# Patient Record
Sex: Female | Born: 1955 | Hispanic: No
Health system: Southern US, Community
[De-identification: ages and names within clinical notes are randomized; demographics above are authoritative.]

## PROBLEM LIST (undated history)

## (undated) DIAGNOSIS — M1990 Unspecified osteoarthritis, unspecified site: Secondary | ICD-10-CM

## (undated) DIAGNOSIS — G8929 Other chronic pain: Secondary | ICD-10-CM

## (undated) DIAGNOSIS — S0300XA Dislocation of jaw, unspecified side, initial encounter: Secondary | ICD-10-CM

## (undated) DIAGNOSIS — K219 Gastro-esophageal reflux disease without esophagitis: Secondary | ICD-10-CM

## (undated) DIAGNOSIS — I671 Cerebral aneurysm, nonruptured: Secondary | ICD-10-CM

## (undated) DIAGNOSIS — E7800 Pure hypercholesterolemia, unspecified: Secondary | ICD-10-CM

## (undated) DIAGNOSIS — I639 Cerebral infarction, unspecified: Secondary | ICD-10-CM

## (undated) DIAGNOSIS — I219 Acute myocardial infarction, unspecified: Secondary | ICD-10-CM

## (undated) HISTORY — PX: CEREBRAL ANEURYSM REPAIR: SHX164

---

## 2004-10-14 ENCOUNTER — Emergency Department: Payer: Self-pay | Admitting: Internal Medicine

## 2004-11-26 ENCOUNTER — Other Ambulatory Visit: Payer: Self-pay

## 2004-11-26 ENCOUNTER — Emergency Department: Payer: Self-pay | Admitting: Emergency Medicine

## 2005-01-05 ENCOUNTER — Ambulatory Visit: Payer: Self-pay

## 2005-03-30 ENCOUNTER — Ambulatory Visit: Payer: Self-pay | Admitting: Obstetrics and Gynecology

## 2005-04-05 ENCOUNTER — Inpatient Hospital Stay: Payer: Self-pay | Admitting: Obstetrics and Gynecology

## 2006-05-16 ENCOUNTER — Emergency Department: Payer: Self-pay | Admitting: Emergency Medicine

## 2006-06-09 ENCOUNTER — Emergency Department: Payer: Self-pay | Admitting: Emergency Medicine

## 2006-08-11 ENCOUNTER — Ambulatory Visit: Payer: Self-pay | Admitting: Unknown Physician Specialty

## 2007-01-18 ENCOUNTER — Emergency Department: Payer: Self-pay | Admitting: Internal Medicine

## 2007-01-18 ENCOUNTER — Other Ambulatory Visit: Payer: Self-pay

## 2007-04-13 ENCOUNTER — Emergency Department: Payer: Self-pay | Admitting: Unknown Physician Specialty

## 2007-05-16 ENCOUNTER — Ambulatory Visit: Payer: Self-pay | Admitting: Family Medicine

## 2007-05-25 ENCOUNTER — Ambulatory Visit: Payer: Self-pay | Admitting: Family Medicine

## 2008-04-17 ENCOUNTER — Ambulatory Visit: Payer: Self-pay | Admitting: General Surgery

## 2008-05-16 ENCOUNTER — Emergency Department: Payer: Self-pay | Admitting: Emergency Medicine

## 2008-09-16 ENCOUNTER — Emergency Department: Payer: Self-pay | Admitting: Emergency Medicine

## 2008-10-11 ENCOUNTER — Emergency Department: Payer: Self-pay | Admitting: Emergency Medicine

## 2008-10-13 ENCOUNTER — Emergency Department: Payer: Self-pay | Admitting: Emergency Medicine

## 2009-04-22 ENCOUNTER — Emergency Department: Payer: Self-pay | Admitting: Emergency Medicine

## 2009-06-21 ENCOUNTER — Emergency Department: Payer: Self-pay | Admitting: Emergency Medicine

## 2010-01-27 ENCOUNTER — Ambulatory Visit: Payer: Self-pay | Admitting: Family Medicine

## 2010-03-18 ENCOUNTER — Ambulatory Visit: Payer: Self-pay | Admitting: Gastroenterology

## 2010-03-23 ENCOUNTER — Encounter: Payer: Self-pay | Admitting: Internal Medicine

## 2010-03-27 ENCOUNTER — Encounter: Payer: Self-pay | Admitting: Internal Medicine

## 2010-04-10 ENCOUNTER — Emergency Department: Payer: Self-pay | Admitting: Emergency Medicine

## 2010-04-26 ENCOUNTER — Encounter: Payer: Self-pay | Admitting: Internal Medicine

## 2010-05-27 ENCOUNTER — Encounter: Payer: Self-pay | Admitting: Internal Medicine

## 2011-02-10 ENCOUNTER — Ambulatory Visit: Payer: Self-pay | Admitting: Family Medicine

## 2012-05-02 ENCOUNTER — Ambulatory Visit: Payer: Self-pay | Admitting: Internal Medicine

## 2012-12-02 ENCOUNTER — Ambulatory Visit: Payer: Self-pay | Admitting: Internal Medicine

## 2012-12-02 LAB — VANCOMYCIN, TROUGH: Vancomycin, Trough: <1 ug/mL — ABNORMAL LOW (ref 10–20)

## 2013-06-15 ENCOUNTER — Ambulatory Visit: Payer: Self-pay | Admitting: Internal Medicine

## 2013-06-22 ENCOUNTER — Emergency Department: Payer: Self-pay | Admitting: Emergency Medicine

## 2014-08-15 ENCOUNTER — Ambulatory Visit: Payer: Self-pay | Admitting: Internal Medicine

## 2015-03-13 ENCOUNTER — Emergency Department: Payer: Self-pay | Admitting: Emergency Medicine

## 2015-12-25 ENCOUNTER — Other Ambulatory Visit: Payer: Self-pay | Admitting: Internal Medicine

## 2015-12-25 DIAGNOSIS — Z1231 Encounter for screening mammogram for malignant neoplasm of breast: Secondary | ICD-10-CM

## 2016-01-06 ENCOUNTER — Ambulatory Visit: Payer: Self-pay | Attending: Internal Medicine

## 2016-01-23 ENCOUNTER — Ambulatory Visit
Admission: RE | Admit: 2016-01-23 | Discharge: 2016-01-23 | Disposition: A | Discharge status: Home / Self Care | Payer: Medicare Other | Source: Ambulatory Visit | Attending: Internal Medicine | Admitting: Internal Medicine

## 2016-01-23 DIAGNOSIS — Z1231 Encounter for screening mammogram for malignant neoplasm of breast: Secondary | ICD-10-CM

## 2016-12-14 ENCOUNTER — Other Ambulatory Visit: Payer: Self-pay | Admitting: Internal Medicine

## 2016-12-14 DIAGNOSIS — Z1231 Encounter for screening mammogram for malignant neoplasm of breast: Secondary | ICD-10-CM

## 2017-01-24 ENCOUNTER — Ambulatory Visit
Admission: RE | Admit: 2017-01-24 | Discharge: 2017-01-24 | Disposition: A | Discharge status: Home / Self Care | Payer: Medicare Other | Source: Ambulatory Visit | Attending: Internal Medicine | Admitting: Internal Medicine

## 2017-01-24 DIAGNOSIS — Z1231 Encounter for screening mammogram for malignant neoplasm of breast: Secondary | ICD-10-CM | POA: Diagnosis present

## 2017-08-23 ENCOUNTER — Emergency Department
Admission: EM | Admit: 2017-08-23 | Discharge: 2017-08-23 | Disposition: A | Discharge status: Home / Self Care | Payer: Medicare Other | Attending: Emergency Medicine | Admitting: Emergency Medicine

## 2017-08-23 ENCOUNTER — Emergency Department: Payer: Medicare Other

## 2017-08-23 ENCOUNTER — Encounter: Payer: Self-pay | Admitting: Emergency Medicine

## 2017-08-23 DIAGNOSIS — M79662 Pain in left lower leg: Secondary | ICD-10-CM | POA: Diagnosis present

## 2017-08-23 DIAGNOSIS — M79605 Pain in left leg: Secondary | ICD-10-CM

## 2017-08-23 DIAGNOSIS — M5432 Sciatica, left side: Secondary | ICD-10-CM | POA: Insufficient documentation

## 2017-08-23 HISTORY — DX: Pure hypercholesterolemia, unspecified: E78.00

## 2017-08-23 HISTORY — DX: Cerebral infarction, unspecified: I63.9

## 2017-08-23 HISTORY — DX: Cerebral aneurysm, nonruptured: I67.1

## 2017-08-23 HISTORY — DX: Other chronic pain: G89.29

## 2017-08-23 HISTORY — DX: Dislocation of jaw, unspecified side, initial encounter: S03.00XA

## 2017-08-23 HISTORY — DX: Unspecified osteoarthritis, unspecified site: M19.90

## 2017-08-23 MED ORDER — HYDROCODONE-ACETAMINOPHEN 5-325 MG PO TABS: 1.0000 | ORAL_TABLET | Freq: Four times a day (QID) | ORAL | 0 refills | Status: DC | PRN

## 2017-08-23 MED ORDER — PREDNISONE 10 MG (21) PO TBPK: ORAL_TABLET | ORAL | 0 refills | Status: DC

## 2017-08-23 MED ORDER — DEXAMETHASONE SODIUM PHOSPHATE 10 MG/ML IJ SOLN
10.0000 mg | Freq: Once | INTRAMUSCULAR | Status: AC
  Administered 2017-08-23: 10 mg via INTRAMUSCULAR
  Filled 2017-08-23: qty 1

## 2017-08-23 NOTE — ED Notes (Signed)
Pt alert and oriented X4, active, cooperative, pt in NAD. RR even and unlabored, color WNL.  Pt informed to return if any life threatening symptoms occur.   

## 2017-08-23 NOTE — Discharge Instructions (Signed)
Take medication as prescribed. Return to emergency department if symptoms worsen and follow-up with PCP as needed.   °

## 2017-08-23 NOTE — ED Triage Notes (Signed)
Pt c/o left knee/foot pain.  Has chronic pain to this area and missed appt in [place] r/t transportation.  Pain has been worse than normal for last 10 days.  Has "degnerative joint problems in this leg" per pt.  Ambulatory, declined wheelchair.  Pain is worse when walking.

## 2017-08-23 NOTE — ED Provider Notes (Signed)
[facility] Emergency Department Provider Note   ____________________________________________   I have reviewed the triage vital signs and the nursing notes.   HISTORY  Chief Complaint Leg Pain    HPI Hannah Martinez is a 61 y.o. female presents to the emergency department with left upper extremity pain beginning at the knee radiating down to the foot that began approximately 10 days ago. Patient is normally seen at [facility] orthopedics however. her transportation arrangements were not schedule appropriately and she missed the appointment. Reschedule appointment is for October however she was unable to wait so she came to the emergency department this morning. Patient reports pain in numbing sensation along the knee and lower leg with cramping sensation along the plantar aspect of the foot. Patient reports family history of clotting disorders and DVT. Patient has history of stroke and 2003 and does not take blood thinners or aspirin. Patient utilizes a single-point cane for mobility. Patient denies fever, chills, headache, vision changes, chest pain, chest tightness, shortness of breath, abdominal pain, nausea and vomiting.  Past Medical History:  Diagnosis Date  . Cerebral aneurysm   . Chronic pain   . Degenerative joint disease   . Hypercholesteremia   . Stroke (HCC)   . TMJ (dislocation of temporomandibular joint)     There are no active problems to display for this patient.   Past Surgical History:  Procedure Laterality Date  . CEREBRAL ANEURYSM REPAIR      Prior to Admission medications   Medication Sig Start Date End Date Taking? Authorizing Provider  HYDROcodone-acetaminophen (NORCO/VICODIN) 5-325 MG tablet Take 1 tablet by mouth every 6 (six) hours as needed for moderate pain. 08/23/17   Sanuel Ladnier M, PA-C  predniSONE (STERAPRED UNI-PAK 21 TAB) 10 MG (21) TBPK tablet Take 6 tablets on day 1. Take 5 tablets on day 2. Take 4 tablets on day 3. Take  3 tablets on day 4. Take 2 tablets on day 5. Take 1 tablets on day 6. 08/23/17   Amyri Frenz M, PA-C    Allergies Lipitor [atorvastatin]  Family History  Problem Relation Age of Onset  . Breast cancer Neg Hx     Social History Social History  Substance Use Topics  . Smoking status: Never Smoker  . Smokeless tobacco: Never Used  . Alcohol use No    Review of Systems Constitutional: Negative for fever/chills Eyes: No visual changes. ENT:  Negative for sore throat and for difficulty swallowing Cardiovascular: Denies chest pain. Respiratory: Denies cough. Denies shortness of breath. Gastrointestinal: No abdominal pain.  No nausea, vomiting, diarrhea. Genitourinary: Negative for dysuria. Musculoskeletal: Positive for left knee and lower leg pain radiating to the plantar aspect of the foot with cramping along the plantar aspect of the foot. Skin: Negative for rash. Neurological: Negative for headaches.  Negative focal weakness or numbness. Negative for loss of consciousness. Able to ambulate. ____________________________________________   PHYSICAL EXAM:  VITAL SIGNS: ED Triage Vitals  Enc Vitals Group     BP 08/23/17 0921 (!) 158/74     Pulse Rate 08/23/17 0921 64     Resp 08/23/17 0921 16     Temp 08/23/17 0918 97.7 F (36.5 C)     Temp Source 08/23/17 0918 Oral     SpO2 08/23/17 0921 99 %     Weight 08/23/17 0919 155 lb (70.3 kg)     Height 08/23/17 0919 5\' 5"  (1.651 m)     Head Circumference --  Peak Flow --      Pain Score 08/23/17 0918 10     Pain Loc --      Pain Edu? --      Excl. in GC? --     Constitutional: Alert and oriented. Well appearing and in no acute distress.  Eyes: Conjunctivae are normal. PERRL. EOMI  Head: Normocephalic and atraumatic. ENT:      Ears: Canals clear. TMs intact bilaterally.      Nose: No congestion/rhinnorhea.      Mouth/Throat: Mucous membranes are moist.  Neck:Supple. No thyromegaly. No stridor.  Cardiovascular:  Normal rate, regular rhythm. Normal S1 and S2.  Good peripheral circulation. Respiratory: Normal respiratory effort without tachypnea or retractions. Lungs CTAB. No wheezes/rales/rhonchi. Good air entry to the bases with no decreased or absent breath sounds. Hematological/Lymphatic/Immunological: No cervical lymphadenopathy. Cardiovascular: Normal rate, regular rhythm. Normal distal pulses. Gastrointestinal: Bowel sounds 4 quadrants. Soft and nontender to palpation.  Musculoskeletal: Left lower leg pain from left knee to the foot. Intact sensation however altered. Patient reported sharp/dull sensation assessment, the left lower leg has less sensation compared to the right lower leg. Intact strength of the left lower extremity and no gait instability.  Neurologic: Normal speech and language. No gross focal neurologic deficits are appreciated. No gait instability. .  Skin:  Skin is warm, dry and intact. No rash noted. Psychiatric: Mood and affect are normal. Speech and behavior are normal. Patient exhibits appropriate insight and judgement.  ____________________________________________   LABS (all labs ordered are listed, but only abnormal results are displayed)  Labs Reviewed - No data to display ____________________________________________  EKG none ____________________________________________  RADIOLOGY US venous IMG lower unilateral left IMPRESSION: No evidence of DVT within the LEFT lower extremity. ____________________________________________   PROCEDURES  Procedure(s) performed: no    Critical Care performed: no ____________________________________________   INITIAL IMPRESSION / ASSESSMENT AND PLAN / ED COURSE  Pertinent labs & imaging results that were available during my care of the patient were reviewed by me and considered in my medical decision making (see chart for details).  Patient presents to emergency department with left lower leg pain staining from the  left knee to the foot that has persisted for 10 day.Marland Kitchen History, physical exam findings and imaging are reassuring pain is not associated with a DVT. Patient complaint likely associated with nerve impingement or neural irritation likely associated with sciatic symptoms. Patient responded well decadron during the course of care in the emergency department. Patient will be prescribed prednisone taper and short course of vicodin. Patient advised to follow up with PCP as needed or return to the emergency department if symptoms return or worsen. Patient informed of clinical course, understand medical decision-making process, and agree with plan. ____________________________________________   FINAL CLINICAL IMPRESSION(S) / ED DIAGNOSES  Final diagnoses:  Left leg pain  Sciatica of left side       NEW MEDICATIONS STARTED DURING THIS VISIT:  Discharge Medication List as of 08/23/2017 12:01 PM    START taking these medications   Details  HYDROcodone-acetaminophen (NORCO/VICODIN) 5-325 MG tablet Take 1 tablet by mouth every 6 (six) hours as needed for moderate pain., Starting Tue 08/23/2017, Print    predniSONE (STERAPRED UNI-PAK 21 TAB) 10 MG (21) TBPK tablet Take 6 tablets on day 1. Take 5 tablets on day 2. Take 4 tablets on day 3. Take 3 tablets on day 4. Take 2 tablets on day 5. Take 1 tablets on day 6., Print  Note:  This document was prepared using Dragon voice recognition software and may include unintentional dictation errors.    Percell Boston 08/23/17 1555    Emily Filbert, MD 08/26/17 [phone]

## 2017-08-23 NOTE — ED Notes (Signed)
Left leg cramping X 2 weeks. Pt hx of left knee pain, wears brace and orthopedic brace. She reports that she has cracking to posterior foot under base of dose, causing pain. Has used "cream" with no relief. Pain and cramping resulting in loss of sleep. Pt has ortho appt in October. Pt alert and oriented X4, active, cooperative, pt in NAD. RR even and unlabored, color WNL.

## 2017-11-26 ENCOUNTER — Emergency Department
Admission: EM | Admit: 2017-11-26 | Discharge: 2017-11-26 | Disposition: A | Discharge status: Home / Self Care | Payer: Medicare Other | Attending: Emergency Medicine | Admitting: Emergency Medicine

## 2017-11-26 ENCOUNTER — Other Ambulatory Visit: Payer: Self-pay

## 2017-11-26 ENCOUNTER — Encounter: Payer: Self-pay | Admitting: Emergency Medicine

## 2017-11-26 DIAGNOSIS — B353 Tinea pedis: Secondary | ICD-10-CM | POA: Diagnosis not present

## 2017-11-26 DIAGNOSIS — M79672 Pain in left foot: Secondary | ICD-10-CM | POA: Diagnosis present

## 2017-11-26 MED ORDER — TERBINAFINE HCL 1 % EX CREA: 1.0000 "application " | TOPICAL_CREAM | Freq: Two times a day (BID) | CUTANEOUS | 0 refills | Status: DC

## 2017-11-26 MED ORDER — HYDROCODONE-ACETAMINOPHEN 5-325 MG PO TABS: 1.0000 | ORAL_TABLET | Freq: Four times a day (QID) | ORAL | 0 refills | Status: DC | PRN

## 2017-11-26 NOTE — ED Provider Notes (Signed)
[facility] Emergency Department Provider Note   ____________________________________________   First MD Initiated Contact with Patient 11/26/17 [phone]     (approximate)  I have reviewed the triage vital signs and the nursing notes.   HISTORY  Chief Complaint Foot Pain    HPI Hannah Martinez is a 61 y.o. female here for evaluation of left foot and toe pain  Patient reports for about 2-3 weeks to be experiencing pain and burning discomfort at the bottom of the toes of her left foot.  She is noticed that the skin between her toes is cracked and dry.  She saw her doctor who advised her to stop using frequent soaks of her feet.  She denies that the foot feels numb.  She does have chronic weakness in the left arm and left leg secondary previous aneurysm.  No nausea or vomiting.  No fevers or chills.  No redness.  She has not noticed any increased weakness or trouble using the foot other than she prefers to walk on the heel now because of the discomfort and burning feeling at the base of her toes.  Currently not taking any pain medications.  Past Medical History:  Diagnosis Date  . Cerebral aneurysm   . Chronic pain   . Degenerative joint disease   . Hypercholesteremia   . Stroke (HCC)   . TMJ (dislocation of temporomandibular joint)     There are no active problems to display for this patient.   Past Surgical History:  Procedure Laterality Date  . CEREBRAL ANEURYSM REPAIR      Prior to Admission medications   Medication Sig Start Date End Date Taking? Authorizing Provider  HYDROcodone-acetaminophen (NORCO/VICODIN) 5-325 MG tablet Take 1 tablet by mouth every 6 (six) hours as needed for moderate pain. 11/26/17   Sharyn CreamerQuale, Mark, MD  terbinafine (LAMISIL AT) 1 % cream Apply 1 application topically 2 (two) times daily. 11/26/17   Sharyn CreamerQuale, Mark, MD    Allergies Lipitor [atorvastatin]  Family History  Problem Relation Age of Onset  . Breast cancer Neg Hx      Social History Social History   Tobacco Use  . Smoking status: Never Smoker  . Smokeless tobacco: Never Used  Substance Use Topics  . Alcohol use: No  . Drug use: No    Review of Systems Constitutional: No fever/chills Eyes: No visual changes. ENT: No sore throat. Cardiovascular: Denies chest pain. Respiratory: Denies shortness of breath. Gastrointestinal: No abdominal pain.  No nausea, no vomiting.  No diarrhea.  No constipation. Genitourinary: Negative for dysuria. Musculoskeletal: Negative for back pain. Skin: Negative for rash except as noted in HPI. Neurological: Negative for headaches.  Chronic weakness left arm and left leg   ____________________________________________   PHYSICAL EXAM:  VITAL SIGNS: ED Triage Vitals  Enc Vitals Group     BP 11/26/17 0710 (!) 118/92     Pulse Rate 11/26/17 0710 85     Resp 11/26/17 0710 18     Temp 11/26/17 0710 98 F (36.7 C)     Temp Source 11/26/17 0710 Oral     SpO2 11/26/17 0710 100 %     Weight 11/26/17 0710 154 lb (69.9 kg)     Height 11/26/17 0710 5\' 5"  (1.651 m)     Head Circumference --      Peak Flow --      Pain Score 11/26/17 0709 10     Pain Loc --      Pain Edu? --  Excl. in GC? --     Constitutional: Alert and oriented. Well appearing and in no acute distress. Eyes: Conjunctivae are normal. Head: Atraumatic. Nose: No congestion/rhinnorhea. Mouth/Throat: Mucous membranes are moist. Neck: No stridor.   Cardiovascular: Normal rate, regular rhythm.  Respiratory: Normal respiratory effort.  No retractions. Lungs CTAB. Gastrointestinal: Soft and nontender. No distention. Musculoskeletal:   Lower Extremities  No edema. Normal DP/PT pulses bilateral with good cap refill.  Normal neuro-motor function lower extremities bilateral.  RIGHT Right lower extremity demonstrates normal strength, good use of all muscles. No edema bruising or contusions of the right hip, right knee, right ankle. Full  range of motion of the right lower extremity without pain. No pain on axial loading. No evidence of trauma.  LEFT Left lower extremity demonstrates slightly reduced strength strength, good use of all muscles. No edema bruising or contusions of the hip,  knee, ankle. Full range of motion of the left lower extremity without pain. No pain on axial loading. No evidence of trauma.  The web spaces between the toes and left foot demonstrate that they are dry cracked, and slightly abnormal odor is smelled.  She reports a burning sensation in that area.  Additionally, patient is noted to have onychomycosis of the nail beds of both feet, but she reports this is been chronic.  She reports her nailbeds of all eyes look very dark.  She has normally capillary refill in the toes of the left foot.  She has strong and palpable dorsalis pedis and posterior tibial pulses.   Neurologic:  Normal speech and language. No gross focal neurologic deficits are appreciated.  Skin:  Skin is warm, dry and intact. No rash noted. Psychiatric: Mood and affect are normal. Speech and behavior are normal.  ____________________________________________   LABS (all labs ordered are listed, but only abnormal results are displayed)  Labs Reviewed - No data to display ____________________________________________  EKG   ____________________________________________  RADIOLOGY   ____________________________________________   PROCEDURES  Procedure(s) performed: None  Procedures  Critical Care performed: No  ____________________________________________   INITIAL IMPRESSION / ASSESSMENT AND PLAN / ED COURSE  Pertinent labs & imaging results that were available during my care of the patient were reviewed by me and considered in my medical decision making (see chart for details).  Consistent with tinea pedis.  Likely the culprit of her burning discomfort and pain between her web space of her toes that she complains of  today.  There is no evidence of ischemia or superinfection. Left lower extremity.  No crepitance.  No erythema.  Return precautions and treatment recommendations and follow-up discussed with the patient who is agreeable with the plan.       ____________________________________________   FINAL CLINICAL IMPRESSION(S) / ED DIAGNOSES  Final diagnoses:  Tinea pedis of left foot      NEW MEDICATIONS STARTED DURING THIS VISIT:  This SmartLink is deprecated. Use AVSMEDLIST instead to display the medication list for a patient.   Note:  This document was prepared using Dragon voice recognition software and may include unintentional dictation errors.     Sharyn CreamerQuale, Mark, MD 11/26/17 [phone]

## 2017-11-26 NOTE — Discharge Instructions (Signed)
Please follow-up closely with your primary doctor.  Return to the emergency room right away if you develop swelling in the leg, increasing pain, redness, drainage, fever, unable to walk on the foot, or if you notice a cold blue or numb foot or toes.

## 2017-11-26 NOTE — ED Triage Notes (Signed)
L foot pain since yesterday. Denies injury. States CVA 15 years ago with weakness on that side. Great toe nailbed dark. States foot has been colder than R though feels warm at present.

## 2017-11-26 NOTE — ED Notes (Signed)
Patient does not appear to be in any acute distress at time of discharge. Patient ambulatory to lobby with steady gate. Patient denies any comments or concerns regarding discharge.  

## 2017-12-22 ENCOUNTER — Other Ambulatory Visit: Payer: Self-pay | Admitting: Internal Medicine

## 2017-12-22 DIAGNOSIS — Z1231 Encounter for screening mammogram for malignant neoplasm of breast: Secondary | ICD-10-CM

## 2018-01-25 ENCOUNTER — Ambulatory Visit
Admission: RE | Admit: 2018-01-25 | Discharge: 2018-01-25 | Disposition: A | Discharge status: Home / Self Care | Payer: Medicare Other | Source: Ambulatory Visit | Attending: Internal Medicine | Admitting: Internal Medicine

## 2018-01-25 DIAGNOSIS — Z1231 Encounter for screening mammogram for malignant neoplasm of breast: Secondary | ICD-10-CM | POA: Diagnosis not present

## 2019-01-01 ENCOUNTER — Other Ambulatory Visit: Payer: Self-pay | Admitting: Internal Medicine

## 2019-01-01 DIAGNOSIS — Z1231 Encounter for screening mammogram for malignant neoplasm of breast: Secondary | ICD-10-CM

## 2019-03-08 ENCOUNTER — Ambulatory Visit
Admission: RE | Admit: 2019-03-08 | Discharge: 2019-03-08 | Disposition: A | Discharge status: Home / Self Care | Payer: Medicare Other | Source: Ambulatory Visit | Attending: Internal Medicine | Admitting: Internal Medicine

## 2019-03-08 ENCOUNTER — Other Ambulatory Visit: Payer: Self-pay

## 2019-03-08 DIAGNOSIS — Z1231 Encounter for screening mammogram for malignant neoplasm of breast: Secondary | ICD-10-CM | POA: Insufficient documentation

## 2019-11-22 ENCOUNTER — Emergency Department: Payer: Medicare Other

## 2019-11-22 ENCOUNTER — Emergency Department
Admission: EM | Admit: 2019-11-22 | Discharge: 2019-11-22 | Disposition: A | Discharge status: Home / Self Care | Payer: Medicare Other | Attending: Emergency Medicine | Admitting: Emergency Medicine

## 2019-11-22 ENCOUNTER — Other Ambulatory Visit: Payer: Self-pay

## 2019-11-22 DIAGNOSIS — I679 Cerebrovascular disease, unspecified: Secondary | ICD-10-CM | POA: Diagnosis not present

## 2019-11-22 DIAGNOSIS — I10 Essential (primary) hypertension: Secondary | ICD-10-CM | POA: Diagnosis present

## 2019-11-22 DIAGNOSIS — Z79899 Other long term (current) drug therapy: Secondary | ICD-10-CM | POA: Insufficient documentation

## 2019-11-22 LAB — CBC
HCT: 45 % (ref 36.0–46.0)
Hemoglobin: 15 g/dL (ref 12.0–15.0)
MCH: 33.3 pg (ref 26.0–34.0)
MCHC: 33.3 g/dL (ref 30.0–36.0)
MCV: 99.8 fL (ref 80.0–100.0)
Platelets: 202 10*3/uL (ref 150–400)
RBC: 4.51 MIL/uL (ref 3.87–5.11)
RDW: 11.9 % (ref 11.5–15.5)
WBC: 4.5 10*3/uL (ref 4.0–10.5)
nRBC: 0 % (ref 0.0–0.2)

## 2019-11-22 LAB — URINALYSIS, COMPLETE (UACMP) WITH MICROSCOPIC
Bacteria, UA: NONE SEEN
Bilirubin Urine: NEGATIVE
Glucose, UA: NEGATIVE mg/dL
Hgb urine dipstick: NEGATIVE
Ketones, ur: NEGATIVE mg/dL
Leukocytes,Ua: NEGATIVE
Nitrite: NEGATIVE
Protein, ur: NEGATIVE mg/dL
Specific Gravity, Urine: 1.017 (ref 1.005–1.030)
pH: 6 (ref 5.0–8.0)

## 2019-11-22 LAB — BASIC METABOLIC PANEL
Anion gap: 9 (ref 5–15)
BUN: 10 mg/dL (ref 8–23)
CO2: 24 mmol/L (ref 22–32)
Calcium: 9.4 mg/dL (ref 8.9–10.3)
Chloride: 106 mmol/L (ref 98–111)
Creatinine, Ser: 0.73 mg/dL (ref 0.44–1.00)
GFR calc Af Amer: >60 mL/min (ref >60)
GFR calc non Af Amer: >60 mL/min (ref >60)
Glucose, Bld: 92 mg/dL (ref 70–99)
Potassium: 3.7 mmol/L (ref 3.5–5.1)
Sodium: 139 mmol/L (ref 135–145)

## 2019-11-22 LAB — TROPONIN I (HIGH SENSITIVITY)
Troponin I (High Sensitivity): 23 ng/L — ABNORMAL HIGH (ref <18)
Troponin I (High Sensitivity): 20 ng/L — ABNORMAL HIGH (ref <18)

## 2019-11-22 MED ORDER — SODIUM CHLORIDE 0.9% FLUSH: 3.0000 mL | Freq: Once | INTRAVENOUS | Status: DC

## 2019-11-22 NOTE — ED Notes (Signed)
Pt given warm blanket.

## 2019-11-22 NOTE — ED Triage Notes (Signed)
Reports dizziness and high blood pressure that resolved last night. States she awoke today with high blood pressure today but feels fine, denies dizziness/cp or sob. Pt does not take blood pressure medications, no hx of diagnosis. Pt alert and oriented X4, cooperative, RR even and unlabored, color WNL. Pt in NAD.

## 2019-11-22 NOTE — ED Provider Notes (Signed)
[facility] Emergency Department Provider Note  Time seen: 1:16 PM  I have reviewed the triage vital signs and the nursing notes.   HISTORY  Chief Complaint Hypertension   HPI Hannah Martinez is a 63 y.o. female with a past medical history of chronic pain, hyperlipidemia, prior CVA/cerebral aneurysm status post clipping presents to the emergency department for hypertension.  According to the patient she checked her blood pressure at home today and it was 150s over 90s, patient became very concerned so she came to the emergency department.  Patient denies any history of hypertension does not take any hypertensive medications.  Patient denies any chest pain headache weakness numbness.  Also denies any recent fever cough congestion or shortness of breath.  Overall the patient appears well and in no acute distress.  States she talk to her friend about her high blood pressure and she recommended she go to the ER for evaluation.   Past Medical History:  Diagnosis Date  . Cerebral aneurysm   . Chronic pain   . Degenerative joint disease   . Hypercholesteremia   . Stroke (HCC)   . TMJ (dislocation of temporomandibular joint)     There are no active problems to display for this patient.   Past Surgical History:  Procedure Laterality Date  . CEREBRAL ANEURYSM REPAIR      Prior to Admission medications   Medication Sig Start Date End Date Taking? Authorizing Provider  fluticasone (FLONASE) 50 MCG/ACT nasal spray Place 1-2 sprays into both nostrils daily as needed for allergies. 10/29/19  Yes [provider]  gabapentin (NEURONTIN) 300 MG capsule Take 300 mg by mouth 3 (three) times daily. (take each capsule with 600mg  tablet to equal 900mg  three times a day) 10/29/19  Yes [provider]  gabapentin (NEURONTIN) 600 MG tablet Take 600 mg by mouth 3 (three) times daily. (take each tablet with 300mg  capsule to equal 900mg  three times a day) 10/29/19  Yes  [provider]  meloxicam (MOBIC) 7.5 MG tablet Take 7.5 mg by mouth daily. 08/09/19  Yes [provider]  omeprazole (PRILOSEC) 20 MG capsule Take 20 mg by mouth daily. 10/29/19  Yes [provider]  simvastatin (ZOCOR) 20 MG tablet Take 20 mg by mouth at bedtime. 10/29/19  Yes [provider]  HYDROcodone-acetaminophen (NORCO/VICODIN) 5-325 MG tablet Take 1 tablet by mouth every 6 (six) hours as needed for moderate pain. 11/26/17   08/11/19, MD    Allergies  Allergen Reactions  . Lipitor [Atorvastatin] Hives    Family History  Problem Relation Age of Onset  . Breast cancer Neg Hx     Social History Social History   Tobacco Use  . Smoking status: Never Smoker  . Smokeless tobacco: Never Used  Substance Use Topics  . Alcohol use: No  . Drug use: No    Review of Systems Constitutional: Negative for fever. Cardiovascular: Negative for chest pain. Respiratory: Negative for shortness of breath. Gastrointestinal: Negative for abdominal pai Musculoskeletal: Negative for musculoskeletal complaints Neurological: Negative for headache All other ROS negative  ____________________________________________   PHYSICAL EXAM:  VITAL SIGNS: ED Triage Vitals [11/22/19 1044]  Enc Vitals Group     BP (!) 139/96     Pulse Rate 79     Resp 18     Temp 99.1 F (37.3 C)     Temp Source Oral     SpO2 99 %     Weight 155 lb (70.3 kg)  Height 5\' 5"  (1.651 m)     Head Circumference      Peak Flow      Pain Score 0     Pain Loc      Pain Edu?      Excl. in [place]?    Constitutional: Alert and oriented. Well appearing and in no distress. Eyes: Normal exam ENT      Head: Normocephalic and atraumatic.      Mouth/Throat: Mucous membranes are moist. Cardiovascular: Normal rate, regular rhythm. Respiratory: Normal respiratory effort without tachypnea nor retractions. Breath sounds are clear  Gastrointestinal: Soft and nontender. No distention.   Musculoskeletal: Nontender with normal range of motion in all extremities.  No edema. Neurologic:  Normal speech and language. No gross focal neurologic deficits Skin:  Skin is warm, dry and intact.  Psychiatric: Mood and affect are normal. Speech and behavior are normal.   ____________________________________________    EKG  EKG viewed and interpreted by myself shows a normal sinus rhythm at 69 bpm with a narrow QRS, normal axis, normal intervals, no concerning ST changes.  ____________________________________________    RADIOLOGY  IMPRESSION:  No acute abnormality.   Prior aneurysm clipping right MCA region with chronic  encephalomalacia right MCA territory which is stable from prior  studies.   Chest x-ray negative  ____________________________________________   INITIAL IMPRESSION / ASSESSMENT AND PLAN / ED COURSE  Pertinent labs & imaging results that were available during my care of the patient were reviewed by me and considered in my medical decision making (see chart for details).   Patient presents to the emergency department concerned over high blood pressure 150s over 90s at home.  Currently 139/96.  Otherwise the patient appears very well, reassuring physical exam reassuring lab work besides a slight troponin elevation.  We will repeat a troponin as a precaution.  No concerning ST changes.  No complaints of chest pain.  Lungs repeat troponin is largely unchanged I believe the patient would be safe for discharge home with PCP follow-up.  Patient agreeable to plan of care.  Patient's repeat troponin is largely unchanged/declining.  Overall the patient appears well.  We will discharge patient home with PCP follow-up.  Patient agreeable to plan of care.  Hannah Martinez was evaluated in Emergency Department on 11/22/2019 for the symptoms described in the history of present illness. She was evaluated in the context of the global COVID-19 pandemic, which necessitated  consideration that the patient might be at risk for infection with the SARS-CoV-2 virus that causes COVID-19. Institutional protocols and algorithms that pertain to the evaluation of patients at risk for COVID-19 are in a state of rapid change based on information released by regulatory bodies including the CDC and federal and state organizations. These policies and algorithms were followed during the patient's care in the ED.  ____________________________________________   FINAL CLINICAL IMPRESSION(S) / ED DIAGNOSES  Hypertension   Harvest Dark, MD 11/22/19 1422

## 2019-11-22 NOTE — ED Notes (Signed)
Pt assisted to toilet to urinate. Pt back to bed and placed back on cardiac monitor. Call light within reach. Pt has no further needs at this time.

## 2019-11-22 NOTE — ED Notes (Signed)
Esign not available at this time. Pt verbalized discharge instructions and has no questions at this time. Pt ambulatory with steady gait.

## 2019-11-22 NOTE — ED Notes (Signed)
EKG done, will draw rainbow.

## 2020-01-30 ENCOUNTER — Other Ambulatory Visit: Payer: Self-pay | Admitting: Internal Medicine

## 2020-01-30 DIAGNOSIS — Z1231 Encounter for screening mammogram for malignant neoplasm of breast: Secondary | ICD-10-CM

## 2020-02-12 ENCOUNTER — Ambulatory Visit: Payer: Self-pay

## 2020-02-12 ENCOUNTER — Ambulatory Visit: Payer: 59 | Attending: Internal Medicine

## 2020-02-12 DIAGNOSIS — Z20822 Contact with and (suspected) exposure to covid-19: Secondary | ICD-10-CM

## 2020-02-13 LAB — NOVEL CORONAVIRUS, NAA: SARS-CoV-2, NAA: NOT DETECTED

## 2020-02-24 ENCOUNTER — Inpatient Hospital Stay
Admission: EM | Admit: 2020-02-24 | Discharge: 2020-02-27 | DRG: 280 | Disposition: A | Discharge status: Home / Self Care | Payer: Medicare Other | Attending: Internal Medicine | Admitting: Internal Medicine

## 2020-02-24 ENCOUNTER — Inpatient Hospital Stay: Payer: Medicare Other

## 2020-02-24 ENCOUNTER — Other Ambulatory Visit: Payer: Self-pay

## 2020-02-24 ENCOUNTER — Emergency Department: Payer: Medicare Other

## 2020-02-24 DIAGNOSIS — R079 Chest pain, unspecified: Secondary | ICD-10-CM

## 2020-02-24 DIAGNOSIS — I5043 Acute on chronic combined systolic (congestive) and diastolic (congestive) heart failure: Secondary | ICD-10-CM | POA: Diagnosis present

## 2020-02-24 DIAGNOSIS — E876 Hypokalemia: Secondary | ICD-10-CM | POA: Diagnosis present

## 2020-02-24 DIAGNOSIS — I2109 ST elevation (STEMI) myocardial infarction involving other coronary artery of anterior wall: Principal | ICD-10-CM | POA: Diagnosis present

## 2020-02-24 DIAGNOSIS — Z79899 Other long term (current) drug therapy: Secondary | ICD-10-CM

## 2020-02-24 DIAGNOSIS — R778 Other specified abnormalities of plasma proteins: Secondary | ICD-10-CM

## 2020-02-24 DIAGNOSIS — I214 Non-ST elevation (NSTEMI) myocardial infarction: Secondary | ICD-10-CM

## 2020-02-24 DIAGNOSIS — I639 Cerebral infarction, unspecified: Secondary | ICD-10-CM | POA: Diagnosis present

## 2020-02-24 DIAGNOSIS — Z8673 Personal history of transient ischemic attack (TIA), and cerebral infarction without residual deficits: Secondary | ICD-10-CM

## 2020-02-24 DIAGNOSIS — I5021 Acute systolic (congestive) heart failure: Secondary | ICD-10-CM | POA: Diagnosis present

## 2020-02-24 DIAGNOSIS — R197 Diarrhea, unspecified: Secondary | ICD-10-CM

## 2020-02-24 DIAGNOSIS — R112 Nausea with vomiting, unspecified: Secondary | ICD-10-CM | POA: Diagnosis not present

## 2020-02-24 DIAGNOSIS — E785 Hyperlipidemia, unspecified: Secondary | ICD-10-CM | POA: Diagnosis present

## 2020-02-24 DIAGNOSIS — Z791 Long term (current) use of non-steroidal anti-inflammatories (NSAID): Secondary | ICD-10-CM

## 2020-02-24 DIAGNOSIS — R0789 Other chest pain: Secondary | ICD-10-CM | POA: Diagnosis not present

## 2020-02-24 DIAGNOSIS — K219 Gastro-esophageal reflux disease without esophagitis: Secondary | ICD-10-CM | POA: Diagnosis present

## 2020-02-24 DIAGNOSIS — G8929 Other chronic pain: Secondary | ICD-10-CM | POA: Diagnosis present

## 2020-02-24 DIAGNOSIS — E7800 Pure hypercholesterolemia, unspecified: Secondary | ICD-10-CM | POA: Diagnosis present

## 2020-02-24 DIAGNOSIS — I272 Pulmonary hypertension, unspecified: Secondary | ICD-10-CM | POA: Diagnosis present

## 2020-02-24 DIAGNOSIS — I2510 Atherosclerotic heart disease of native coronary artery without angina pectoris: Secondary | ICD-10-CM | POA: Diagnosis present

## 2020-02-24 DIAGNOSIS — I255 Ischemic cardiomyopathy: Secondary | ICD-10-CM | POA: Diagnosis present

## 2020-02-24 DIAGNOSIS — Z20822 Contact with and (suspected) exposure to covid-19: Secondary | ICD-10-CM | POA: Diagnosis present

## 2020-02-24 DIAGNOSIS — B349 Viral infection, unspecified: Secondary | ICD-10-CM | POA: Insufficient documentation

## 2020-02-24 LAB — BASIC METABOLIC PANEL
Anion gap: 9 (ref 5–15)
BUN: 8 mg/dL (ref 8–23)
CO2: 20 mmol/L — ABNORMAL LOW (ref 22–32)
Calcium: 8.7 mg/dL — ABNORMAL LOW (ref 8.9–10.3)
Chloride: 111 mmol/L (ref 98–111)
Creatinine, Ser: 0.87 mg/dL (ref 0.44–1.00)
GFR calc Af Amer: >60 mL/min (ref >60)
GFR calc non Af Amer: >60 mL/min (ref >60)
Glucose, Bld: 109 mg/dL — ABNORMAL HIGH (ref 70–99)
Potassium: 3.4 mmol/L — ABNORMAL LOW (ref 3.5–5.1)
Sodium: 140 mmol/L (ref 135–145)

## 2020-02-24 LAB — URINE DRUG SCREEN, QUALITATIVE (ARMC ONLY)
Amphetamines, Ur Screen: NOT DETECTED
Barbiturates, Ur Screen: NOT DETECTED
Benzodiazepine, Ur Scrn: NOT DETECTED
Cannabinoid 50 Ng, Ur ~~LOC~~: POSITIVE — AB
Cocaine Metabolite,Ur ~~LOC~~: NOT DETECTED
MDMA (Ecstasy)Ur Screen: NOT DETECTED
Methadone Scn, Ur: NOT DETECTED
Opiate, Ur Screen: NOT DETECTED
Phencyclidine (PCP) Ur S: NOT DETECTED
Tricyclic, Ur Screen: NOT DETECTED

## 2020-02-24 LAB — CBC
HCT: 38.9 % (ref 36.0–46.0)
Hemoglobin: 12.7 g/dL (ref 12.0–15.0)
MCH: 32.6 pg (ref 26.0–34.0)
MCHC: 32.6 g/dL (ref 30.0–36.0)
MCV: 99.7 fL (ref 80.0–100.0)
Platelets: 354 10*3/uL (ref 150–400)
RBC: 3.9 MIL/uL (ref 3.87–5.11)
RDW: 12.5 % (ref 11.5–15.5)
WBC: 7.4 10*3/uL (ref 4.0–10.5)
nRBC: 0 % (ref 0.0–0.2)

## 2020-02-24 LAB — RESPIRATORY PANEL BY RT PCR (FLU A&B, COVID)
Influenza A by PCR: NEGATIVE
Influenza B by PCR: NEGATIVE
SARS Coronavirus 2 by RT PCR: NEGATIVE

## 2020-02-24 LAB — APTT: aPTT: >160 seconds (ref 24–36)

## 2020-02-24 LAB — TROPONIN I (HIGH SENSITIVITY)
Troponin I (High Sensitivity): 214 ng/L (ref <18)
Troponin I (High Sensitivity): 203 ng/L (ref <18)

## 2020-02-24 LAB — HEPARIN LEVEL (UNFRACTIONATED): Heparin Unfractionated: 0.48 IU/mL (ref 0.30–0.70)

## 2020-02-24 LAB — PROTIME-INR
INR: 1.2 (ref 0.8–1.2)
Prothrombin Time: 14.7 seconds (ref 11.4–15.2)

## 2020-02-24 MED ORDER — PANTOPRAZOLE SODIUM 40 MG PO TBEC
40.0000 mg | DELAYED_RELEASE_TABLET | Freq: Every day | ORAL | Status: DC
  Administered 2020-02-25 – 2020-02-27 (×3): 40 mg via ORAL
  Filled 2020-02-24 (×3): qty 1

## 2020-02-24 MED ORDER — ALBUTEROL SULFATE (2.5 MG/3ML) 0.083% IN NEBU
2.5000 mg | INHALATION_SOLUTION | Freq: Once | RESPIRATORY_TRACT | Status: AC
  Administered 2020-02-24: 14:00:00 2.5 mg via RESPIRATORY_TRACT
  Filled 2020-02-24: qty 3

## 2020-02-24 MED ORDER — ASPIRIN 81 MG PO CHEW
324.0000 mg | CHEWABLE_TABLET | Freq: Once | ORAL | Status: AC
  Administered 2020-02-24: 324 mg via ORAL
  Filled 2020-02-24: qty 4

## 2020-02-24 MED ORDER — HEPARIN (PORCINE) 25000 UT/250ML-% IV SOLN
850.0000 [IU]/h | INTRAVENOUS | Status: DC
  Administered 2020-02-24 (×2): 850 [IU]/h via INTRAVENOUS
  Filled 2020-02-24: qty 250

## 2020-02-24 MED ORDER — PREDNISONE 20 MG PO TABS
60.0000 mg | ORAL_TABLET | Freq: Once | ORAL | Status: AC
  Administered 2020-02-24: 14:00:00 60 mg via ORAL
  Filled 2020-02-24: qty 3

## 2020-02-24 MED ORDER — ASPIRIN 81 MG PO CHEW
324.0000 mg | CHEWABLE_TABLET | Freq: Every day | ORAL | Status: DC
  Administered 2020-02-25: 324 mg via ORAL
  Filled 2020-02-24: qty 4

## 2020-02-24 MED ORDER — HYDROCODONE-ACETAMINOPHEN 5-325 MG PO TABS: 1.0000 | ORAL_TABLET | Freq: Four times a day (QID) | ORAL | Status: DC | PRN

## 2020-02-24 MED ORDER — HEPARIN BOLUS VIA INFUSION
4000.0000 [IU] | Freq: Once | INTRAVENOUS | Status: AC
  Administered 2020-02-24: 4000 [IU] via INTRAVENOUS
  Filled 2020-02-24: qty 4000

## 2020-02-24 MED ORDER — ACETAMINOPHEN 325 MG PO TABS
650.0000 mg | ORAL_TABLET | ORAL | Status: DC | PRN
  Administered 2020-02-25: 08:00:00 650 mg via ORAL
  Filled 2020-02-24: qty 2

## 2020-02-24 MED ORDER — AMLODIPINE BESYLATE 5 MG PO TABS: 5.0000 mg | ORAL_TABLET | Freq: Every day | ORAL | Status: DC

## 2020-02-24 MED ORDER — SODIUM CHLORIDE 0.9 % IV SOLN: INTRAVENOUS | Status: DC

## 2020-02-24 MED ORDER — POTASSIUM CHLORIDE CRYS ER 20 MEQ PO TBCR
30.0000 meq | EXTENDED_RELEASE_TABLET | Freq: Once | ORAL | Status: AC
  Administered 2020-02-24: 30 meq via ORAL
  Filled 2020-02-24: qty 1

## 2020-02-24 MED ORDER — FLUTICASONE PROPIONATE 50 MCG/ACT NA SUSP
1.0000 | Freq: Every day | NASAL | Status: DC | PRN
  Filled 2020-02-24: qty 16

## 2020-02-24 MED ORDER — HYDRALAZINE HCL 20 MG/ML IJ SOLN: 5.0000 mg | INTRAMUSCULAR | Status: DC | PRN

## 2020-02-24 MED ORDER — ALBUTEROL SULFATE (2.5 MG/3ML) 0.083% IN NEBU
2.5000 mg | INHALATION_SOLUTION | Freq: Once | RESPIRATORY_TRACT | Status: AC
  Administered 2020-02-24: 2.5 mg via RESPIRATORY_TRACT
  Filled 2020-02-24: qty 3

## 2020-02-24 MED ORDER — ASPIRIN 81 MG PO CHEW
CHEWABLE_TABLET | ORAL | Status: AC
  Filled 2020-02-24: qty 1

## 2020-02-24 MED ORDER — SIMVASTATIN 20 MG PO TABS
20.0000 mg | ORAL_TABLET | Freq: Every day | ORAL | Status: DC
  Administered 2020-02-24: 23:00:00 20 mg via ORAL
  Filled 2020-02-24: qty 1

## 2020-02-24 MED ORDER — GABAPENTIN 600 MG PO TABS
600.0000 mg | ORAL_TABLET | Freq: Three times a day (TID) | ORAL | Status: DC
  Administered 2020-02-24 – 2020-02-27 (×8): 600 mg via ORAL
  Filled 2020-02-24 (×8): qty 1

## 2020-02-24 MED ORDER — IPRATROPIUM BROMIDE 0.02 % IN SOLN
2.0000 mL | RESPIRATORY_TRACT | Status: DC
  Administered 2020-02-24 – 2020-02-25 (×2): 0.4 mg via RESPIRATORY_TRACT
  Filled 2020-02-24 (×3): qty 2.5

## 2020-02-24 MED ORDER — GABAPENTIN 300 MG PO CAPS
300.0000 mg | ORAL_CAPSULE | Freq: Three times a day (TID) | ORAL | Status: DC
  Administered 2020-02-24 – 2020-02-27 (×8): 300 mg via ORAL
  Filled 2020-02-24 (×8): qty 1

## 2020-02-24 MED ORDER — ALBUTEROL SULFATE (2.5 MG/3ML) 0.083% IN NEBU: 3.0000 mL | INHALATION_SOLUTION | RESPIRATORY_TRACT | Status: DC | PRN

## 2020-02-24 MED ORDER — IOHEXOL 350 MG/ML SOLN
75.0000 mL | Freq: Once | INTRAVENOUS | Status: AC | PRN
  Administered 2020-02-24: 20:00:00 75 mL via INTRAVENOUS

## 2020-02-24 MED ORDER — DM-GUAIFENESIN ER 30-600 MG PO TB12
1.0000 | ORAL_TABLET | Freq: Two times a day (BID) | ORAL | Status: DC
  Administered 2020-02-24 – 2020-02-27 (×6): 1 via ORAL
  Filled 2020-02-24 (×8): qty 1

## 2020-02-24 NOTE — ED Provider Notes (Signed)
[facility] Emergency Department Provider Note ____________________________________________   First MD Initiated Contact with Patient 02/24/20 1344     (approximate)  I have reviewed the triage vital signs and the nursing notes.   HISTORY  Chief Complaint Shortness of Breath    HPI Hannah Martinez is a 64 y.o. female with PMH as noted below including a history of a stroke (but no cardiac history and no COPD or CHF) who presents with shortness of breath over the last several days, gradual onset, worse with exertion, and associated with nonproductive cough and wheezing.  She also has had some chest pain although she thinks that this is secondary to the cough.  She describes it as a tightness.  She has no lightheadedness, vomiting, or fever.  She reports a negative COVID-19 test on 2/18.  Past Medical History:  Diagnosis Date  . Cerebral aneurysm   . Chronic pain   . Degenerative joint disease   . Hypercholesteremia   . Stroke (HCC)   . TMJ (dislocation of temporomandibular joint)     Patient Active Problem List   Diagnosis Date Noted  . Chest pain 02/24/2020  . Elevated troponin 02/24/2020  . Stroke [facility])     Past Surgical History:  Procedure Laterality Date  . CEREBRAL ANEURYSM REPAIR      Prior to Admission medications   Medication Sig Start Date End Date Taking? Authorizing Provider  fluticasone (FLONASE) 50 MCG/ACT nasal spray Place 1-2 sprays into both nostrils daily as needed for allergies. 10/29/19   [provider]  gabapentin (NEURONTIN) 300 MG capsule Take 300 mg by mouth 3 (three) times daily. (take each capsule with 600mg  tablet to equal 900mg  three times a day) 10/29/19   [provider]  gabapentin (NEURONTIN) 600 MG tablet Take 600 mg by mouth 3 (three) times daily. (take each tablet with 300mg  capsule to equal 900mg  three times a day) 10/29/19   [provider]  HYDROcodone-acetaminophen (NORCO/VICODIN)  5-325 MG tablet Take 1 tablet by mouth every 6 (six) hours as needed for moderate pain. 11/26/17   , MD  meloxicam (MOBIC) 7.5 MG tablet Take 7.5 mg by mouth daily. 08/09/19   [provider]  omeprazole (PRILOSEC) 20 MG capsule Take 20 mg by mouth daily. 10/29/19   [provider]  simvastatin (ZOCOR) 20 MG tablet Take 20 mg by mouth at bedtime. 10/29/19   [provider]    Allergies Lipitor [atorvastatin]  Family History  Problem Relation Age of Onset  . Breast cancer Neg Hx     Social History Social History   Tobacco Use  . Smoking status: Never Smoker  . Smokeless tobacco: Never Used  Substance Use Topics  . Alcohol use: No  . Drug use: No    Review of Systems  Constitutional: No fever. Eyes: No redness. ENT: No sore throat. Cardiovascular: Positive for chest pain. Respiratory: Positive for shortness of breath. Gastrointestinal: No vomiting or diarrhea.  Genitourinary: Negative for dysuria.  Musculoskeletal: Negative for back pain. Skin: Negative for rash. Neurological: Negative for headache.   ____________________________________________   PHYSICAL EXAM:  VITAL SIGNS: ED Triage Vitals  Enc Vitals Group     BP 02/24/20 1321 131/84     Pulse Rate 02/24/20 1321 (!) 116     Resp 02/24/20 1321 20     Temp 02/24/20 1321 99 F (37.2 C)     Temp Source 02/24/20 1321 Oral     SpO2 02/24/20 1321 100 %  Weight 02/24/20 1318 160 lb (72.6 kg)     Height 02/24/20 1318 5\' 5"  (1.651 m)     Head Circumference --      Peak Flow --      Pain Score 02/24/20 1317 8     Pain Loc --      Pain Edu? --      Excl. in GC? --     Constitutional: Alert and oriented.  Relatively well appearing and in no acute distress. Eyes: Conjunctivae are normal.  Head: Atraumatic. Nose: No congestion/rhinnorhea. Mouth/Throat: Mucous membranes are moist.   Neck: Normal range of motion.  Cardiovascular: Normal rate, regular rhythm. Grossly normal  heart sounds.  Good peripheral circulation. Respiratory: Normal respiratory effort with intermittent coughing.  Slightly diminished breath sounds bilaterally with faint wheezing and no rales. Gastrointestinal: Soft and nontender. No distention.  Genitourinary: No flank tenderness. Musculoskeletal: No lower extremity edema.  Extremities warm and well perfused.  Neurologic:  Normal speech and language. No gross focal neurologic deficits are appreciated.  Skin:  Skin is warm and dry. No rash noted. Psychiatric: Mood and affect are normal. Speech and behavior are normal.  ____________________________________________   LABS (all labs ordered are listed, but only abnormal results are displayed)  Labs Reviewed  BASIC METABOLIC PANEL - Abnormal; Notable for the following components:      Result Value   Potassium 3.4 (*)    CO2 20 (*)    Glucose, Bld 109 (*)    Calcium 8.7 (*)    All other components within normal limits  TROPONIN I (HIGH SENSITIVITY) - Abnormal; Notable for the following components:   Troponin I (High Sensitivity) 214 (*)    All other components within normal limits  RESPIRATORY PANEL BY RT PCR (FLU A&B, COVID)  CBC  APTT  PROTIME-INR  HEPARIN LEVEL (UNFRACTIONATED)  TROPONIN I (HIGH SENSITIVITY)   ____________________________________________  EKG  ED ECG REPORT I, 02/26/20, the attending physician, personally viewed and interpreted this ECG.  Date: 02/24/2020 EKG Time: 1330 Rate: 103 Rhythm: normal sinus rhythm QRS Axis: normal Intervals: Prolonged QTc ST/T Wave abnormalities: T wave inversions anterior lateral with less than 2 mm ST elevation in V2 Narrative Interpretation: Nonspecific anterior ST abnormalities when compared to EKG of 11-22-2019  ED ECG REPORT I, 11-24-2019, the attending physician, personally viewed and interpreted this ECG.  Date: 02/24/2020 EKG Time: 1448 Rate: 107 Rhythm: Sinus tachycardia with frequent PVCs QRS  Axis: normal Intervals: Prolonged QTc ST/T Wave abnormalities: Nonspecific ST abnormalities in the anterior leads Narrative Interpretation: Nonspecific anterior ST abnormalities with no dynamic changes when compared to EKG of 1330 today   ____________________________________________  RADIOLOGY  CXR: No focal infiltrate or other acute abnormality  ____________________________________________   PROCEDURES  Procedure(s) performed: No  Procedures  Critical Care performed: Yes  CRITICAL CARE Performed by: 02/26/2020   Total critical care time: 30 minutes  Critical care time was exclusive of separately billable procedures and treating other patients.  Critical care was necessary to treat or prevent imminent or life-threatening deterioration.  Critical care was time spent personally by me on the following activities: development of treatment plan with patient and/or surrogate as well as nursing, discussions with consultants, evaluation of patient's response to treatment, examination of patient, obtaining history from patient or surrogate, ordering and performing treatments and interventions, ordering and review of laboratory studies, ordering and review of radiographic studies, pulse oximetry and re-evaluation of patient's condition. ____________________________________________   INITIAL IMPRESSION / ASSESSMENT  AND PLAN / ED COURSE  Pertinent labs & imaging results that were available during my care of the patient were reviewed by me and considered in my medical decision making (see chart for details).  64 year old female with PMH as noted above including prior CVA but no COPD or CHF, no CAD or other cardiac history presents with primarily shortness of breath, cough, and wheezing over the last 2 weeks, worse in the last several days.  The patient reports a negative COVID-19 test 10 days ago.  She also has some atypical chest pain although feels that this is secondary to the  cough.  On exam, the patient is overall relatively well-appearing.  Her vital signs are normal except for borderline low BP and borderline tachycardia.  She is not hypoxic.  She does have some intermittent fits of coughing, but no respiratory distress or significantly increased work of breathing.  She has some faint wheezes bilaterally.  The remainder of the exam is as described above.  EKG shows nonspecific anterior ST changes which are slightly more prominent when compared to an EKG from November of last year.  Differential primarily includes acute bronchitis, pneumonia, COVID-19, or less likely new onset CHF.  Differential also includes ACS or other cardiac etiology.  We will obtain a chest x-ray, lab work-up, give nebs and steroid and reassess.  ----------------------------------------- 3:17 PM on 02/24/2020 -----------------------------------------  The patient's initial troponin is elevated.  Repeat EKG shows no dynamic changes when compared to the EKG from earlier this afternoon.  However, given that today's EKGs do appear different than prior, I am concerned for ACS.  I have ordered aspirin and started the patient on a heparin drip.  We will plan to admit to the hospitalist service.  ----------------------------------------- 3:23 PM on 02/24/2020 -----------------------------------------  I discussed the case with Dr. Blaine Hamper from the hospitalist service.  ____________________________  [name] was evaluated in Emergency Department on 02/24/2020 for the symptoms described in the history of present illness. She was evaluated in the context of the global COVID-19 pandemic, which necessitated consideration that the patient might be at risk for infection with the SARS-CoV-2 virus that causes COVID-19. Institutional protocols and algorithms that pertain to the evaluation of patients at risk for COVID-19 are in a state of rapid change based on information released by regulatory bodies  including the CDC and federal and state organizations. These policies and algorithms were followed during the patient's care in the ED. ____________________________________________   FINAL CLINICAL IMPRESSION(S) / ED DIAGNOSES  Final diagnoses:  NSTEMI (non-ST elevated myocardial infarction) ([place])      NEW MEDICATIONS STARTED DURING THIS VISIT:  New Prescriptions   No medications on file     Note:  This document was prepared using Dragon voice recognition software and may include unintentional dictation errors.   Arta Silence, MD 02/24/20 [phone]

## 2020-02-24 NOTE — Consult Note (Signed)
ANTICOAGULATION CONSULT NOTE   Pharmacy Consult for Heparin drip Indication: chest pain/ACS/STEMI  Allergies  Allergen Reactions  . Lipitor [Atorvastatin] Hives    Patient Measurements: Height: 5\' 5"  (165.1 cm) Weight: 160 lb (72.6 kg) IBW/kg (Calculated) : 57 Heparin Dosing Weight: 71.6kg  Vital Signs: Temp: 99.2 F (37.3 C) (02/28 1958) Temp Source: Oral (02/28 1958) BP: 107/71 (02/28 1958) Pulse Rate: 102 (02/28 1958)  Labs: Recent Labs    02/24/20 1345 02/24/20 1531 02/24/20 2136  HGB 12.7  --   --   HCT 38.9  --   --   PLT 354  --   --   APTT  --  >160*  --   LABPROT  --  14.7  --   INR  --  1.2  --   HEPARINUNFRC  --   --  0.48  CREATININE 0.87  --   --   TROPONINIHS 214* 203*  --     Estimated Creatinine Clearance: 66.9 mL/min (by C-G formula based on SCr of 0.87 mg/dL).   Medical History: Past Medical History:  Diagnosis Date  . Cerebral aneurysm   . Chronic pain   . Degenerative joint disease   . Hypercholesteremia   . Stroke (HCC)   . TMJ (dislocation of temporomandibular joint)     Medications:  No PTA anticoagulant of record  Assessment: 64 yo female with hx of stroke presents with SOB/cough/chest pain.  Troponins 214.  Pharmacy has been consulted to initiate and monitor heparin drip.  2/28 HL @ 2136 0.48. Therapeutic x1.   Goal of Therapy:  Heparin level 0.3-0.7 units/ml Monitor platelets by anticoagulation protocol: Yes   Plan:  Continue heparin infusion at 850 units/hr Check anti-Xa level in 6 hours and daily while on heparin Continue to monitor H&H and platelets  2137, PharmD Clinical Pharmacist 02/24/2020 10:10 PM

## 2020-02-24 NOTE — ED Notes (Signed)
Repeat ekg performed per orders.

## 2020-02-24 NOTE — ED Triage Notes (Signed)
Pt comes in with wheezing and cough and SOB. Pt states it started Friday. Negative covid on 2/18. Pt also states CP from coughing and headache. Ambulatory to triage.

## 2020-02-24 NOTE — Consult Note (Addendum)
Cardiology Consultation:   Patient ID: Hannah Martinez MRN: [id number]; DOB: [birth year]  Admit date: 02/24/2020 Date of Consult: 02/24/2020  Primary Care Provider: [facility] Primary Cardiologist: New to [facility] Physician requesting consult: Dr. Lorretta Harp Reason for consult: Chest pain, shortness of breath   Patient Profile:   Hannah Martinez is a 64 y.o. female with a hx of stroke, cerebral aneurysm s/p clipping, chronic pain, hyperlipidemia presenting with nonproductive cough, wheezing (chest discomfort which she feels is from the coughing)  History of Present Illness:  Hannah Martinez presents to the hospital of wheezing, cough, shortness of breath starting 3 days ago, Friday Covid neg February 18th Reports having some chest discomfort from the heavy coughing, also some headache Initial troponin measured in the emergency room 214  Chest feels tight Heart rate 116 bpm on arrival  EKG nonacute, showing sinus tachycardia Secondary to her elevated troponin she was started on aspirin and heparin infusion and admitted for further observation  Also reported some nausea, vomiting, diarrhea, 3-4 diarrhea this Am, malaise and fatigue over the past weekend Started on IV fluids, C. difficile PCR ordered    Heart Pathway Score:     Past Medical History:  Diagnosis Date  . Cerebral aneurysm   . Chronic pain   . Degenerative joint disease   . Hypercholesteremia   . Stroke (HCC)   . TMJ (dislocation of temporomandibular joint)     Past Surgical History:  Procedure Laterality Date  . CEREBRAL ANEURYSM REPAIR       Home Medications:  Prior to Admission medications   Medication Sig Start Date End Date Taking? Authorizing Provider  fluticasone (FLONASE) 50 MCG/ACT nasal spray Place 1-2 sprays into both nostrils daily as needed for allergies. 10/29/19  Yes [provider]  gabapentin (NEURONTIN) 300 MG capsule Take 300 mg by mouth 3 (three) times  daily. (take each capsule with 600mg  tablet to equal 900mg  three times a day) 10/29/19  Yes [provider]  gabapentin (NEURONTIN) 600 MG tablet Take 600 mg by mouth 3 (three) times daily. (take each tablet with 300mg  capsule to equal 900mg  three times a day) 10/29/19  Yes [provider]  omeprazole (PRILOSEC) 20 MG capsule Take 20 mg by mouth daily. 10/29/19  Yes [provider]  simvastatin (ZOCOR) 20 MG tablet Take 20 mg by mouth at bedtime. 10/29/19  Yes [provider]  HYDROcodone-acetaminophen (NORCO/VICODIN) 5-325 MG tablet Take 1 tablet by mouth every 6 (six) hours as needed for moderate pain. 11/26/17   13/2/20, MD  meloxicam (MOBIC) 7.5 MG tablet Take 7.5 mg by mouth daily. 08/09/19   [provider]    Inpatient Medications: Scheduled Meds: . aspirin      . [START ON 02/25/2020] aspirin  324 mg Oral Daily  . dextromethorphan-guaiFENesin  1 tablet Oral BID  . gabapentin  300 mg Oral TID  . gabapentin  600 mg Oral TID  . ipratropium  2 mL Inhalation Q4H  . [START ON 02/25/2020] pantoprazole  40 mg Oral Daily  . potassium chloride  30 mEq Oral Once  . simvastatin  20 mg Oral QHS   Continuous Infusions: . sodium chloride    . heparin 850 Units/hr (02/24/20 1808)   PRN Meds: acetaminophen, albuterol, [START ON 02/25/2020] fluticasone, HYDROcodone-acetaminophen  Allergies:    Allergies  Allergen Reactions  . Lipitor [Atorvastatin] Hives    Social History:   Social History   Socioeconomic History  . Marital status: Single  Spouse name: Not on file  . Number of children: Not on file  . Years of education: Not on file  . Highest education level: Not on file  Occupational History  . Not on file  Tobacco Use  . Smoking status: Never Smoker  . Smokeless tobacco: Never Used  Substance and Sexual Activity  . Alcohol use: No  . Drug use: No  . Sexual activity: Not on file  Other Topics Concern  . Not on file  Social History  Narrative  . Not on file   Social Determinants of Health   Financial Resource Strain:   . Difficulty of Paying Living Expenses: Not on file  Food Insecurity:   . Worried About Charity fundraiser in the Last Year: Not on file  . Ran Out of Food in the Last Year: Not on file  Transportation Needs:   . Lack of Transportation (Medical): Not on file  . Lack of Transportation (Non-Medical): Not on file  Physical Activity:   . Days of Exercise per Week: Not on file  . Minutes of Exercise per Session: Not on file  Stress:   . Feeling of Stress : Not on file  Social Connections:   . Frequency of Communication with Friends and Family: Not on file  . Frequency of Social Gatherings with Friends and Family: Not on file  . Attends Religious Services: Not on file  . Active Member of Clubs or Organizations: Not on file  . Attends Archivist Meetings: Not on file  . Marital Status: Not on file  Intimate Partner Violence:   . Fear of Current or Ex-Partner: Not on file  . Emotionally Abused: Not on file  . Physically Abused: Not on file  . Sexually Abused: Not on file    Family History:    Family History  Problem Relation Age of Onset  . Breast cancer Neg Hx      ROS:  Please see the history of present illness.  Review of Systems  Constitutional: Negative.   HENT: Negative.   Respiratory: Positive for cough, shortness of breath and wheezing.   Cardiovascular: Negative.   Gastrointestinal: Positive for diarrhea, nausea and vomiting.  Musculoskeletal: Negative.   Neurological: Negative.   Psychiatric/Behavioral: Negative.   All other systems reviewed and are negative.   Physical Exam/Data:   Vitals:   02/24/20 1630 02/24/20 1700 02/24/20 1730 02/24/20 1802  BP: 112/72 114/74 107/76 111/76  Pulse: (!) 103 86 (!) 102 100  Resp: (!) 22 19 (!) 25 18  Temp:    98.3 F (36.8 C)  TempSrc:      SpO2: 97% 98% 97% 97%  Weight:      Height:       No intake or output data  in the 24 hours ending 02/24/20 1928 Last 3 Weights 02/24/2020 11/22/2019 11/26/2017  Weight (lbs) 160 lb 155 lb 154 lb  Weight (kg) 72.576 kg 70.308 kg 69.854 kg     Body mass index is 26.63 kg/m.  General:  Well nourished, well developed, in no acute distress HEENT: normal Lymph: no adenopathy Neck: no JVD Endocrine:  No thryomegaly Vascular: No carotid bruits; FA pulses 2+ bilaterally without bruits  Cardiac:  normal S1, S2; RRR; no murmur  Lungs:  clear to auscultation bilaterally, no wheezing, rhonchi or rales  Abd: soft, nontender, no hepatomegaly  Ext: no edema Musculoskeletal:  No deformities, BUE and BLE strength normal and equal Skin: warm and dry  Neuro:  CNs 2-12 intact, no focal abnormalities noted Psych:  Normal affect   EKG:  The EKG was personally reviewed and demonstrates:   Shows normal sinus rhythm rate 103 bpm poor R wave progression to the anterior precordial leads, ST-T wave abnormality anterolateral leads I and aVL  Telemetry:  Telemetry was personally reviewed and demonstrates: Normal sinus rhythm  Relevant CV Studies: Echocardiogram pending  Laboratory Data:  High Sensitivity Troponin:   Recent Labs  Lab 02/24/20 1345 02/24/20 1531  TROPONINIHS 214* 203*     Chemistry Recent Labs  Lab 02/24/20 1345  NA 140  K 3.4*  CL 111  CO2 20*  GLUCOSE 109*  BUN 8  CREATININE 0.87  CALCIUM 8.7*  GFRNONAA >60  GFRAA >60  ANIONGAP 9    No results for input(s): PROT, ALBUMIN, AST, ALT, ALKPHOS, BILITOT in the last 168 hours. Hematology Recent Labs  Lab 02/24/20 1345  WBC 7.4  RBC 3.90  HGB 12.7  HCT 38.9  MCV 99.7  MCH 32.6  MCHC 32.6  RDW 12.5  PLT 354   BNPNo results for input(s): BNP, PROBNP in the last 168 hours.  DDimer No results for input(s): DDIMER in the last 168 hours.   Radiology/Studies:  DG Chest 2 View  Result Date: 02/24/2020 CLINICAL DATA:  Wheezing, headache, cough, shortness of breath EXAM: CHEST - 2 VIEW  COMPARISON:  11/22/2019 FINDINGS: There is mild bilateral interstitial thickening. The lungs are mildly hyperinflated. There is no focal consolidation. There is no pleural effusion or pneumothorax. The cardiomediastinal silhouette is stable. There is no acute osseous abnormality. IMPRESSION: No acute cardiopulmonary disease. Electronically Signed   By: Elige Ko   On: 02/24/2020 13:57   {  Assessment and Plan:   1.  Cough, wheezing Unable to exclude viral URI, sx started 3 days ago, continued through the weekend with malaise, fatigue, "flu type sx", diarrhea, nausea this AM X-ray no acute findings Covid negative Supportive care --echo pending to rule out cardiac etiology/CHF  2.  Elevated troponin/demand ischemia In setting of above illness 214, downtrending to 203 possible demand ischemia in the setting of virral syndrome Chest pain atypical in the setting of cough, likely musculoskeletal Echo pending, if normal EF, no further ischemic workup needed For depressed EF, would warrant ischemic workup  3.  Nausea/vomiting/diarrhea Concern for viral syndrome Episodes this AM  4). Diffuse coronary calcifications. Hx of hyperlipidemia, no smoking Asa, simvastatin as she is taking at home Echo pending  5. Hx of CVA Asa, statin   Total encounter time more than 110 minutes  Greater than 50% was spent in counseling and coordination of care with the patient    For questions or updates, please contact [facility] Please consult [email or web address] for contact info under     Signed, Julien Nordmann, MD  02/24/2020 7:28 PM

## 2020-02-24 NOTE — H&P (Signed)
History and Physical    Hannah Martinez WGN:[id number] DOB: October 13, 1956 DOA: 02/24/2020  Referring MD/NP/PA:   PCP: [facility]   Patient coming from:  The patient is coming from home.  At baseline, pt is independent for most of ADL.        Chief Complaint: Shortness breath, chest pain  HPI: Hannah Martinez is a 64 y.o. female with medical history significant of hyperlipidemia, stroke, GERD, cerebral aneurysm (s/p of clipping), who presents with shortness breath and chest pain.  Patient states that she has been having cough, shortness breath, chest pain for several days, which is worsened today.  The chest pain is located in the left side of the chest, sharp, nonradiating.  It is aggravated by coughing.  No fever or chills.  Patient states that she has nausea, vomited several times with nonbilious nonbloody vomitus.  She also has had at least 4 times of loose stool bowel movement.  No diarrhea.  No symptoms of UTI or unilateral weakness.  No recent fall or rectal bleeding.  ED Course: pt was found to have WBC 7.4, troponin 214, pending COVID-19 test, potassium 3.4, renal function normal, temperature 99, soft blood pressure, tachycardia, oxygen saturation 97% on room air.  Chest x-ray negative.  Review of Systems:   General: no fevers, chills, no body weight gain, has fatigue HEENT: no blurry vision, hearing changes or sore throat Respiratory: has dyspnea, coughing, wheezing CV: no chest pain, no palpitations GI: has nausea, vomiting, abdominal pain, diarrhea, no constipation GU: no dysuria, burning on urination, increased urinary frequency, hematuria  Ext: no leg edema Neuro: no unilateral weakness, numbness, or tingling, no vision change or hearing loss Skin: no rash, no skin tear. MSK: No muscle spasm, no deformity, no limitation of range of movement in spin Heme: No easy bruising.  Travel history: No recent long distant travel.  Allergy:  Allergies    Allergen Reactions  . Lipitor [Atorvastatin] Hives    Past Medical History:  Diagnosis Date  . Cerebral aneurysm   . Chronic pain   . Degenerative joint disease   . Hypercholesteremia   . Stroke (HCC)   . TMJ (dislocation of temporomandibular joint)     Past Surgical History:  Procedure Laterality Date  . CEREBRAL ANEURYSM REPAIR      Social History:  reports that she has never smoked. She has never used smokeless tobacco. She reports that she does not drink alcohol or use drugs.  Family History:  Family History  Problem Relation Age of Onset  . Breast cancer Neg Hx      Prior to Admission medications   Medication Sig Start Date End Date Taking? Authorizing Provider  fluticasone (FLONASE) 50 MCG/ACT nasal spray Place 1-2 sprays into both nostrils daily as needed for allergies. 10/29/19   [provider]  gabapentin (NEURONTIN) 300 MG capsule Take 300 mg by mouth 3 (three) times daily. (take each capsule with 600mg  tablet to equal 900mg  three times a day) 10/29/19   [provider]  gabapentin (NEURONTIN) 600 MG tablet Take 600 mg by mouth 3 (three) times daily. (take each tablet with 300mg  capsule to equal 900mg  three times a day) 10/29/19   [provider]  HYDROcodone-acetaminophen (NORCO/VICODIN) 5-325 MG tablet Take 1 tablet by mouth every 6 (six) hours as needed for moderate pain. 11/26/17   , MD  meloxicam (MOBIC) 7.5 MG tablet Take 7.5 mg by mouth daily. 08/09/19   [provider]  omeprazole (  PRILOSEC) 20 MG capsule Take 20 mg by mouth daily. 10/29/19   [provider]  simvastatin (ZOCOR) 20 MG tablet Take 20 mg by mouth at bedtime. 10/29/19   [provider]    Physical Exam: Vitals:   02/24/20 1630 02/24/20 1700 02/24/20 1730 02/24/20 1802  BP: 112/72 114/74 107/76 111/76  Pulse: (!) 103 86 (!) 102 100  Resp: (!) 22 19 (!) 25 18  Temp:    98.3 F (36.8 C)  TempSrc:      SpO2: 97% 98% 97% 97%   Weight:      Height:       General: Not in acute distress HEENT:       Eyes: PERRL, EOMI, no scleral icterus.       ENT: No discharge from the ears and nose, no pharynx injection, no tonsillar enlargement.        Neck: No JVD, no bruit, no mass felt. Heme: No neck lymph node enlargement. Cardiac: S1/S2, RRR, No murmurs, No gallops or rubs. Respiratory: has mild wheezing bilaterally. GI: Soft, nondistended, nontender, no rebound pain, no organomegaly, BS present. GU: No hematuria Ext: No pitting leg edema bilaterally. 2+DP/PT pulse bilaterally. Musculoskeletal: No joint deformities, No joint redness or warmth, no limitation of ROM in spin. Skin: No rashes.  Neuro: Alert, oriented X3, cranial nerves II-XII grossly intact, moves all extremities normally.  Psych: Patient is not psychotic, no suicidal or hemocidal ideation.  Labs on Admission: I have personally reviewed following labs and imaging studies  CBC: Recent Labs  Lab 02/24/20 1345  WBC 7.4  HGB 12.7  HCT 38.9  MCV 99.7  PLT 354   Basic Metabolic Panel: Recent Labs  Lab 02/24/20 1345  NA 140  K 3.4*  CL 111  CO2 20*  GLUCOSE 109*  BUN 8  CREATININE 0.87  CALCIUM 8.7*   GFR: Estimated Creatinine Clearance: 66.9 mL/min (by C-G formula based on SCr of 0.87 mg/dL). Liver Function Tests: No results for input(s): AST, ALT, ALKPHOS, BILITOT, PROT, ALBUMIN in the last 168 hours. No results for input(s): LIPASE, AMYLASE in the last 168 hours. No results for input(s): AMMONIA in the last 168 hours. Coagulation Profile: Recent Labs  Lab 02/24/20 1531  INR 1.2   Cardiac Enzymes: No results for input(s): CKTOTAL, CKMB, CKMBINDEX, TROPONINI in the last 168 hours. BNP (last 3 results) No results for input(s): PROBNP in the last 8760 hours. HbA1C: No results for input(s): HGBA1C in the last 72 hours. CBG: No results for input(s): GLUCAP in the last 168 hours. Lipid Profile: No results for input(s): CHOL, HDL,  LDLCALC, TRIG, CHOLHDL, LDLDIRECT in the last 72 hours. Thyroid Function Tests: No results for input(s): TSH, T4TOTAL, FREET4, T3FREE, THYROIDAB in the last 72 hours. Anemia Panel: No results for input(s): VITAMINB12, FOLATE, FERRITIN, TIBC, IRON, RETICCTPCT in the last 72 hours. Urine analysis:    Component Value Date/Time   COLORURINE YELLOW (A) 11/22/2019 1131   APPEARANCEUR CLEAR (A) 11/22/2019 1131   LABSPEC 1.017 11/22/2019 1131   PHURINE 6.0 11/22/2019 1131   GLUCOSEU NEGATIVE 11/22/2019 1131   HGBUR NEGATIVE 11/22/2019 1131   BILIRUBINUR NEGATIVE 11/22/2019 1131   KETONESUR NEGATIVE 11/22/2019 1131   PROTEINUR NEGATIVE 11/22/2019 1131   NITRITE NEGATIVE 11/22/2019 1131   LEUKOCYTESUR NEGATIVE 11/22/2019 1131   Sepsis Labs: @LABRCNTIP (procalcitonin:4,lacticidven:4) ) Recent Results (from the past 240 hour(s))  Respiratory Panel by RT PCR (Flu A&B, Covid) - Nasopharyngeal Swab     Status: None  Collection Time: 02/24/20  3:31 PM   Specimen: Nasopharyngeal Swab  Result Value Ref Range Status   SARS Coronavirus 2 by RT PCR NEGATIVE NEGATIVE Final    Comment: (NOTE) SARS-CoV-2 target nucleic acids are NOT DETECTED. The SARS-CoV-2 RNA is generally detectable in upper respiratoy specimens during the acute phase of infection. The lowest concentration of SARS-CoV-2 viral copies this assay can detect is 131 copies/mL. A negative result does not preclude SARS-Cov-2 infection and should not be used as the sole basis for treatment or other patient management decisions. A negative result may occur with  improper specimen collection/handling, submission of specimen other than nasopharyngeal swab, presence of viral mutation(s) within the areas targeted by this assay, and inadequate number of viral copies (<131 copies/mL). A negative result must be combined with clinical observations, patient history, and epidemiological information. The expected result is Negative. Fact Sheet for  Patients:  https://www.moore.com/ Fact Sheet for Healthcare Providers:  https://www.young.biz/ This test is not yet ap proved or cleared by the [place] FDA and  has been authorized for detection and/or diagnosis of SARS-CoV-2 by FDA under an Emergency Use Authorization (EUA). This EUA will remain  in effect (meaning this test can be used) for the duration of the COVID-19 declaration under Section 564(b)(1) of the Act, 21 U.S.C. section 360bbb-3(b)(1), unless the authorization is terminated or revoked sooner.    Influenza A by PCR NEGATIVE NEGATIVE Final   Influenza B by PCR NEGATIVE NEGATIVE Final    Comment: (NOTE) The Xpert Xpress SARS-CoV-2/FLU/RSV assay is intended as an aid in  the diagnosis of influenza from Nasopharyngeal swab specimens and  should not be used as a sole basis for treatment. Nasal washings and  aspirates are unacceptable for Xpert Xpress SARS-CoV-2/FLU/RSV  testing. Fact Sheet for Patients: https://www.moore.com/ Fact Sheet for Healthcare Providers: https://www.young.biz/ This test is not yet approved or cleared by the [place] FDA and  has been authorized for detection and/or diagnosis of SARS-CoV-2 by  FDA under an Emergency Use Authorization (EUA). This EUA will remain  in effect (meaning this test can be used) for the duration of the  Covid-19 declaration under Section 564(b)(1) of the Act, 21  U.S.C. section 360bbb-3(b)(1), unless the authorization is  terminated or revoked. Performed at [facility], [address] [place], [place] [address]      Radiological Exams on Admission: DG Chest 2 View  Result Date: 02/24/2020 CLINICAL DATA:  Wheezing, headache, cough, shortness of breath EXAM: CHEST - 2 VIEW COMPARISON:  11/22/2019 FINDINGS: There is mild bilateral interstitial thickening. The lungs are mildly hyperinflated. There is no focal consolidation.  There is no pleural effusion or pneumothorax. The cardiomediastinal silhouette is stable. There is no acute osseous abnormality. IMPRESSION: No acute cardiopulmonary disease. Electronically Signed   By: Elige Ko   On: 02/24/2020 13:57     EKG: Independently reviewed.  Sinus rhythm, QTC 524, questionable ST elevation in V2-V3, T wave inversion in precordial leads in the lateral leads, poor R wave progression, LAE.  Assessment/Plan Principal Problem:   Chest pain Active Problems:   Elevated troponin   Stroke (HCC)   GERD (gastroesophageal reflux disease)   Hypokalemia   Nausea vomiting and diarrhea   Chest pain, SOB and elevated trop: trop 214 -->203.  Etiology is not clear.  Patient has mild wheezing on auscultation, indicating bronchospasm.  Will treat patient with bronchodilators.  Patient seems to have pleuritic chest pain.  Will need to rule out PE.  -Admit  to telemetry bed as inpatient -Start IV heparin empirically -Follow-up CT angiogram to rule out PE -Trend Trop - Repeat EKG in the am  - prn norcor for pain - aspirin,zocor - Risk factor stratification: will check FLP and A1C  - check UDS - 2d echo -Message sent to Dr. Rockey Situ of cardiology for consultation  Hx of Stroke [facility]) -on ASA and zocor  GERD (gastroesophageal reflux disease): -protonix  Hypokalemia: K 3.4 -Repleted.  Nausea vomiting and diarrhea; -75 cc/h of NS -check C diff pcr   Inpatient status:  # Patient requires inpatient status due to high intensity of service, high risk for further deterioration and high frequency of surveillance required.  I certify that at the point of admission it is my clinical judgment that the patient will require inpatient hospital care spanning beyond 2 midnights from the point of admission.  . This patient has multiple chronic comorbidities including hyperlipidemia, stroke, GERD, cerebral aneurysm (s/p of clipping) . Now patient has presenting with chest pain, SOB.   Elevated troponin.  Also has nausea, vomiting, diarrhea . The worrisome physical exam findings include wheezing on auscultation . The initial radiographic and laboratory data are worrisome because of elevated troponin, hypokalemia . Current medical needs: please see my assessment and plan Predictability of an adverse outcome (risk): Patient has multiple comorbidities as listed above. Now presents with chest pain, SOB, elevated troponin.  Also has nausea, vomiting, diarrhea. Patient's presentation is highly complicated.  Patient is at high risk of deteriorating.  Will need to be treated in hospital for at least 2 days.         DVT ppx: on IV Heparin    Code Status: Full code Family Communication: not done, no family member is at bed side.     Disposition Plan:  Anticipate discharge back to previous home environment Consults called: message sent to Dr. Rockey Situ of card Admission status: Tele bed as inpt        Date of Service 02/24/2020    [facility]   If 7PM-7AM, please contact night-coverage [email or web address] 02/24/2020, 7:09 PM

## 2020-02-24 NOTE — ED Notes (Signed)
Pt updated on plan of care and bed assignment.  Verbalized understanding.  No acute changes noted at present. Denies chest pain or SOB.  Provided warm blanket per request.  Attempted to call report, however, RN not available.  Will call back in 5 minutes.

## 2020-02-24 NOTE — ED Notes (Signed)
Critical lab value troponin 214. Dr. Marisa Severin made aware.  Pt updated on plan of care/verbalized understanding. Will continue to monitor/reassses

## 2020-02-24 NOTE — ED Notes (Signed)
Admitting physician at bedside for assessment at this time.  Pt updated on plan of care/verbalized understanding.  No acute changes noted at this time.  Will continue to monitor/reassess.

## 2020-02-24 NOTE — Plan of Care (Signed)
Pt admitted from the ED. Pt ambulated to the bed with cane. No skin issues. Pt hooked up to monitor and confirmed with CCMD.

## 2020-02-24 NOTE — Consult Note (Addendum)
ANTICOAGULATION CONSULT NOTE - Initial Consult  Pharmacy Consult for Heparin drip Indication: chest pain/ACS/STEMI  Allergies  Allergen Reactions  . Lipitor [Atorvastatin] Hives    Patient Measurements: Height: 5\' 5"  (165.1 cm) Weight: 160 lb (72.6 kg) IBW/kg (Calculated) : 57 Heparin Dosing Weight: 71.6kg  Vital Signs: Temp: 99 F (37.2 C) (02/28 1321) Temp Source: Oral (02/28 1321) BP: 98/85 (02/28 1406) Pulse Rate: 99 (02/28 1406)  Labs: Recent Labs    02/24/20 1345  HGB 12.7  HCT 38.9  PLT 354  CREATININE 0.87  TROPONINIHS 214*    Estimated Creatinine Clearance: 66.9 mL/min (by C-G formula based on SCr of 0.87 mg/dL).   Medical History: Past Medical History:  Diagnosis Date  . Cerebral aneurysm   . Chronic pain   . Degenerative joint disease   . Hypercholesteremia   . Stroke (HCC)   . TMJ (dislocation of temporomandibular joint)     Medications:  No PTA anticoagulant of record  Assessment: 64 yo female with hx of stroke presents with SOB/cough/chest pain.  Troponins 214.  Pharmacy has been consulted to initiate and monitor heparin drip.  Goal of Therapy:  Heparin level 0.3-0.7 units/ml Monitor platelets by anticoagulation protocol: Yes   Plan:  Ordered baseline APTT and INR.  Give 4000 units bolus x 1 Start heparin infusion at 850 units/hr Check anti-Xa level in 6 hours and daily while on heparin Continue to monitor H&H and platelets  69, PharmD, BCPS Clinical Pharmacist 02/24/2020 3:07 PM

## 2020-02-25 ENCOUNTER — Inpatient Hospital Stay (HOSPITAL_BASED_OUTPATIENT_CLINIC_OR_DEPARTMENT_OTHER)
Admit: 2020-02-25 | Discharge: 2020-02-25 | Disposition: A | Discharge status: Home / Self Care | Payer: Medicare Other | Attending: Internal Medicine | Admitting: Internal Medicine

## 2020-02-25 ENCOUNTER — Encounter
Admission: EM | Disposition: A | Discharge status: Home / Self Care | Payer: Self-pay | Source: Home / Self Care | Attending: Internal Medicine

## 2020-02-25 DIAGNOSIS — I214 Non-ST elevation (NSTEMI) myocardial infarction: Secondary | ICD-10-CM | POA: Diagnosis not present

## 2020-02-25 DIAGNOSIS — Z20822 Contact with and (suspected) exposure to covid-19: Secondary | ICD-10-CM | POA: Diagnosis present

## 2020-02-25 DIAGNOSIS — Z79899 Other long term (current) drug therapy: Secondary | ICD-10-CM | POA: Diagnosis not present

## 2020-02-25 DIAGNOSIS — I272 Pulmonary hypertension, unspecified: Secondary | ICD-10-CM | POA: Diagnosis present

## 2020-02-25 DIAGNOSIS — I509 Heart failure, unspecified: Secondary | ICD-10-CM | POA: Diagnosis not present

## 2020-02-25 DIAGNOSIS — Z8673 Personal history of transient ischemic attack (TIA), and cerebral infarction without residual deficits: Secondary | ICD-10-CM | POA: Diagnosis not present

## 2020-02-25 DIAGNOSIS — K219 Gastro-esophageal reflux disease without esophagitis: Secondary | ICD-10-CM | POA: Diagnosis present

## 2020-02-25 DIAGNOSIS — I2510 Atherosclerotic heart disease of native coronary artery without angina pectoris: Secondary | ICD-10-CM

## 2020-02-25 DIAGNOSIS — R079 Chest pain, unspecified: Secondary | ICD-10-CM | POA: Diagnosis not present

## 2020-02-25 DIAGNOSIS — I248 Other forms of acute ischemic heart disease: Secondary | ICD-10-CM | POA: Diagnosis not present

## 2020-02-25 DIAGNOSIS — R112 Nausea with vomiting, unspecified: Secondary | ICD-10-CM | POA: Diagnosis present

## 2020-02-25 DIAGNOSIS — I5043 Acute on chronic combined systolic (congestive) and diastolic (congestive) heart failure: Secondary | ICD-10-CM | POA: Diagnosis present

## 2020-02-25 DIAGNOSIS — E876 Hypokalemia: Secondary | ICD-10-CM | POA: Diagnosis present

## 2020-02-25 DIAGNOSIS — I25118 Atherosclerotic heart disease of native coronary artery with other forms of angina pectoris: Secondary | ICD-10-CM | POA: Diagnosis not present

## 2020-02-25 DIAGNOSIS — I5021 Acute systolic (congestive) heart failure: Secondary | ICD-10-CM | POA: Diagnosis not present

## 2020-02-25 DIAGNOSIS — I255 Ischemic cardiomyopathy: Secondary | ICD-10-CM | POA: Diagnosis present

## 2020-02-25 DIAGNOSIS — E785 Hyperlipidemia, unspecified: Secondary | ICD-10-CM | POA: Diagnosis present

## 2020-02-25 DIAGNOSIS — R778 Other specified abnormalities of plasma proteins: Secondary | ICD-10-CM | POA: Diagnosis not present

## 2020-02-25 DIAGNOSIS — R0789 Other chest pain: Secondary | ICD-10-CM | POA: Diagnosis present

## 2020-02-25 DIAGNOSIS — I340 Nonrheumatic mitral (valve) insufficiency: Secondary | ICD-10-CM

## 2020-02-25 DIAGNOSIS — I2109 ST elevation (STEMI) myocardial infarction involving other coronary artery of anterior wall: Secondary | ICD-10-CM | POA: Diagnosis present

## 2020-02-25 DIAGNOSIS — E7800 Pure hypercholesterolemia, unspecified: Secondary | ICD-10-CM | POA: Diagnosis present

## 2020-02-25 DIAGNOSIS — Z791 Long term (current) use of non-steroidal anti-inflammatories (NSAID): Secondary | ICD-10-CM | POA: Diagnosis not present

## 2020-02-25 DIAGNOSIS — G8929 Other chronic pain: Secondary | ICD-10-CM | POA: Diagnosis present

## 2020-02-25 HISTORY — PX: RIGHT/LEFT HEART CATH AND CORONARY ANGIOGRAPHY: CATH118266

## 2020-02-25 LAB — HEPARIN LEVEL (UNFRACTIONATED): Heparin Unfractionated: 0.39 IU/mL (ref 0.30–0.70)

## 2020-02-25 LAB — BASIC METABOLIC PANEL WITH GFR
Anion gap: 9 (ref 5–15)
BUN: 9 mg/dL (ref 8–23)
CO2: 19 mmol/L — ABNORMAL LOW (ref 22–32)
Calcium: 8.4 mg/dL — ABNORMAL LOW (ref 8.9–10.3)
Chloride: 111 mmol/L (ref 98–111)
Creatinine, Ser: 0.81 mg/dL (ref 0.44–1.00)
GFR calc Af Amer: >60 mL/min
GFR calc non Af Amer: >60 mL/min
Glucose, Bld: 112 mg/dL — ABNORMAL HIGH (ref 70–99)
Potassium: 4.1 mmol/L (ref 3.5–5.1)
Sodium: 139 mmol/L (ref 135–145)

## 2020-02-25 LAB — LIPID PANEL
Cholesterol: 181 mg/dL (ref 0–200)
HDL: 45 mg/dL (ref >40)
LDL Cholesterol: 123 mg/dL — ABNORMAL HIGH (ref 0–99)
Total CHOL/HDL Ratio: 4 RATIO
Triglycerides: 64 mg/dL (ref <150)
VLDL: 13 mg/dL (ref 0–40)

## 2020-02-25 LAB — CBC
HCT: 34.3 % — ABNORMAL LOW (ref 36.0–46.0)
Hemoglobin: 11.2 g/dL — ABNORMAL LOW (ref 12.0–15.0)
MCH: 32.5 pg (ref 26.0–34.0)
MCHC: 32.7 g/dL (ref 30.0–36.0)
MCV: 99.4 fL (ref 80.0–100.0)
Platelets: 348 10*3/uL (ref 150–400)
RBC: 3.45 MIL/uL — ABNORMAL LOW (ref 3.87–5.11)
RDW: 12.7 % (ref 11.5–15.5)
WBC: 7.3 10*3/uL (ref 4.0–10.5)
nRBC: 0 % (ref 0.0–0.2)

## 2020-02-25 LAB — C DIFFICILE QUICK SCREEN W PCR REFLEX
C Diff antigen: NEGATIVE
C Diff interpretation: NOT DETECTED
C Diff toxin: NEGATIVE

## 2020-02-25 LAB — TROPONIN I (HIGH SENSITIVITY)
Troponin I (High Sensitivity): 156 ng/L (ref <18)
Troponin I (High Sensitivity): 161 ng/L (ref <18)
Troponin I (High Sensitivity): 154 ng/L (ref <18)

## 2020-02-25 LAB — ECHOCARDIOGRAM COMPLETE
Height: 65 in
Weight: 2713.6 oz

## 2020-02-25 LAB — HEMOGLOBIN A1C
Hgb A1c MFr Bld: 4.9 % (ref 4.8–5.6)
Mean Plasma Glucose: 93.93 mg/dL

## 2020-02-25 LAB — HIV ANTIBODY (ROUTINE TESTING W REFLEX): HIV Screen 4th Generation wRfx: NONREACTIVE

## 2020-02-25 SURGERY — RIGHT/LEFT HEART CATH AND CORONARY ANGIOGRAPHY
Anesthesia: Moderate Sedation

## 2020-02-25 MED ORDER — BENZONATATE 100 MG PO CAPS: 100.0000 mg | ORAL_CAPSULE | Freq: Four times a day (QID) | ORAL | 0 refills | Status: DC | PRN

## 2020-02-25 MED ORDER — PERFLUTREN LIPID MICROSPHERE
1.0000 mL | INTRAVENOUS | Status: AC | PRN
  Administered 2020-02-25: 2 mL via INTRAVENOUS
  Filled 2020-02-25: qty 10

## 2020-02-25 MED ORDER — MIDAZOLAM HCL 2 MG/2ML IJ SOLN
INTRAMUSCULAR | Status: DC | PRN
  Administered 2020-02-25: 1 mg via INTRAVENOUS

## 2020-02-25 MED ORDER — SODIUM CHLORIDE 0.9% FLUSH: 3.0000 mL | INTRAVENOUS | Status: DC | PRN

## 2020-02-25 MED ORDER — HEPARIN SODIUM (PORCINE) 1000 UNIT/ML IJ SOLN
INTRAMUSCULAR | Status: DC | PRN
  Administered 2020-02-25: 4000 [IU] via INTRAVENOUS

## 2020-02-25 MED ORDER — VERAPAMIL HCL 2.5 MG/ML IV SOLN
INTRAVENOUS | Status: DC | PRN
  Administered 2020-02-25: 2.5 mg via INTRA_ARTERIAL

## 2020-02-25 MED ORDER — HEPARIN (PORCINE) IN NACL 1000-0.9 UT/500ML-% IV SOLN
INTRAVENOUS | Status: AC
  Filled 2020-02-25: qty 1000

## 2020-02-25 MED ORDER — FENTANYL CITRATE (PF) 100 MCG/2ML IJ SOLN
INTRAMUSCULAR | Status: AC
  Filled 2020-02-25: qty 2

## 2020-02-25 MED ORDER — DM-GUAIFENESIN ER 30-600 MG PO TB12: 1.0000 | ORAL_TABLET | Freq: Two times a day (BID) | ORAL | 0 refills | Status: AC

## 2020-02-25 MED ORDER — FENTANYL CITRATE (PF) 100 MCG/2ML IJ SOLN
INTRAMUSCULAR | Status: DC | PRN
  Administered 2020-02-25: 25 ug via INTRAVENOUS

## 2020-02-25 MED ORDER — SODIUM CHLORIDE 0.9% FLUSH
3.0000 mL | Freq: Two times a day (BID) | INTRAVENOUS | Status: DC
  Administered 2020-02-25 – 2020-02-27 (×4): 3 mL via INTRAVENOUS

## 2020-02-25 MED ORDER — FUROSEMIDE 10 MG/ML IJ SOLN
40.0000 mg | Freq: Two times a day (BID) | INTRAMUSCULAR | Status: DC
  Administered 2020-02-26 – 2020-02-27 (×3): 40 mg via INTRAVENOUS
  Filled 2020-02-25 (×3): qty 4

## 2020-02-25 MED ORDER — FUROSEMIDE 10 MG/ML IJ SOLN
INTRAMUSCULAR | Status: AC
  Administered 2020-02-25: 17:00:00 40 mg via INTRAVENOUS
  Filled 2020-02-25: qty 4

## 2020-02-25 MED ORDER — ROSUVASTATIN CALCIUM 20 MG PO TABS
40.0000 mg | ORAL_TABLET | Freq: Every day | ORAL | Status: DC
  Administered 2020-02-26: 17:00:00 40 mg via ORAL
  Filled 2020-02-25 (×3): qty 2

## 2020-02-25 MED ORDER — LOSARTAN POTASSIUM 25 MG PO TABS
25.0000 mg | ORAL_TABLET | Freq: Every day | ORAL | Status: DC
  Filled 2020-02-25: qty 1

## 2020-02-25 MED ORDER — SODIUM CHLORIDE 0.9 % IV SOLN: 250.0000 mL | INTRAVENOUS | Status: DC | PRN

## 2020-02-25 MED ORDER — CARVEDILOL 3.125 MG PO TABS
3.1250 mg | ORAL_TABLET | Freq: Two times a day (BID) | ORAL | Status: DC
  Filled 2020-02-25: qty 1

## 2020-02-25 MED ORDER — ENOXAPARIN SODIUM 40 MG/0.4ML ~~LOC~~ SOLN
40.0000 mg | SUBCUTANEOUS | Status: DC
  Administered 2020-02-26: 09:00:00 40 mg via SUBCUTANEOUS
  Filled 2020-02-25: qty 0.4

## 2020-02-25 MED ORDER — SODIUM CHLORIDE 0.9% FLUSH: 3.0000 mL | Freq: Two times a day (BID) | INTRAVENOUS | Status: DC

## 2020-02-25 MED ORDER — ASPIRIN 81 MG PO CHEW
81.0000 mg | CHEWABLE_TABLET | Freq: Every day | ORAL | Status: DC
  Administered 2020-02-26 – 2020-02-27 (×2): 81 mg via ORAL
  Filled 2020-02-25 (×2): qty 1

## 2020-02-25 MED ORDER — VERAPAMIL HCL 2.5 MG/ML IV SOLN
INTRAVENOUS | Status: AC
  Filled 2020-02-25: qty 2

## 2020-02-25 MED ORDER — ATORVASTATIN CALCIUM 80 MG PO TABS: 80.0000 mg | ORAL_TABLET | Freq: Every day | ORAL | Status: DC

## 2020-02-25 MED ORDER — HEPARIN SODIUM (PORCINE) 1000 UNIT/ML IJ SOLN
INTRAMUSCULAR | Status: AC
  Filled 2020-02-25: qty 1

## 2020-02-25 MED ORDER — MIDAZOLAM HCL 2 MG/2ML IJ SOLN
INTRAMUSCULAR | Status: AC
  Filled 2020-02-25: qty 2

## 2020-02-25 MED ORDER — IOHEXOL 300 MG/ML  SOLN
INTRAMUSCULAR | Status: DC | PRN
  Administered 2020-02-25: 17:00:00 35 mL

## 2020-02-25 MED ORDER — SODIUM CHLORIDE 0.9 % IV SOLN: INTRAVENOUS | Status: DC

## 2020-02-25 MED ORDER — ASPIRIN 81 MG PO CHEW: 81.0000 mg | CHEWABLE_TABLET | ORAL | Status: DC

## 2020-02-25 MED ORDER — HEPARIN (PORCINE) IN NACL 1000-0.9 UT/500ML-% IV SOLN
INTRAVENOUS | Status: DC | PRN
  Administered 2020-02-25: 500 mL

## 2020-02-25 SURGICAL SUPPLY — 8 items
CATH BALLN WEDGE 5F 110CM (CATHETERS) ×3 IMPLANT
CATH INFINITI 5FR JK (CATHETERS) ×3 IMPLANT
DEVICE RAD TR BAND REGULAR (VASCULAR PRODUCTS) ×3 IMPLANT
GLIDESHEATH SLEND SS 6F .021 (SHEATH) ×3 IMPLANT
KIT MANI 3VAL PERCEP (MISCELLANEOUS) ×3 IMPLANT
PACK CARDIAC CATH (CUSTOM PROCEDURE TRAY) ×3 IMPLANT
SHEATH GLIDE SLENDER 4/5FR (SHEATH) ×3 IMPLANT
WIRE ROSEN-J .035X260CM (WIRE) ×3 IMPLANT

## 2020-02-25 NOTE — Progress Notes (Signed)
Progress Note  Patient Name: Hannah Martinez Date of Encounter: 02/25/2020  Primary Cardiologist: No primary care provider on file. new( seen by Dr. Mariah Milling)  Subjective   She denies chest pain or shortness of breath at the present time.  She reports that she started feeling sick mid February with vomiting and diarrhea.  This was followed by coughing, shortness of breath and chest pain with coughing.  Inpatient Medications    Scheduled Meds: . aspirin  324 mg Oral Daily  . dextromethorphan-guaiFENesin  1 tablet Oral BID  . gabapentin  300 mg Oral TID  . gabapentin  600 mg Oral TID  . ipratropium  2 mL Inhalation Q4H  . pantoprazole  40 mg Oral Daily  . simvastatin  20 mg Oral QHS   Continuous Infusions:  PRN Meds: acetaminophen, albuterol, fluticasone, HYDROcodone-acetaminophen   Vital Signs    Vitals:   02/24/20 1958 02/24/20 2330 02/25/20 0324 02/25/20 0725  BP: 107/71  106/73 108/80  Pulse: (!) 102  94 98  Resp: 20  20 19   Temp: 99.2 F (37.3 C)  98.4 F (36.9 C) 98.4 F (36.9 C)  TempSrc: Oral  Oral Oral  SpO2: 99% 96% 98% 100%  Weight:   76.9 kg   Height:        Intake/Output Summary (Last 24 hours) at 02/25/2020 1208 Last data filed at 02/25/2020 0550 Gross per 24 hour  Intake 857.99 ml  Output 900 ml  Net -42.01 ml   Last 3 Weights 02/25/2020 02/24/2020 11/22/2019  Weight (lbs) 169 lb 9.6 oz 160 lb 155 lb  Weight (kg) 76.93 kg 72.576 kg 70.308 kg      Telemetry    Normal sinus rhythm with sinus tachycardia.  Intermittent PVCs- Personally Reviewed  ECG    Sinus rhythm with anterior infarct- Personally Reviewed  Physical Exam   GEN: No acute distress.   Neck: No JVD Cardiac: RRR, no murmurs, rubs, or gallops.  Respiratory: Clear to auscultation bilaterally. GI: Soft, nontender, non-distended  MS: No edema; No deformity. Neuro:  Nonfocal  Psych: Normal affect   Labs    High Sensitivity Troponin:   Recent Labs  Lab 02/24/20 1345  02/24/20 1531 02/25/20 0727 02/25/20 1059  TROPONINIHS 214* 203* 156* 161*      Chemistry Recent Labs  Lab 02/24/20 1345 02/25/20 0352  NA 140 139  K 3.4* 4.1  CL 111 111  CO2 20* 19*  GLUCOSE 109* 112*  BUN 8 9  CREATININE 0.87 0.81  CALCIUM 8.7* 8.4*  GFRNONAA >60 >60  GFRAA >60 >60  ANIONGAP 9 9     Hematology Recent Labs  Lab 02/24/20 1345 02/25/20 0352  WBC 7.4 7.3  RBC 3.90 3.45*  HGB 12.7 11.2*  HCT 38.9 34.3*  MCV 99.7 99.4  MCH 32.6 32.5  MCHC 32.6 32.7  RDW 12.5 12.7  PLT 354 348    BNPNo results for input(s): BNP, PROBNP in the last 168 hours.   DDimer No results for input(s): DDIMER in the last 168 hours.   Radiology    DG Chest 2 View  Result Date: 02/24/2020 CLINICAL DATA:  Wheezing, headache, cough, shortness of breath EXAM: CHEST - 2 VIEW COMPARISON:  11/22/2019 FINDINGS: There is mild bilateral interstitial thickening. The lungs are mildly hyperinflated. There is no focal consolidation. There is no pleural effusion or pneumothorax. The cardiomediastinal silhouette is stable. There is no acute osseous abnormality. IMPRESSION: No acute cardiopulmonary disease. Electronically Signed   By: Alan Ripper  Patel   On: 02/24/2020 13:57   CT ANGIO CHEST PE W OR WO CONTRAST  Result Date: 02/24/2020 CLINICAL DATA:  Acute shortness of breath and weakness EXAM: CT ANGIOGRAPHY CHEST WITH CONTRAST TECHNIQUE: Multidetector CT imaging of the chest was performed using the standard protocol during bolus administration of intravenous contrast. Multiplanar CT image reconstructions and MIPs were obtained to evaluate the vascular anatomy. CONTRAST:  9mL OMNIPAQUE IOHEXOL 350 MG/ML SOLN COMPARISON:  Chest x-ray from earlier in the same day. FINDINGS: Cardiovascular: Thoracic aorta demonstrates a normal branching pattern. No significant atherosclerotic calcifications are seen. No aneurysmal dilatation is noted. No cardiac enlargement is seen. The pulmonary artery shows a normal  branching pattern. No filling defects to suggest pulmonary emboli are identified. Mediastinum/Nodes: Small sliding-type hiatal hernia is noted. No sizable hilar or mediastinal adenopathy is noted. The thoracic inlet appears within normal limits. Lungs/Pleura: Small pleural effusions are noted bilaterally. Minimal right basilar atelectasis is noted. No focal confluent infiltrate is seen. No sizable parenchymal nodules are noted. Mild changes of vascular congestion are seen. Upper Abdomen: Visualized upper abdomen is within normal limits. Musculoskeletal: No chest wall abnormality. No acute or significant osseous findings. Review of the MIP images confirms the above findings. IMPRESSION: No evidence of pulmonary emboli. Small bilateral pleural effusions and mild right basilar atelectasis. Mild changes of vascular congestion. Diffuse coronary calcifications. Electronically Signed   By: Alcide Clever M.D.   On: 02/24/2020 20:57   ECHOCARDIOGRAM COMPLETE  Result Date: 02/25/2020    ECHOCARDIOGRAM REPORT   Patient Name:   Hannah Martinez Date of Exam: 02/25/2020 Medical Rec #:  [id number]       Height:       65.0 in Accession #:    [id number]      Weight:       169.6 lb Date of Birth:  08-24-1956       BSA:          1.844 m Patient Age:    64 years        BP:           108/80 mmHg Patient Gender: F               HR:           98 bpm. Exam Location:  [facility] Procedure: 2D Echo, Cardiac Doppler, Color Doppler and Intracardiac            Opacification Agent Indications:     Chest pain 786.50  History:         Patient has no prior history of Echocardiogram examinations.                  Stroke.  Sonographer:     Cristela Blue RDCS (AE) Referring Phys:  [id number] Brien Few NIU Diagnosing Phys: Lorine Bears MD IMPRESSIONS  1. Left ventricular ejection fraction, by estimation, is 20 to 25%. The left ventricle has severely decreased function. The left ventricle demonstrates regional wall motion abnormalities (see scoring diagram/findings for  description). The left ventricular internal cavity size was mildly dilated. Left ventricular diastolic parameters are consistent with Grade II diastolic dysfunction (pseudonormalization). There is akinesis of the left ventricular, mid-apical anteroseptal wall, anterior segment  and apical segment. Prominent apical trabeculation with no clear apical thrombus with echo contrast.  2. Right ventricular systolic function is normal. The right ventricular size is normal. Tricuspid regurgitation signal is inadequate for assessing PA pressure.  3. Left atrial size was mildly dilated.  4. The  mitral valve is normal in structure and function. Mild to moderate mitral valve regurgitation. No evidence of mitral stenosis.  5. The aortic valve is normal in structure and function. Aortic valve regurgitation is not visualized. No aortic stenosis is present. FINDINGS  Left Ventricle: Left ventricular ejection fraction, by estimation, is 20 to 25%. The left ventricle has severely decreased function. The left ventricle demonstrates regional wall motion abnormalities. Definity contrast agent was given IV to delineate the left ventricular endocardial borders. The left ventricular internal cavity size was mildly dilated. There is no left ventricular hypertrophy. Left ventricular diastolic parameters are consistent with Grade II diastolic dysfunction (pseudonormalization). Right Ventricle: The right ventricular size is normal. No increase in right ventricular wall thickness. Right ventricular systolic function is normal. Tricuspid regurgitation signal is inadequate for assessing PA pressure. The tricuspid regurgitant velocity is 2.54 m/s, and with an assumed right atrial pressure of 10 mmHg, the estimated right ventricular systolic pressure is 94.1 mmHg. Left Atrium: Left atrial size was mildly dilated. Right Atrium: Right atrial size was normal in size. Pericardium: There is no evidence of pericardial effusion. Mitral Valve: The mitral  valve is normal in structure and function. Normal mobility of the mitral valve leaflets. Mild to moderate mitral valve regurgitation. No evidence of mitral valve stenosis. Tricuspid Valve: The tricuspid valve is normal in structure. Tricuspid valve regurgitation is trivial. No evidence of tricuspid stenosis. Aortic Valve: The aortic valve is normal in structure and function. Aortic valve regurgitation is not visualized. No aortic stenosis is present. Aortic valve mean gradient measures 2.0 mmHg. Aortic valve peak gradient measures 3.6 mmHg. Aortic valve area, by VTI measures 2.24 cm. Pulmonic Valve: The pulmonic valve was normal in structure. Pulmonic valve regurgitation is not visualized. No evidence of pulmonic stenosis. Aorta: The aortic root is normal in size and structure. Venous: The inferior vena cava was not well visualized. IAS/Shunts: No atrial level shunt detected by color flow Doppler.  LEFT VENTRICLE PLAX 2D LVIDd:         5.09 cm     Diastology LVIDs:         4.52 cm     LV e' lateral:   6.74 cm/s LV PW:         1.20 cm     LV E/e' lateral: 16.6 LV IVS:        0.84 cm     LV e' medial:    4.35 cm/s LVOT diam:     2.00 cm     LV E/e' medial:  25.7 LV SV:         42 LV SV Index:   23 LVOT Area:     3.14 cm  LV Volumes (MOD) LV vol d, MOD A2C: 98.5 ml LV vol d, MOD A4C: 99.0 ml LV vol s, MOD A2C: 82.4 ml LV vol s, MOD A4C: 79.4 ml LV SV MOD A2C:     16.1 ml LV SV MOD A4C:     99.0 ml LV SV MOD BP:      13.9 ml RIGHT VENTRICLE RV Basal diam:  2.80 cm RV S prime:     11.30 cm/s TAPSE (M-mode): 3.0 cm LEFT ATRIUM           Index LA diam:      3.20 cm 1.74 cm/m LA Vol (A2C): 51.1 ml 27.71 ml/m  AORTIC VALVE                   PULMONIC VALVE  AV Area (Vmax):    1.99 cm    PV Vmax:        0.72 m/s AV Area (Vmean):   1.92 cm    PV Peak grad:   2.1 mmHg AV Area (VTI):     2.24 cm    RVOT Peak grad: 1 mmHg AV Vmax:           94.70 cm/s AV Vmean:          69.267 cm/s AV VTI:            0.190 m AV Peak Grad:       3.6 mmHg AV Mean Grad:      2.0 mmHg LVOT Vmax:         59.90 cm/s LVOT Vmean:        42.300 cm/s LVOT VTI:          0.135 m LVOT/AV VTI ratio: 0.71  AORTA Ao Root diam: 2.30 cm MITRAL VALVE                TRICUSPID VALVE MV Area (PHT): 5.75 cm     TR Peak grad:   25.8 mmHg MV Decel Time: 132 msec     TR Vmax:        254.00 cm/s MV E velocity: 112.00 cm/s MV A velocity: 86.30 cm/s   SHUNTS MV E/A ratio:  1.30         Systemic VTI:  0.14 m                             Systemic Diam: 2.00 cm Lorine Bears MD Electronically signed by Lorine Bears MD Signature Date/Time: 02/25/2020/11:30:53 AM    Final     Cardiac Studies   I personally reviewed echocardiogram which was done today:  1. Left ventricular ejection fraction, by estimation, is 20 to 25%. The  left ventricle has severely decreased function. The left ventricle  demonstrates regional wall motion abnormalities (see scoring  diagram/findings for description). The left  ventricular internal cavity size was mildly dilated. Left ventricular  diastolic parameters are consistent with Grade II diastolic dysfunction  (pseudonormalization). There is akinesis of the left ventricular,  mid-apical anteroseptal wall, anterior segment  and apical segment. Prominent apical trabeculation with no clear apical  thrombus with echo contrast.  2. Right ventricular systolic function is normal. The right ventricular  size is normal. Tricuspid regurgitation signal is inadequate for assessing  PA pressure.  3. Left atrial size was mildly dilated.  4. The mitral valve is normal in structure and function. Mild to moderate  mitral valve regurgitation. No evidence of mitral stenosis.  5. The aortic valve is normal in structure and function. Aortic valve  regurgitation is not visualized. No aortic stenosis is present.   Patient Profile     64 y.o. female  with a hx of stroke, cerebral aneurysm s/p clipping, chronic pain, hyperlipidemia presenting with  nonproductive cough, wheezing (chest discomfort which she feels is from the coughing).  Echocardiogram showed severely reduced LV systolic function  Assessment & Plan    1.  Suspected late presenting anterior ST elevation myocardial infarction based on EKG changes and echocardiogram.  Her presentation is very atypical with lots of GI symptoms.  It is difficult to know if the symptoms were related to her myocardial infarction or completely unrelated.  Nonetheless, given significant abnormality and wall motion noted on echocardiogram, I recommend proceeding with cardiac catheterization  possible PCI.  I discussed the procedure in details as well as risks and benefits.  2.  Acute systolic heart failure: Likely due to ischemic cardiomyopathy.  We will do a right heart catheterization at the same time.  She will need to be started on optimal heart failure therapy including beta-blocker, ACE inhibitor/ARB and spironolactone.  This can be started after cardiac cath.  3.  Possible apical thrombus: This was suspected on plain echo images.  However, contrast was given and there was no clear evidence of LV thrombus.  I answered all the patient's questions and she asked me to call her son and updated him which I did.     For questions or updates, please contact [facility] Please consult [email or web address] for contact info under        Signed, Lorine Bears, MD  02/25/2020, 12:08 PM

## 2020-02-25 NOTE — Progress Notes (Addendum)
PROGRESS NOTE    Hannah Martinez  PXT:[id number] DOB: [birth year] DOA: 02/24/2020  PCP: [facility]    LOS - 1   Brief Narrative:  Hannah Martinez is a 64 y.o. female with medical history significant of hyperlipidemia, stroke, GERD, cerebral aneurysm (s/p of clipping), who presented to the ED on 2/28 with shortness breath and chest pain which had been ongoing for several days, but acutely worsened on day of presentation.  She reported having a URI and frequent coughing spells, chest pain worse with coughing.  In the ED, vitals stable.  Labs notable for elevated troponin 214, K 3.4.  Chest xray negative.  Patient admitted to hospitalist service with cardiology consulted.  Echo was obtained and showed EF of 20-25%, grade 2 diastolic dysfunction, and significant wall motion abnormalities.  Patient's chest pain resolved by following morning, and not recurring outside of coughing spells, per patient.  Patient taken for left and right heart catheterization today.   Subjective 3/1: Patient seen this AM at bedside.  Reports feeling really well, and wanting to go home today.  Echo not done at the time.  She had ongoing coughing, but denied wheezing or SOB.  No fevers or chills.    Echo showed significantly reduced EF, patient agreed to stay for heart cath recommended by cardiology.  Assessment & Plan:   Principal Problem:   Chest pain Active Problems:   Elevated troponin   Stroke (HCC)   GERD (gastroesophageal reflux disease)   Hypokalemia   Nausea vomiting and diarrhea   Viral syndrome  Chest pain, SOB and elevated trop: trop 214 -->203-->158.  Possibly musculoskeletal in setting of URI and coughing past several days, however Echo showed EF on 20-25%, so ischemic etiology is a possibility.  CTA chest negative for PE.  Chest pain has resolved and not recurred. --cardiology following --for right and left heart cath today --stopped heparin this AM  --Norco  PRN --continue ASA, statin --will need optimal therapy HFrEF with beta blocker, ACEI, spironolactone   Hx of Stroke (HCC) --continue ASA and zocor  GERD (gastroesophageal reflux disease): --continue PPI  Hypokalemia: K 3.4, replaced --monitor BMP --replaced as needed for goal of K>4.0 given low EF  Nausea vomiting and diarrhea - C diff negative.  Symptoms resolved. --stop fluids   DVT prophylaxis: heparin   Code Status: Full Code  Family Communication: none at bedside during encounter   Disposition Plan:  D/c home pending clearance by cardiology Coming From home Exp DC Date 3/2  Barriers cardiology evaluation as above Medically Stable for Discharge? no   Severity of Illness: The appropriate patient status for this patient is INPATIENT. It is not anticipated that the patient will be medically stable for discharge from the hospital within 2 midnights of admission.  The following factors support the patient status of inpatient: --Presenting symptoms include chest pain. --Worrisome physical exam findings include tachycardia, transient hypotension, tachypnea. --Initial radiographic and laboratory data are worrisome because of elevated troponin. --Chronic co-morbidities include history of stroke, history of cerebral aneurysm s/p clipping, GERD. Further evaluation and management in hospital, as outlined above, is warranted because Echo showed severely reduced LV function with EF 20-25%, therefore requiring further cardiac work-up including heart catheterization.   Consultants:   Cardiology  Procedures:   Echo 3/1 - EF 20-25%, grade 2 diastolic dysfunction, significant wall motion abnormaltities  Antimicrobials:   none    Objective: Vitals:   02/24/20 2330 02/25/20 0324 02/25/20 0725 02/25/20 1455  BP:  106/73 108/80 107/88  Pulse:  94 98 (!) 103  Resp:  20 19 18   Temp:  98.4 F (36.9 C) 98.4 F (36.9 C) 98.1 F (36.7 C)  TempSrc:  Oral Oral Oral  SpO2: 96%  98% 100% 95%  Weight:  76.9 kg  76.9 kg  Height:    5\' 5"  (1.651 m)    Intake/Output Summary (Last 24 hours) at 02/25/2020 1515 Last data filed at 02/25/2020 0550 Gross per 24 hour  Intake 857.99 ml  Output 900 ml  Net -42.01 ml   Filed Weights   02/24/20 1318 02/25/20 0324 02/25/20 1455  Weight: 72.6 kg 76.9 kg 76.9 kg    Examination:  General exam: awake, alert, no acute distress HEENT: moist mucus membranes, hearing grossly normal  Respiratory system: CTAB, no wheezes, rales or rhonchi, normal respiratory effort. Cardiovascular system: normal I2/L7, RRR, 2/6 systolic murmur, no pedal edema.   Gastrointestinal system: soft, NT, ND, no HSM felt, +bowel sounds. Central nervous system: A&O x4. no gross focal neurologic deficits, normal speech Extremities: moves all, no edema, normal tone Skin: dry, intact, normal temperature, normal color Psychiatry: normal mood, congruent affect, judgement and insight appear normal    Data Reviewed: I have personally reviewed following labs and imaging studies  CBC: Recent Labs  Lab 02/24/20 1345 02/25/20 0352  WBC 7.4 7.3  HGB 12.7 11.2*  HCT 38.9 34.3*  MCV 99.7 99.4  PLT 354 989   Basic Metabolic Panel: Recent Labs  Lab 02/24/20 1345 02/25/20 0352  NA 140 139  K 3.4* 4.1  CL 111 111  CO2 20* 19*  GLUCOSE 109* 112*  BUN 8 9  CREATININE 0.87 0.81  CALCIUM 8.7* 8.4*   GFR: Estimated Creatinine Clearance: 73.9 mL/min (by C-G formula based on SCr of 0.81 mg/dL). Liver Function Tests: No results for input(s): AST, ALT, ALKPHOS, BILITOT, PROT, ALBUMIN in the last 168 hours. No results for input(s): LIPASE, AMYLASE in the last 168 hours. No results for input(s): AMMONIA in the last 168 hours. Coagulation Profile: Recent Labs  Lab 02/24/20 1531  INR 1.2   Cardiac Enzymes: No results for input(s): CKTOTAL, CKMB, CKMBINDEX, TROPONINI in the last 168 hours. BNP (last 3 results) No results for input(s): PROBNP in the last  8760 hours. HbA1C: Recent Labs    02/25/20 0352  HGBA1C 4.9   CBG: No results for input(s): GLUCAP in the last 168 hours. Lipid Profile: Recent Labs    02/25/20 0352  CHOL 181  HDL 45  LDLCALC 123*  TRIG 64  CHOLHDL 4.0   Thyroid Function Tests: No results for input(s): TSH, T4TOTAL, FREET4, T3FREE, THYROIDAB in the last 72 hours. Anemia Panel: No results for input(s): VITAMINB12, FOLATE, FERRITIN, TIBC, IRON, RETICCTPCT in the last 72 hours. Sepsis Labs: No results for input(s): PROCALCITON, LATICACIDVEN in the last 168 hours.  Recent Results (from the past 240 hour(s))  Respiratory Panel by RT PCR (Flu A&B, Covid) - Nasopharyngeal Swab     Status: None   Collection Time: 02/24/20  3:31 PM   Specimen: Nasopharyngeal Swab  Result Value Ref Range Status   SARS Coronavirus 2 by RT PCR NEGATIVE NEGATIVE Final    Comment: (NOTE) SARS-CoV-2 target nucleic acids are NOT DETECTED. The SARS-CoV-2 RNA is generally detectable in upper respiratoy specimens during the acute phase of infection. The lowest concentration of SARS-CoV-2 viral copies this assay can detect is 131 copies/mL. A negative result does not preclude SARS-Cov-2 infection and should not be used  as the sole basis for treatment or other patient management decisions. A negative result may occur with  improper specimen collection/handling, submission of specimen other than nasopharyngeal swab, presence of viral mutation(s) within the areas targeted by this assay, and inadequate number of viral copies (<131 copies/mL). A negative result must be combined with clinical observations, patient history, and epidemiological information. The expected result is Negative. Fact Sheet for Patients:  https://www.moore.com/ Fact Sheet for Healthcare Providers:  https://www.young.biz/ This test is not yet ap proved or cleared by the [place] FDA and  has been authorized for detection  and/or diagnosis of SARS-CoV-2 by FDA under an Emergency Use Authorization (EUA). This EUA will remain  in effect (meaning this test can be used) for the duration of the COVID-19 declaration under Section 564(b)(1) of the Act, 21 U.S.C. section 360bbb-3(b)(1), unless the authorization is terminated or revoked sooner.    Influenza A by PCR NEGATIVE NEGATIVE Final   Influenza B by PCR NEGATIVE NEGATIVE Final    Comment: (NOTE) The Xpert Xpress SARS-CoV-2/FLU/RSV assay is intended as an aid in  the diagnosis of influenza from Nasopharyngeal swab specimens and  should not be used as a sole basis for treatment. Nasal washings and  aspirates are unacceptable for Xpert Xpress SARS-CoV-2/FLU/RSV  testing. Fact Sheet for Patients: https://www.moore.com/ Fact Sheet for Healthcare Providers: https://www.young.biz/ This test is not yet approved or cleared by the [place] FDA and  has been authorized for detection and/or diagnosis of SARS-CoV-2 by  FDA under an Emergency Use Authorization (EUA). This EUA will remain  in effect (meaning this test can be used) for the duration of the  Covid-19 declaration under Section 564(b)(1) of the Act, 21  U.S.C. section 360bbb-3(b)(1), unless the authorization is  terminated or revoked. Performed at [facility], [address] [place], [place] [address]   C difficile quick scan w PCR reflex     Status: None   Collection Time: 02/24/20 11:06 PM   Specimen: STOOL  Result Value Ref Range Status   C Diff antigen NEGATIVE NEGATIVE Final   C Diff toxin NEGATIVE NEGATIVE Final   C Diff interpretation No C. difficile detected.  Final    Comment: Performed at [facility], [address], [place], [place] [address]         Radiology Studies: DG Chest 2 View  Result Date: 02/24/2020 CLINICAL DATA:  Wheezing, headache, cough, shortness of breath EXAM: CHEST - 2 VIEW COMPARISON:  11/22/2019  FINDINGS: There is mild bilateral interstitial thickening. The lungs are mildly hyperinflated. There is no focal consolidation. There is no pleural effusion or pneumothorax. The cardiomediastinal silhouette is stable. There is no acute osseous abnormality. IMPRESSION: No acute cardiopulmonary disease. Electronically Signed   By: Elige Ko   On: 02/24/2020 13:57   CT ANGIO CHEST PE W OR WO CONTRAST  Result Date: 02/24/2020 CLINICAL DATA:  Acute shortness of breath and weakness EXAM: CT ANGIOGRAPHY CHEST WITH CONTRAST TECHNIQUE: Multidetector CT imaging of the chest was performed using the standard protocol during bolus administration of intravenous contrast. Multiplanar CT image reconstructions and MIPs were obtained to evaluate the vascular anatomy. CONTRAST:  75mL OMNIPAQUE IOHEXOL 350 MG/ML SOLN COMPARISON:  Chest x-ray from earlier in the same day. FINDINGS: Cardiovascular: Thoracic aorta demonstrates a normal branching pattern. No significant atherosclerotic calcifications are seen. No aneurysmal dilatation is noted. No cardiac enlargement is seen. The pulmonary artery shows a normal branching pattern. No filling defects to suggest pulmonary emboli are  identified. Mediastinum/Nodes: Small sliding-type hiatal hernia is noted. No sizable hilar or mediastinal adenopathy is noted. The thoracic inlet appears within normal limits. Lungs/Pleura: Small pleural effusions are noted bilaterally. Minimal right basilar atelectasis is noted. No focal confluent infiltrate is seen. No sizable parenchymal nodules are noted. Mild changes of vascular congestion are seen. Upper Abdomen: Visualized upper abdomen is within normal limits. Musculoskeletal: No chest wall abnormality. No acute or significant osseous findings. Review of the MIP images confirms the above findings. IMPRESSION: No evidence of pulmonary emboli. Small bilateral pleural effusions and mild right basilar atelectasis. Mild changes of vascular congestion.  Diffuse coronary calcifications. Electronically Signed   By: Alcide Clever M.D.   On: 02/24/2020 20:57   ECHOCARDIOGRAM COMPLETE  Result Date: 02/25/2020    ECHOCARDIOGRAM REPORT   Patient Name:   ALEISHA PAONE Date of Exam: 02/25/2020 Medical Rec #:  [id number]       Height:       65.0 in Accession #:    [id number]      Weight:       169.6 lb Date of Birth:  January 28, 1956       BSA:          1.844 m Patient Age:    63 years        BP:           108/80 mmHg Patient Gender: F               HR:           98 bpm. Exam Location:  [facility] Procedure: 2D Echo, Cardiac Doppler, Color Doppler and Intracardiac            Opacification Agent Indications:     Chest pain 786.50  History:         Patient has no prior history of Echocardiogram examinations.                  Stroke.  Sonographer:     Cristela Blue RDCS (AE) Referring Phys:  [id number] Brien Few NIU Diagnosing Phys: Lorine Bears MD IMPRESSIONS  1. Left ventricular ejection fraction, by estimation, is 20 to 25%. The left ventricle has severely decreased function. The left ventricle demonstrates regional wall motion abnormalities (see scoring diagram/findings for description). The left ventricular internal cavity size was mildly dilated. Left ventricular diastolic parameters are consistent with Grade II diastolic dysfunction (pseudonormalization). There is akinesis of the left ventricular, mid-apical anteroseptal wall, anterior segment  and apical segment. Prominent apical trabeculation with no clear apical thrombus with echo contrast.  2. Right ventricular systolic function is normal. The right ventricular size is normal. Tricuspid regurgitation signal is inadequate for assessing PA pressure.  3. Left atrial size was mildly dilated.  4. The mitral valve is normal in structure and function. Mild to moderate mitral valve regurgitation. No evidence of mitral stenosis.  5. The aortic valve is normal in structure and function. Aortic valve regurgitation is not visualized. No aortic  stenosis is present. FINDINGS  Left Ventricle: Left ventricular ejection fraction, by estimation, is 20 to 25%. The left ventricle has severely decreased function. The left ventricle demonstrates regional wall motion abnormalities. Definity contrast agent was given IV to delineate the left ventricular endocardial borders. The left ventricular internal cavity size was mildly dilated. There is no left ventricular hypertrophy. Left ventricular diastolic parameters are consistent with Grade II diastolic dysfunction (pseudonormalization). Right Ventricle: The right ventricular size is normal. No increase in right ventricular wall thickness. Right  ventricular systolic function is normal. Tricuspid regurgitation signal is inadequate for assessing PA pressure. The tricuspid regurgitant velocity is 2.54 m/s, and with an assumed right atrial pressure of 10 mmHg, the estimated right ventricular systolic pressure is 35.8 mmHg. Left Atrium: Left atrial size was mildly dilated. Right Atrium: Right atrial size was normal in size. Pericardium: There is no evidence of pericardial effusion. Mitral Valve: The mitral valve is normal in structure and function. Normal mobility of the mitral valve leaflets. Mild to moderate mitral valve regurgitation. No evidence of mitral valve stenosis. Tricuspid Valve: The tricuspid valve is normal in structure. Tricuspid valve regurgitation is trivial. No evidence of tricuspid stenosis. Aortic Valve: The aortic valve is normal in structure and function. Aortic valve regurgitation is not visualized. No aortic stenosis is present. Aortic valve mean gradient measures 2.0 mmHg. Aortic valve peak gradient measures 3.6 mmHg. Aortic valve area, by VTI measures 2.24 cm. Pulmonic Valve: The pulmonic valve was normal in structure. Pulmonic valve regurgitation is not visualized. No evidence of pulmonic stenosis. Aorta: The aortic root is normal in size and structure. Venous: The inferior vena cava was not well  visualized. IAS/Shunts: No atrial level shunt detected by color flow Doppler.  LEFT VENTRICLE PLAX 2D LVIDd:         5.09 cm     Diastology LVIDs:         4.52 cm     LV e' lateral:   6.74 cm/s LV PW:         1.20 cm     LV E/e' lateral: 16.6 LV IVS:        0.84 cm     LV e' medial:    4.35 cm/s LVOT diam:     2.00 cm     LV E/e' medial:  25.7 LV SV:         42 LV SV Index:   23 LVOT Area:     3.14 cm  LV Volumes (MOD) LV vol d, MOD A2C: 98.5 ml LV vol d, MOD A4C: 99.0 ml LV vol s, MOD A2C: 82.4 ml LV vol s, MOD A4C: 79.4 ml LV SV MOD A2C:     16.1 ml LV SV MOD A4C:     99.0 ml LV SV MOD BP:      13.9 ml RIGHT VENTRICLE RV Basal diam:  2.80 cm RV S prime:     11.30 cm/s TAPSE (M-mode): 3.0 cm LEFT ATRIUM           Index LA diam:      3.20 cm 1.74 cm/m LA Vol (A2C): 51.1 ml 27.71 ml/m  AORTIC VALVE                   PULMONIC VALVE AV Area (Vmax):    1.99 cm    PV Vmax:        0.72 m/s AV Area (Vmean):   1.92 cm    PV Peak grad:   2.1 mmHg AV Area (VTI):     2.24 cm    RVOT Peak grad: 1 mmHg AV Vmax:           94.70 cm/s AV Vmean:          69.267 cm/s AV VTI:            0.190 m AV Peak Grad:      3.6 mmHg AV Mean Grad:      2.0 mmHg LVOT Vmax:  59.90 cm/s LVOT Vmean:        42.300 cm/s LVOT VTI:          0.135 m LVOT/AV VTI ratio: 0.71  AORTA Ao Root diam: 2.30 cm MITRAL VALVE                TRICUSPID VALVE MV Area (PHT): 5.75 cm     TR Peak grad:   25.8 mmHg MV Decel Time: 132 msec     TR Vmax:        254.00 cm/s MV E velocity: 112.00 cm/s MV A velocity: 86.30 cm/s   SHUNTS MV E/A ratio:  1.30         Systemic VTI:  0.14 m                             Systemic Diam: 2.00 cm Lorine Bears MD Electronically signed by Lorine Bears MD Signature Date/Time: 02/25/2020/11:30:53 AM    Final         Scheduled Meds: . [MAR Hold] aspirin  324 mg Oral Daily  . [START ON 02/26/2020] aspirin  81 mg Oral Pre-Cath  . [MAR Hold] dextromethorphan-guaiFENesin  1 tablet Oral BID  . [MAR Hold] gabapentin  300 mg  Oral TID  . [MAR Hold] gabapentin  600 mg Oral TID  . [MAR Hold] pantoprazole  40 mg Oral Daily  . [MAR Hold] simvastatin  20 mg Oral QHS  . sodium chloride flush  3 mL Intravenous Q12H   Continuous Infusions: . sodium chloride    . [START ON 02/26/2020] sodium chloride       LOS: 1 day    Time spent: 40 minutes    Pennie Banter, DO [facility]   If 7PM-7AM, please contact night-coverage [email or web address] 02/25/2020, 3:15 PM

## 2020-02-25 NOTE — Progress Notes (Signed)
*  PRELIMINARY RESULTS* Echocardiogram 2D Echocardiogram has been performed.  Cristela Blue 02/25/2020, 10:51 AM

## 2020-02-25 NOTE — Progress Notes (Signed)
Noted pt. On arrival to have left hand flaccid; able to raise left arm about 1/2 way. Able to move all other extremities. Speech clear. Pt. States "that is from my stroke." NPO for cath. Stable for procedure.

## 2020-02-25 NOTE — Plan of Care (Signed)
  Problem: Cardiac: Goal: Ability to achieve and maintain adequate cardiopulmonary perfusion will improve Outcome: Progressing  Pain free.  No ectopics on telemetry.

## 2020-02-25 NOTE — Consult Note (Signed)
ANTICOAGULATION CONSULT NOTE   Pharmacy Consult for Heparin drip Indication: chest pain/ACS/STEMI  Allergies  Allergen Reactions  . Lipitor [Atorvastatin] Hives    Patient Measurements: Height: 5\' 5"  (165.1 cm) Weight: 169 lb 9.6 oz (76.9 kg) IBW/kg (Calculated) : 57 Heparin Dosing Weight: 71.6kg  Vital Signs: Temp: 98.4 F (36.9 C) (03/01 0324) Temp Source: Oral (03/01 0324) BP: 106/73 (03/01 0324) Pulse Rate: 94 (03/01 0324)  Labs: Recent Labs    02/24/20 1345 02/24/20 1531 02/24/20 2136 02/25/20 0352  HGB 12.7  --   --  11.2*  HCT 38.9  --   --  34.3*  PLT 354  --   --  348  APTT  --  >160*  --   --   LABPROT  --  14.7  --   --   INR  --  1.2  --   --   HEPARINUNFRC  --   --  0.48 0.39  CREATININE 0.87  --   --   --   TROPONINIHS 214* 203*  --   --     Estimated Creatinine Clearance: 68.8 mL/min (by C-G formula based on SCr of 0.87 mg/dL).   Medical History: Past Medical History:  Diagnosis Date  . Cerebral aneurysm   . Chronic pain   . Degenerative joint disease   . Hypercholesteremia   . Stroke (HCC)   . TMJ (dislocation of temporomandibular joint)     Medications:  No PTA anticoagulant of record  Assessment: 64 yo female with hx of stroke presents with SOB/cough/chest pain.  Troponins 214.  Pharmacy has been consulted to initiate and monitor heparin drip.  2/28 HL @ 2136 0.48. Therapeutic x1.   Goal of Therapy:  Heparin level 0.3-0.7 units/ml Monitor platelets by anticoagulation protocol: Yes   Plan:  03/01 @ 0400 HL 0.39 therapeutic. Will continue current rate and will recheck HL and CBC w/ am labs and continue to monitor.  05/01, PharmD Clinical Pharmacist 02/25/2020 4:53 AM

## 2020-02-26 ENCOUNTER — Encounter: Payer: Self-pay | Admitting: Cardiology

## 2020-02-26 DIAGNOSIS — I509 Heart failure, unspecified: Secondary | ICD-10-CM

## 2020-02-26 DIAGNOSIS — I25118 Atherosclerotic heart disease of native coronary artery with other forms of angina pectoris: Secondary | ICD-10-CM

## 2020-02-26 DIAGNOSIS — I248 Other forms of acute ischemic heart disease: Secondary | ICD-10-CM

## 2020-02-26 LAB — CBC
HCT: 43.9 % (ref 36.0–46.0)
Hemoglobin: 14.1 g/dL (ref 12.0–15.0)
MCH: 32.3 pg (ref 26.0–34.0)
MCHC: 32.1 g/dL (ref 30.0–36.0)
MCV: 100.7 fL — ABNORMAL HIGH (ref 80.0–100.0)
Platelets: 359 10*3/uL (ref 150–400)
RBC: 4.36 MIL/uL (ref 3.87–5.11)
RDW: 12.7 % (ref 11.5–15.5)
WBC: 5.6 10*3/uL (ref 4.0–10.5)
nRBC: 0 % (ref 0.0–0.2)

## 2020-02-26 LAB — BASIC METABOLIC PANEL
Anion gap: 13 (ref 5–15)
BUN: 9 mg/dL (ref 8–23)
CO2: 23 mmol/L (ref 22–32)
Calcium: 9.3 mg/dL (ref 8.9–10.3)
Chloride: 104 mmol/L (ref 98–111)
Creatinine, Ser: 1.08 mg/dL — ABNORMAL HIGH (ref 0.44–1.00)
GFR calc Af Amer: >60 mL/min (ref >60)
GFR calc non Af Amer: 55 mL/min — ABNORMAL LOW (ref >60)
Glucose, Bld: 111 mg/dL — ABNORMAL HIGH (ref 70–99)
Potassium: 3.6 mmol/L (ref 3.5–5.1)
Sodium: 140 mmol/L (ref 135–145)

## 2020-02-26 MED ORDER — LOSARTAN POTASSIUM 25 MG PO TABS
12.5000 mg | ORAL_TABLET | Freq: Every day | ORAL | Status: DC
  Administered 2020-02-26 – 2020-02-27 (×2): 12.5 mg via ORAL
  Filled 2020-02-26: qty 1

## 2020-02-26 MED ORDER — METOPROLOL SUCCINATE ER 25 MG PO TB24
12.5000 mg | ORAL_TABLET | Freq: Every day | ORAL | Status: DC
  Administered 2020-02-26: 12.5 mg via ORAL
  Filled 2020-02-26: qty 1

## 2020-02-26 NOTE — Plan of Care (Signed)
  Problem: Activity: Goal: Ability to tolerate increased activity will improve Outcome: Progressing   Problem: Health Behavior/Discharge Planning: Goal: Ability to safely manage health-related needs after discharge will improve Outcome: Progressing   Problem: Cardiac: Goal: Ability to achieve and maintain adequate cardiopulmonary perfusion will improve Outcome: Completed/Met Goal: Vascular access site(s) Level 0-1 will be maintained Outcome: Completed/Met

## 2020-02-26 NOTE — Progress Notes (Signed)
PROGRESS NOTE    Hannah Martinez  BDZ:[id number] DOB: 1956-07-24 DOA: 02/24/2020  PCP: [facility]    LOS - 2   Brief Narrative:  Hannah Martinez a 64 y.o.femalewith medical history significant ofhyperlipidemia, stroke, GERD, cerebral aneurysm (s/p ofclipping),who presented to the ED on 2/28 with shortness breath and chest pain which had been ongoing for several days, but acutely worsened on day of presentation.  She reported having a URI and frequent coughing spells, chest pain worse with coughing.  In the ED, vitals stable.  Labs notable for elevated troponin 214, K 3.4.  Chest xray negative.  Patient admitted to hospitalist service with cardiology consulted.  Echo was obtained and showed EF of 20-25%, grade 2 diastolic dysfunction, and significant wall motion abnormalities.  Patient's chest pain resolved by following morning, and not recurring outside of coughing spells, per patient.  Patient underwent left and right heart catheterization on 3/1 which showed she had likely had an LAD infarct a couple weeks prior and now with severe ischemic cardiomyopathy, nonviable anterior wall.  No stents were placed.  Right heart cath also showed severe pulmonary hypertension/volume overload.    Cardiology recommends further diuresis and titrating new medications prior to discharge.  Subjective 3/2: Patient seen at bedside this morning.  No acute events reported overnight.  She reports feeling very well this morning.  Denies any recurrent of chest pain.  Cough has improved with significant diuresis of nearly 3 L.  She denies fevers chills, shortness of breath, or any other acute complaints at this time.  Assessment & Plan:   Principal Problem:   Chest pain Active Problems:   Elevated troponin   Stroke (HCC)   GERD (gastroesophageal reflux disease)   Hypokalemia   NSTEMI (non-ST elevated myocardial infarction) (HCC)   Nausea vomiting and diarrhea   Viral syndrome  Acute systolic heart failure (HCC)   Chest pain, SOB and elevated trop: trop 214 -->203-->158.Echo showed EF on 20-25%.  CTA chest negative for PE.  Chest pain has resolved and not recurred.  Left and right heart cath findings as per narrative above. --cardiology following --Continue diuresis per cardiology --Optimize medical therapy as blood pressure tolerates --continue ASA, rosuvastatin --Reduced beta-blocker and losartan dosing due to low blood pressures --Toprol 12.5 mg daily, losartan 12.5 mg daily --will need optimal therapy HFrEF, and spironolactone when BP tolerates  --monitor electrolytes and renal function closely  Hx ofStroke (HCC) --continue ASA and zocor  GERD (gastroesophageal reflux disease): --continue PPI  Hypokalemia:K 3.4, replaced --monitor BMP --replaced as needed for goal of K>4.0 given low EF  Nausea vomiting and diarrhea - C diff negative.  Symptoms resolved. --stop fluids   DVT prophylaxis: Lovenox   Code Status: Full Code  Family Communication: None at bedside during encounter  Disposition Plan: Expect discharge home in next 24 to 48 hours pending cardiology clearance and stable blood pressures on new medications. Coming From home Exp DC Date 3/3 Barriers as above Medically Stable for Discharge?  No  Consultants:   Cardiology  Procedures:   3/1 -left and right heart catheterization  Antimicrobials:   None   Objective: Vitals:   02/26/20 0352 02/26/20 0716 02/26/20 0919 02/26/20 1106  BP: 96/83 107/81 102/61 (!) 114/57  Pulse: 97 98 (!) 101 99  Resp:  20  19  Temp: 98 F (36.7 C) (!) 97.5 F (36.4 C)  98.3 F (36.8 C)  TempSrc: Oral Oral  Oral  SpO2: 100% 97%  99%  Weight:  Height:        Intake/Output Summary (Last 24 hours) at 02/26/2020 1453 Last data filed at 02/26/2020 0915 Gross per 24 hour  Intake 600 ml  Output 3150 ml  Net -2550 ml   Filed Weights   02/24/20 1318 02/25/20 0324 02/25/20 1455  Weight:  72.6 kg 76.9 kg 76.9 kg    Examination:  General exam: awake, alert, no acute distress HEENT: moist mucus membranes, hearing grossly normal  Respiratory system: CTAB, no wheezes, rales or rhonchi, normal respiratory effort. Cardiovascular system: normal S1/S2, RRR, no pedal edema.   Gastrointestinal system: soft, NT, ND, no HSM felt, +bowel sounds. Central nervous system: A&O x4. no gross focal neurologic deficits, normal speech Extremities: moves all, normal tone Skin: dry, intact, normal temperature, normal color Psychiatry: normal mood, congruent affect, judgement and insight appear normal    Data Reviewed: I have personally reviewed following labs and imaging studies  CBC: Recent Labs  Lab 02/24/20 1345 02/25/20 0352 02/26/20 0628  WBC 7.4 7.3 5.6  HGB 12.7 11.2* 14.1  HCT 38.9 34.3* 43.9  MCV 99.7 99.4 100.7*  PLT 354 348 301   Basic Metabolic Panel: Recent Labs  Lab 02/24/20 1345 02/25/20 0352 02/26/20 0822  NA 140 139 140  K 3.4* 4.1 3.6  CL 111 111 104  CO2 20* 19* 23  GLUCOSE 109* 112* 111*  BUN 8 9 9   CREATININE 0.87 0.81 1.08*  CALCIUM 8.7* 8.4* 9.3   GFR: Estimated Creatinine Clearance: 55.4 mL/min (A) (by C-G formula based on SCr of 1.08 mg/dL (H)). Liver Function Tests: No results for input(s): AST, ALT, ALKPHOS, BILITOT, PROT, ALBUMIN in the last 168 hours. No results for input(s): LIPASE, AMYLASE in the last 168 hours. No results for input(s): AMMONIA in the last 168 hours. Coagulation Profile: Recent Labs  Lab 02/24/20 1531  INR 1.2   Cardiac Enzymes: No results for input(s): CKTOTAL, CKMB, CKMBINDEX, TROPONINI in the last 168 hours. BNP (last 3 results) No results for input(s): PROBNP in the last 8760 hours. HbA1C: Recent Labs    02/25/20 0352  HGBA1C 4.9   CBG: No results for input(s): GLUCAP in the last 168 hours. Lipid Profile: Recent Labs    02/25/20 0352  CHOL 181  HDL 45  LDLCALC 123*  TRIG 64  CHOLHDL 4.0    Thyroid Function Tests: No results for input(s): TSH, T4TOTAL, FREET4, T3FREE, THYROIDAB in the last 72 hours. Anemia Panel: No results for input(s): VITAMINB12, FOLATE, FERRITIN, TIBC, IRON, RETICCTPCT in the last 72 hours. Sepsis Labs: No results for input(s): PROCALCITON, LATICACIDVEN in the last 168 hours.  Recent Results (from the past 240 hour(s))  Respiratory Panel by RT PCR (Flu A&B, Covid) - Nasopharyngeal Swab     Status: None   Collection Time: 02/24/20  3:31 PM   Specimen: Nasopharyngeal Swab  Result Value Ref Range Status   SARS Coronavirus 2 by RT PCR NEGATIVE NEGATIVE Final    Comment: (NOTE) SARS-CoV-2 target nucleic acids are NOT DETECTED. The SARS-CoV-2 RNA is generally detectable in upper respiratoy specimens during the acute phase of infection. The lowest concentration of SARS-CoV-2 viral copies this assay can detect is 131 copies/mL. A negative result does not preclude SARS-Cov-2 infection and should not be used as the sole basis for treatment or other patient management decisions. A negative result may occur with  improper specimen collection/handling, submission of specimen other than nasopharyngeal swab, presence of viral mutation(s) within the areas targeted by this assay, and inadequate  number of viral copies (<131 copies/mL). A negative result must be combined with clinical observations, patient history, and epidemiological information. The expected result is Negative. Fact Sheet for Patients:  https://www.moore.com/ Fact Sheet for Healthcare Providers:  https://www.young.biz/ This test is not yet ap proved or cleared by the [place] FDA and  has been authorized for detection and/or diagnosis of SARS-CoV-2 by FDA under an Emergency Use Authorization (EUA). This EUA will remain  in effect (meaning this test can be used) for the duration of the COVID-19 declaration under Section 564(b)(1) of the Act, 21  U.S.C. section 360bbb-3(b)(1), unless the authorization is terminated or revoked sooner.    Influenza A by PCR NEGATIVE NEGATIVE Final   Influenza B by PCR NEGATIVE NEGATIVE Final    Comment: (NOTE) The Xpert Xpress SARS-CoV-2/FLU/RSV assay is intended as an aid in  the diagnosis of influenza from Nasopharyngeal swab specimens and  should not be used as a sole basis for treatment. Nasal washings and  aspirates are unacceptable for Xpert Xpress SARS-CoV-2/FLU/RSV  testing. Fact Sheet for Patients: https://www.moore.com/ Fact Sheet for Healthcare Providers: https://www.young.biz/ This test is not yet approved or cleared by the [place] FDA and  has been authorized for detection and/or diagnosis of SARS-CoV-2 by  FDA under an Emergency Use Authorization (EUA). This EUA will remain  in effect (meaning this test can be used) for the duration of the  Covid-19 declaration under Section 564(b)(1) of the Act, 21  U.S.C. section 360bbb-3(b)(1), unless the authorization is  terminated or revoked. Performed at [facility], [address] [place], [place] [address]   C difficile quick scan w PCR reflex     Status: None   Collection Time: 02/24/20 11:06 PM   Specimen: STOOL  Result Value Ref Range Status   C Diff antigen NEGATIVE NEGATIVE Final   C Diff toxin NEGATIVE NEGATIVE Final   C Diff interpretation No C. difficile detected.  Final    Comment: Performed at [facility], [address] [place], [place] [address]         Radiology Studies: CT ANGIO CHEST PE W OR WO CONTRAST  Result Date: 02/24/2020 CLINICAL DATA:  Acute shortness of breath and weakness EXAM: CT ANGIOGRAPHY CHEST WITH CONTRAST TECHNIQUE: Multidetector CT imaging of the chest was performed using the standard protocol during bolus administration of intravenous contrast. Multiplanar CT image reconstructions and MIPs were obtained to evaluate the vascular  anatomy. CONTRAST:  11mL OMNIPAQUE IOHEXOL 350 MG/ML SOLN COMPARISON:  Chest x-ray from earlier in the same day. FINDINGS: Cardiovascular: Thoracic aorta demonstrates a normal branching pattern. No significant atherosclerotic calcifications are seen. No aneurysmal dilatation is noted. No cardiac enlargement is seen. The pulmonary artery shows a normal branching pattern. No filling defects to suggest pulmonary emboli are identified. Mediastinum/Nodes: Small sliding-type hiatal hernia is noted. No sizable hilar or mediastinal adenopathy is noted. The thoracic inlet appears within normal limits. Lungs/Pleura: Small pleural effusions are noted bilaterally. Minimal right basilar atelectasis is noted. No focal confluent infiltrate is seen. No sizable parenchymal nodules are noted. Mild changes of vascular congestion are seen. Upper Abdomen: Visualized upper abdomen is within normal limits. Musculoskeletal: No chest wall abnormality. No acute or significant osseous findings. Review of the MIP images confirms the above findings. IMPRESSION: No evidence of pulmonary emboli. Small bilateral pleural effusions and mild right basilar atelectasis. Mild changes of vascular congestion. Diffuse coronary calcifications. Electronically Signed   By: Alcide Clever M.D.   On: 02/24/2020 20:57  CARDIAC CATHETERIZATION  Result Date: 02/25/2020  Ost LAD to Prox LAD lesion is 100% stenosed.  Mid Cx lesion is 30% stenosed.  3rd Mrg lesion is 30% stenosed.  1.  Left dominant coronary arteries with chronically occluded ostial LAD with some right to left and left to left collaterals.  Mild disease affecting the left circumflex. 2.  Right heart catheterization showed severely elevated filling pressures with RA pressure of 10 mmHg, pulmonary capillary wedge pressure of 31 mmHg with prominent V wave suggestive of mitral regurgitation, moderate pulmonary hypertension at 55/31 mmHg and mildly reduced cardiac output at 4.56 with a cardiac index  of 2.47.  LVEDP was 34 mmHg. Recommendations: The patient likely had an LAD infarct 2 weeks ago.  I suspect that the anterior wall is now nonviable.  Unfortunately, she is left with severe ischemic cardiomyopathy and right heart catheterization shows evidence of severe volume overload likely responsible for her symptoms of shortness of breath and dry cough. I am going to start intravenous diuresis with furosemide 40 mg twice daily.  I added small dose carvedilol and losartan.  Recommend small dose spironolactone before hospital discharge and consider switching losartan to [facility] as an outpatient. Recommend aggressive medical therapy for coronary artery disease.   ECHOCARDIOGRAM COMPLETE  Result Date: 02/25/2020    ECHOCARDIOGRAM REPORT   Patient Name:   TRECHELLE BEACHER Date of Exam: 02/25/2020 Medical Rec #:  [id number]       Height:       65.0 in Accession #:    [id number]      Weight:       169.6 lb Date of Birth:  10/08/1956       BSA:          1.844 m Patient Age:    63 years        BP:           108/80 mmHg Patient Gender: F               HR:           98 bpm. Exam Location:  [facility] Procedure: 2D Echo, Cardiac Doppler, Color Doppler and Intracardiac            Opacification Agent Indications:     Chest pain 786.50  History:         Patient has no prior history of Echocardiogram examinations.                  Stroke.  Sonographer:     Cristela Blue RDCS (AE) Referring Phys:  [id number] Brien Few NIU Diagnosing Phys: Lorine Bears MD IMPRESSIONS  1. Left ventricular ejection fraction, by estimation, is 20 to 25%. The left ventricle has severely decreased function. The left ventricle demonstrates regional wall motion abnormalities (see scoring diagram/findings for description). The left ventricular internal cavity size was mildly dilated. Left ventricular diastolic parameters are consistent with Grade II diastolic dysfunction (pseudonormalization). There is akinesis of the left ventricular, mid-apical anteroseptal wall,  anterior segment  and apical segment. Prominent apical trabeculation with no clear apical thrombus with echo contrast.  2. Right ventricular systolic function is normal. The right ventricular size is normal. Tricuspid regurgitation signal is inadequate for assessing PA pressure.  3. Left atrial size was mildly dilated.  4. The mitral valve is normal in structure and function. Mild to moderate mitral valve regurgitation. No evidence of mitral stenosis.  5. The aortic valve is normal in structure and function. Aortic valve regurgitation is not  visualized. No aortic stenosis is present. FINDINGS  Left Ventricle: Left ventricular ejection fraction, by estimation, is 20 to 25%. The left ventricle has severely decreased function. The left ventricle demonstrates regional wall motion abnormalities. Definity contrast agent was given IV to delineate the left ventricular endocardial borders. The left ventricular internal cavity size was mildly dilated. There is no left ventricular hypertrophy. Left ventricular diastolic parameters are consistent with Grade II diastolic dysfunction (pseudonormalization). Right Ventricle: The right ventricular size is normal. No increase in right ventricular wall thickness. Right ventricular systolic function is normal. Tricuspid regurgitation signal is inadequate for assessing PA pressure. The tricuspid regurgitant velocity is 2.54 m/s, and with an assumed right atrial pressure of 10 mmHg, the estimated right ventricular systolic pressure is 35.8 mmHg. Left Atrium: Left atrial size was mildly dilated. Right Atrium: Right atrial size was normal in size. Pericardium: There is no evidence of pericardial effusion. Mitral Valve: The mitral valve is normal in structure and function. Normal mobility of the mitral valve leaflets. Mild to moderate mitral valve regurgitation. No evidence of mitral valve stenosis. Tricuspid Valve: The tricuspid valve is normal in structure. Tricuspid valve regurgitation  is trivial. No evidence of tricuspid stenosis. Aortic Valve: The aortic valve is normal in structure and function. Aortic valve regurgitation is not visualized. No aortic stenosis is present. Aortic valve mean gradient measures 2.0 mmHg. Aortic valve peak gradient measures 3.6 mmHg. Aortic valve area, by VTI measures 2.24 cm. Pulmonic Valve: The pulmonic valve was normal in structure. Pulmonic valve regurgitation is not visualized. No evidence of pulmonic stenosis. Aorta: The aortic root is normal in size and structure. Venous: The inferior vena cava was not well visualized. IAS/Shunts: No atrial level shunt detected by color flow Doppler.  LEFT VENTRICLE PLAX 2D LVIDd:         5.09 cm     Diastology LVIDs:         4.52 cm     LV e' lateral:   6.74 cm/s LV PW:         1.20 cm     LV E/e' lateral: 16.6 LV IVS:        0.84 cm     LV e' medial:    4.35 cm/s LVOT diam:     2.00 cm     LV E/e' medial:  25.7 LV SV:         42 LV SV Index:   23 LVOT Area:     3.14 cm  LV Volumes (MOD) LV vol d, MOD A2C: 98.5 ml LV vol d, MOD A4C: 99.0 ml LV vol s, MOD A2C: 82.4 ml LV vol s, MOD A4C: 79.4 ml LV SV MOD A2C:     16.1 ml LV SV MOD A4C:     99.0 ml LV SV MOD BP:      13.9 ml RIGHT VENTRICLE RV Basal diam:  2.80 cm RV S prime:     11.30 cm/s TAPSE (M-mode): 3.0 cm LEFT ATRIUM           Index LA diam:      3.20 cm 1.74 cm/m LA Vol (A2C): 51.1 ml 27.71 ml/m  AORTIC VALVE                   PULMONIC VALVE AV Area (Vmax):    1.99 cm    PV Vmax:        0.72 m/s AV Area (Vmean):   1.92 cm    PV Peak grad:  2.1 mmHg AV Area (VTI):     2.24 cm    RVOT Peak grad: 1 mmHg AV Vmax:           94.70 cm/s AV Vmean:          69.267 cm/s AV VTI:            0.190 m AV Peak Grad:      3.6 mmHg AV Mean Grad:      2.0 mmHg LVOT Vmax:         59.90 cm/s LVOT Vmean:        42.300 cm/s LVOT VTI:          0.135 m LVOT/AV VTI ratio: 0.71  AORTA Ao Root diam: 2.30 cm MITRAL VALVE                TRICUSPID VALVE MV Area (PHT): 5.75 cm     TR Peak  grad:   25.8 mmHg MV Decel Time: 132 msec     TR Vmax:        254.00 cm/s MV E velocity: 112.00 cm/s MV A velocity: 86.30 cm/s   SHUNTS MV E/A ratio:  1.30         Systemic VTI:  0.14 m                             Systemic Diam: 2.00 cm Lorine Bears MD Electronically signed by Lorine Bears MD Signature Date/Time: 02/25/2020/11:30:53 AM    Final         Scheduled Meds: . aspirin  81 mg Oral Daily  . dextromethorphan-guaiFENesin  1 tablet Oral BID  . enoxaparin (LOVENOX) injection  40 mg Subcutaneous Q24H  . furosemide  40 mg Intravenous Q12H  . gabapentin  300 mg Oral TID  . gabapentin  600 mg Oral TID  . losartan  12.5 mg Oral Daily  . metoprolol succinate  12.5 mg Oral Daily  . pantoprazole  40 mg Oral Daily  . rosuvastatin  40 mg Oral q1800  . sodium chloride flush  3 mL Intravenous Q12H   Continuous Infusions: . sodium chloride       LOS: 2 days    Time spent: 35 minutes    Pennie Banter, DO [facility]   If 7PM-7AM, please contact night-coverage [email or web address] 02/26/2020, 2:53 PM

## 2020-02-26 NOTE — Progress Notes (Signed)
Progress Note  Patient Name: Hannah Martinez Date of Encounter: 02/26/2020  Primary Cardiologist: Julien Nordmann, MD   Subjective   No reported chest pain. No shortness of breath.   No cough. No racing heart rate or palpitations.  Reports complete alleviation of GI symptoms.  States that she is feeling well today.  Reviewed the results of her echocardiogram and cardiac catheterization today.  Discussed her current medications and plan for follow-up.  Inpatient Medications    Scheduled Meds: . aspirin  81 mg Oral Daily  . carvedilol  3.125 mg Oral BID WC  . dextromethorphan-guaiFENesin  1 tablet Oral BID  . enoxaparin (LOVENOX) injection  40 mg Subcutaneous Q24H  . furosemide  40 mg Intravenous Q12H  . gabapentin  300 mg Oral TID  . gabapentin  600 mg Oral TID  . losartan  25 mg Oral Daily  . pantoprazole  40 mg Oral Daily  . rosuvastatin  40 mg Oral q1800  . sodium chloride flush  3 mL Intravenous Q12H   Continuous Infusions: . sodium chloride     PRN Meds: sodium chloride, acetaminophen, albuterol, fluticasone, HYDROcodone-acetaminophen, sodium chloride flush   Vital Signs    Vitals:   02/25/20 1845 02/25/20 1921 02/26/20 0352 02/26/20 0716  BP: 114/86 107/81 96/83 107/81  Pulse: (!) 102 (!) 102 97 98  Resp: 18   20  Temp: 97.8 F (36.6 C) 99.5 F (37.5 C) 98 F (36.7 C) (!) 97.5 F (36.4 C)  TempSrc:  Oral Oral Oral  SpO2: 100% 97% 100% 97%  Weight:      Height:        Intake/Output Summary (Last 24 hours) at 02/26/2020 0857 Last data filed at 02/26/2020 1610 Gross per 24 hour  Intake 240 ml  Output 3150 ml  Net -2910 ml   Last 3 Weights 02/25/2020 02/25/2020 02/24/2020  Weight (lbs) 169 lb 8.5 oz 169 lb 9.6 oz 160 lb  Weight (kg) 76.9 kg 76.93 kg 72.576 kg      Telemetry    Sinus tachycardia - Personally Reviewed  ECG    No new tracings - Personally Reviewed  Physical Exam   GEN: No acute distress.   Neck: No JVD Cardiac: regular and  tachycardic, no murmurs, rubs, or gallops.  Respiratory: Clear to auscultation bilaterally. GI: Soft, nontender, non-distended  MS: No edema; No deformity. Neuro:  Nonfocal  Psych: Normal affect   Labs    High Sensitivity Troponin:   Recent Labs  Lab 02/24/20 1345 02/24/20 1531 02/25/20 0727 02/25/20 1059 02/25/20 1318  TROPONINIHS 214* 203* 156* 161* 154*      Chemistry Recent Labs  Lab 02/24/20 1345 02/25/20 0352 02/26/20 0822  NA 140 139 140  K 3.4* 4.1 3.6  CL 111 111 104  CO2 20* 19* 23  GLUCOSE 109* 112* 111*  BUN 8 9 9   CREATININE 0.87 0.81 1.08*  CALCIUM 8.7* 8.4* 9.3  GFRNONAA >60 >60 55*  GFRAA >60 >60 >60  ANIONGAP 9 9 13      Hematology Recent Labs  Lab 02/24/20 1345 02/25/20 0352 02/26/20 0628  WBC 7.4 7.3 5.6  RBC 3.90 3.45* 4.36  HGB 12.7 11.2* 14.1  HCT 38.9 34.3* 43.9  MCV 99.7 99.4 100.7*  MCH 32.6 32.5 32.3  MCHC 32.6 32.7 32.1  RDW 12.5 12.7 12.7  PLT 354 348 359    BNPNo results for input(s): BNP, PROBNP in the last 168 hours.   DDimer No results for input(s): DDIMER in  the last 168 hours.   Radiology    DG Chest 2 View  Result Date: 02/24/2020 CLINICAL DATA:  Wheezing, headache, cough, shortness of breath EXAM: CHEST - 2 VIEW COMPARISON:  11/22/2019 FINDINGS: There is mild bilateral interstitial thickening. The lungs are mildly hyperinflated. There is no focal consolidation. There is no pleural effusion or pneumothorax. The cardiomediastinal silhouette is stable. There is no acute osseous abnormality. IMPRESSION: No acute cardiopulmonary disease. Electronically Signed   By: Kathreen Devoid   On: 02/24/2020 13:57   CT ANGIO CHEST PE W OR WO CONTRAST  Result Date: 02/24/2020 CLINICAL DATA:  Acute shortness of breath and weakness EXAM: CT ANGIOGRAPHY CHEST WITH CONTRAST TECHNIQUE: Multidetector CT imaging of the chest was performed using the standard protocol during bolus administration of intravenous contrast. Multiplanar CT image  reconstructions and MIPs were obtained to evaluate the vascular anatomy. CONTRAST:  49mL OMNIPAQUE IOHEXOL 350 MG/ML SOLN COMPARISON:  Chest x-ray from earlier in the same day. FINDINGS: Cardiovascular: Thoracic aorta demonstrates a normal branching pattern. No significant atherosclerotic calcifications are seen. No aneurysmal dilatation is noted. No cardiac enlargement is seen. The pulmonary artery shows a normal branching pattern. No filling defects to suggest pulmonary emboli are identified. Mediastinum/Nodes: Small sliding-type hiatal hernia is noted. No sizable hilar or mediastinal adenopathy is noted. The thoracic inlet appears within normal limits. Lungs/Pleura: Small pleural effusions are noted bilaterally. Minimal right basilar atelectasis is noted. No focal confluent infiltrate is seen. No sizable parenchymal nodules are noted. Mild changes of vascular congestion are seen. Upper Abdomen: Visualized upper abdomen is within normal limits. Musculoskeletal: No chest wall abnormality. No acute or significant osseous findings. Review of the MIP images confirms the above findings. IMPRESSION: No evidence of pulmonary emboli. Small bilateral pleural effusions and mild right basilar atelectasis. Mild changes of vascular congestion. Diffuse coronary calcifications. Electronically Signed   By: Inez Catalina M.D.   On: 02/24/2020 20:57   CARDIAC CATHETERIZATION  Result Date: 02/25/2020  Ost LAD to Prox LAD lesion is 100% stenosed.  Mid Cx lesion is 30% stenosed.  3rd Mrg lesion is 30% stenosed.  1.  Left dominant coronary arteries with chronically occluded ostial LAD with some right to left and left to left collaterals.  Mild disease affecting the left circumflex. 2.  Right heart catheterization showed severely elevated filling pressures with RA pressure of 10 mmHg, pulmonary capillary wedge pressure of 31 mmHg with prominent V wave suggestive of mitral regurgitation, moderate pulmonary hypertension at 55/31 mmHg  and mildly reduced cardiac output at 4.56 with a cardiac index of 2.47.  LVEDP was 34 mmHg. Recommendations: The patient likely had an LAD infarct 2 weeks ago.  I suspect that the anterior wall is now nonviable.  Unfortunately, she is left with severe ischemic cardiomyopathy and right heart catheterization shows evidence of severe volume overload likely responsible for her symptoms of shortness of breath and dry cough. I am going to start intravenous diuresis with furosemide 40 mg twice daily.  I added small dose carvedilol and losartan.  Recommend small dose spironolactone before hospital discharge and consider switching losartan to [facility] as an outpatient. Recommend aggressive medical therapy for coronary artery disease.   ECHOCARDIOGRAM COMPLETE  Result Date: 02/25/2020    ECHOCARDIOGRAM REPORT   Patient Name:   SHAJUAN MUSSO Date of Exam: 02/25/2020 Medical Rec #:  [id number]       Height:       65.0 in Accession #:    [id number]  Weight:       169.6 lb Date of Birth:  [birth year]       BSA:          1.844 m Patient Age:    63 years        BP:           108/80 mmHg Patient Gender: F               HR:           98 bpm. Exam Location:  [facility] Procedure: 2D Echo, Cardiac Doppler, Color Doppler and Intracardiac            Opacification Agent Indications:     Chest pain 786.50  History:         Patient has no prior history of Echocardiogram examinations.                  Stroke.  Sonographer:     Cristela Blue RDCS (AE) Referring Phys:  [id number] Brien Few NIU Diagnosing Phys: Lorine Bears MD IMPRESSIONS  1. Left ventricular ejection fraction, by estimation, is 20 to 25%. The left ventricle has severely decreased function. The left ventricle demonstrates regional wall motion abnormalities (see scoring diagram/findings for description). The left ventricular internal cavity size was mildly dilated. Left ventricular diastolic parameters are consistent with Grade II diastolic dysfunction (pseudonormalization). There is  akinesis of the left ventricular, mid-apical anteroseptal wall, anterior segment  and apical segment. Prominent apical trabeculation with no clear apical thrombus with echo contrast.  2. Right ventricular systolic function is normal. The right ventricular size is normal. Tricuspid regurgitation signal is inadequate for assessing PA pressure.  3. Left atrial size was mildly dilated.  4. The mitral valve is normal in structure and function. Mild to moderate mitral valve regurgitation. No evidence of mitral stenosis.  5. The aortic valve is normal in structure and function. Aortic valve regurgitation is not visualized. No aortic stenosis is present. FINDINGS  Left Ventricle: Left ventricular ejection fraction, by estimation, is 20 to 25%. The left ventricle has severely decreased function. The left ventricle demonstrates regional wall motion abnormalities. Definity contrast agent was given IV to delineate the left ventricular endocardial borders. The left ventricular internal cavity size was mildly dilated. There is no left ventricular hypertrophy. Left ventricular diastolic parameters are consistent with Grade II diastolic dysfunction (pseudonormalization). Right Ventricle: The right ventricular size is normal. No increase in right ventricular wall thickness. Right ventricular systolic function is normal. Tricuspid regurgitation signal is inadequate for assessing PA pressure. The tricuspid regurgitant velocity is 2.54 m/s, and with an assumed right atrial pressure of 10 mmHg, the estimated right ventricular systolic pressure is 35.8 mmHg. Left Atrium: Left atrial size was mildly dilated. Right Atrium: Right atrial size was normal in size. Pericardium: There is no evidence of pericardial effusion. Mitral Valve: The mitral valve is normal in structure and function. Normal mobility of the mitral valve leaflets. Mild to moderate mitral valve regurgitation. No evidence of mitral valve stenosis. Tricuspid Valve: The  tricuspid valve is normal in structure. Tricuspid valve regurgitation is trivial. No evidence of tricuspid stenosis. Aortic Valve: The aortic valve is normal in structure and function. Aortic valve regurgitation is not visualized. No aortic stenosis is present. Aortic valve mean gradient measures 2.0 mmHg. Aortic valve peak gradient measures 3.6 mmHg. Aortic valve area, by VTI measures 2.24 cm. Pulmonic Valve: The pulmonic valve was normal in structure. Pulmonic valve regurgitation is not visualized. No evidence of pulmonic  stenosis. Aorta: The aortic root is normal in size and structure. Venous: The inferior vena cava was not well visualized. IAS/Shunts: No atrial level shunt detected by color flow Doppler.  LEFT VENTRICLE PLAX 2D LVIDd:         5.09 cm     Diastology LVIDs:         4.52 cm     LV e' lateral:   6.74 cm/s LV PW:         1.20 cm     LV E/e' lateral: 16.6 LV IVS:        0.84 cm     LV e' medial:    4.35 cm/s LVOT diam:     2.00 cm     LV E/e' medial:  25.7 LV SV:         42 LV SV Index:   23 LVOT Area:     3.14 cm  LV Volumes (MOD) LV vol d, MOD A2C: 98.5 ml LV vol d, MOD A4C: 99.0 ml LV vol s, MOD A2C: 82.4 ml LV vol s, MOD A4C: 79.4 ml LV SV MOD A2C:     16.1 ml LV SV MOD A4C:     99.0 ml LV SV MOD BP:      13.9 ml RIGHT VENTRICLE RV Basal diam:  2.80 cm RV S prime:     11.30 cm/s TAPSE (M-mode): 3.0 cm LEFT ATRIUM           Index LA diam:      3.20 cm 1.74 cm/m LA Vol (A2C): 51.1 ml 27.71 ml/m  AORTIC VALVE                   PULMONIC VALVE AV Area (Vmax):    1.99 cm    PV Vmax:        0.72 m/s AV Area (Vmean):   1.92 cm    PV Peak grad:   2.1 mmHg AV Area (VTI):     2.24 cm    RVOT Peak grad: 1 mmHg AV Vmax:           94.70 cm/s AV Vmean:          69.267 cm/s AV VTI:            0.190 m AV Peak Grad:      3.6 mmHg AV Mean Grad:      2.0 mmHg LVOT Vmax:         59.90 cm/s LVOT Vmean:        42.300 cm/s LVOT VTI:          0.135 m LVOT/AV VTI ratio: 0.71  AORTA Ao Root diam: 2.30 cm MITRAL VALVE                 TRICUSPID VALVE MV Area (PHT): 5.75 cm     TR Peak grad:   25.8 mmHg MV Decel Time: 132 msec     TR Vmax:        254.00 cm/s MV E velocity: 112.00 cm/s MV A velocity: 86.30 cm/s   SHUNTS MV E/A ratio:  1.30         Systemic VTI:  0.14 m                             Systemic Diam: 2.00 cm Lorine BearsMuhammad Arida MD Electronically signed by Lorine BearsMuhammad Arida MD Signature Date/Time: 02/25/2020/11:30:53 AM    Final  Cardiac Studies   [facility] 02/25/2020  Ost LAD to Prox LAD lesion is 100% stenosed.  Mid Cx lesion is 30% stenosed.  3rd Mrg lesion is 30% stenosed. 1.  Left dominant coronary arteries with chronically occluded ostial LAD with some right to left and left to left collaterals.  Mild disease affecting the left circumflex. 2.  Right heart catheterization showed severely elevated filling pressures with RA pressure of 10 mmHg, pulmonary capillary wedge pressure of 31 mmHg with prominent V wave suggestive of mitral regurgitation, moderate pulmonary hypertension at 55/31 mmHg and mildly reduced cardiac output at 4.56 with a cardiac index of 2.47.  LVEDP was 34 mmHg. Recommendations: The patient likely had an LAD infarct 2 weeks ago.  I suspect that the anterior wall is now nonviable.  Unfortunately, she is left with severe ischemic cardiomyopathy and right heart catheterization shows evidence of severe volume overload likely responsible for her symptoms of shortness of breath and dry cough. I am going to start intravenous diuresis with furosemide 40 mg twice daily.  I added small dose carvedilol and losartan.  Recommend small dose spironolactone before hospital discharge and consider switching losartan to [facility] as an outpatient. Recommend aggressive medical therapy for coronary artery disease.   Echo 02/25/2020 1. Left ventricular ejection fraction, by estimation, is 20 to 25%. The  left ventricle has severely decreased function. The left ventricle  demonstrates regional wall motion  abnormalities (see scoring  diagram/findings for description). The left  ventricular internal cavity size was mildly dilated. Left ventricular  diastolic parameters are consistent with Grade II diastolic dysfunction  (pseudonormalization). There is akinesis of the left ventricular,  mid-apical anteroseptal wall, anterior segment  and apical segment. Prominent apical trabeculation with no clear apical  thrombus with echo contrast.  2. Right ventricular systolic function is normal. The right ventricular  size is normal. Tricuspid regurgitation signal is inadequate for assessing  PA pressure.  3. Left atrial size was mildly dilated.  4. The mitral valve is normal in structure and function. Mild to moderate  mitral valve regurgitation. No evidence of mitral stenosis.  5. The aortic valve is normal in structure and function. Aortic valve  regurgitation is not visualized. No aortic stenosis is present.   Patient Profile     64 y.o. female with a history of late presenting anterior ST elevation myocardial infarction s/p left heart cardiac catheterization as directed with mild, HFrEF with newly reduced ejection fraction 20 to 25%, stroke, cerebral aneurysm s/p clipping, chronic pain, hyperlipidemia  Assessment & Plan    Late presenting anterior ST elevation myocardial infarction --No chest pain.  Reports significant improvement in her symptoms from yesterday and after her cardiac catheterization.  As above.  It was suspected she had an LAD infarct 2 weeks ago anterior wall nonviable.  High-sensitivity troponin peaked at 161 and now downtrending.  Echo showed EF 20 to 25%.  L/RHC showed evidence of severe volume overload and elevated pulmonary pressures with LVEDP severe ischemic cardiomyopathy.  She was started on IV diuresis shows improvement in volume status today on exam.   --Continue current medical management as pressures allow.  Following diuresis yesterday, pressures have been  soft today and with overnight bump in renal function. Given her volume status on exam, bump and renal function, and tachycardia, recommend transition to oral diuresis at this time. Continue ASA with protonix. Continue low dose BB for rate control given tachycardic rates today. Losartan should be continued as pressures allow but with consideration of  overnight bump in renal function. Pressures preclude transition from losartan to Entresto at this time and can be done as an outpatient. Recommend close monitoring of renal function. Also recommend continued statin for risk factor modification and with follow-up lipid and liver in 6-8 weeks.   HFrEF (EF 20-25%) Pulmonary HTN --Reports improved breathing status and no further coughing.  Echo as above with EF 20-25% and s/p anterior STEMI with cath showing elevated pulmonary pressures. Net -3L for the admission and -2.5 L yesterday with improved volume status on exam. As above, given improved volume status and overnight renal function, recommend transition to oral diuresis. Low dose BB for elevated rates as pressures allow. Continue Losartan as pressures and renal function allow and transition to [facility] with addition of spironolactone in the outpatient setting. Recommend follow-up echo to reassess EF in 3-6 months.     For questions or updates, please contact [facility] Please consult [email or web address] for contact info under        Signed, Lennon Alstrom, PA-C  02/26/2020, 8:57 AM

## 2020-02-27 DIAGNOSIS — I2510 Atherosclerotic heart disease of native coronary artery without angina pectoris: Secondary | ICD-10-CM | POA: Diagnosis present

## 2020-02-27 DIAGNOSIS — I255 Ischemic cardiomyopathy: Secondary | ICD-10-CM

## 2020-02-27 DIAGNOSIS — E7800 Pure hypercholesterolemia, unspecified: Secondary | ICD-10-CM

## 2020-02-27 LAB — BASIC METABOLIC PANEL
Anion gap: 14 (ref 5–15)
BUN: 17 mg/dL (ref 8–23)
CO2: 22 mmol/L (ref 22–32)
Calcium: 9.4 mg/dL (ref 8.9–10.3)
Chloride: 104 mmol/L (ref 98–111)
Creatinine, Ser: 1.25 mg/dL — ABNORMAL HIGH (ref 0.44–1.00)
GFR calc Af Amer: 53 mL/min — ABNORMAL LOW (ref >60)
GFR calc non Af Amer: 46 mL/min — ABNORMAL LOW (ref >60)
Glucose, Bld: 101 mg/dL — ABNORMAL HIGH (ref 70–99)
Potassium: 3.6 mmol/L (ref 3.5–5.1)
Sodium: 140 mmol/L (ref 135–145)

## 2020-02-27 LAB — CBC
HCT: 45.4 % (ref 36.0–46.0)
Hemoglobin: 14.5 g/dL (ref 12.0–15.0)
MCH: 32.1 pg (ref 26.0–34.0)
MCHC: 31.9 g/dL (ref 30.0–36.0)
MCV: 100.4 fL — ABNORMAL HIGH (ref 80.0–100.0)
Platelets: 375 10*3/uL (ref 150–400)
RBC: 4.52 MIL/uL (ref 3.87–5.11)
RDW: 12.6 % (ref 11.5–15.5)
WBC: 5.5 10*3/uL (ref 4.0–10.5)
nRBC: 0 % (ref 0.0–0.2)

## 2020-02-27 LAB — MAGNESIUM: Magnesium: 2.4 mg/dL (ref 1.7–2.4)

## 2020-02-27 MED ORDER — SIMVASTATIN 20 MG PO TABS: 40.0000 mg | ORAL_TABLET | Freq: Every day | ORAL | 1 refills | Status: DC

## 2020-02-27 MED ORDER — SPIRONOLACTONE 25 MG PO TABS
12.5000 mg | ORAL_TABLET | Freq: Every day | ORAL | Status: DC
  Filled 2020-02-27: qty 0.5

## 2020-02-27 MED ORDER — SPIRONOLACTONE 25 MG PO TABS: 12.5000 mg | ORAL_TABLET | Freq: Every day | ORAL | 0 refills | Status: DC

## 2020-02-27 MED ORDER — FUROSEMIDE 40 MG PO TABS: 40.0000 mg | ORAL_TABLET | Freq: Two times a day (BID) | ORAL | Status: DC

## 2020-02-27 MED ORDER — ASPIRIN 81 MG PO CHEW: 81.0000 mg | CHEWABLE_TABLET | Freq: Every day | ORAL | 1 refills | Status: DC

## 2020-02-27 MED ORDER — METOPROLOL SUCCINATE ER 25 MG PO TB24: 25.0000 mg | ORAL_TABLET | Freq: Every day | ORAL | 1 refills | Status: DC

## 2020-02-27 MED ORDER — LOSARTAN POTASSIUM 25 MG PO TABS: 12.5000 mg | ORAL_TABLET | Freq: Every day | ORAL | 1 refills | Status: DC

## 2020-02-27 MED ORDER — FUROSEMIDE 40 MG PO TABS: 40.0000 mg | ORAL_TABLET | Freq: Two times a day (BID) | ORAL | 0 refills | Status: DC

## 2020-02-27 MED ORDER — METOPROLOL SUCCINATE ER 25 MG PO TB24
25.0000 mg | ORAL_TABLET | Freq: Every day | ORAL | Status: DC
  Administered 2020-02-27: 12:00:00 25 mg via ORAL
  Filled 2020-02-27: qty 1

## 2020-02-27 NOTE — Progress Notes (Addendum)
Progress Note  Patient Name: Hannah Martinez Date of Encounter: 02/27/2020  Primary Cardiologist: Julien Nordmann, MD   Subjective   Patient states feeling well.  Denies chest pain or shortness of breath.  Wheezing with laying on her back has improved.  She would like to go home soon.  Her dry weight is around 160 pounds.  Inpatient Medications    Scheduled Meds:  aspirin  81 mg Oral Daily   dextromethorphan-guaiFENesin  1 tablet Oral BID   enoxaparin (LOVENOX) injection  40 mg Subcutaneous Q24H   furosemide  40 mg Intravenous Q12H   gabapentin  300 mg Oral TID   gabapentin  600 mg Oral TID   losartan  12.5 mg Oral Daily   metoprolol succinate  25 mg Oral Daily   pantoprazole  40 mg Oral Daily   rosuvastatin  40 mg Oral q1800   sodium chloride flush  3 mL Intravenous Q12H   Continuous Infusions:  sodium chloride     PRN Meds: sodium chloride, acetaminophen, albuterol, fluticasone, HYDROcodone-acetaminophen, sodium chloride flush   Vital Signs    Vitals:   02/26/20 2036 02/27/20 0103 02/27/20 0329 02/27/20 0819  BP: (!) 89/75 94/65 118/61 140/85  Pulse: 96 99 95 92  Resp:    16  Temp:   98.6 F (37 C) 97.8 F (36.6 C)  TempSrc:   Oral Oral  SpO2:  96% 98% 99%  Weight:   73.8 kg   Height:        Intake/Output Summary (Last 24 hours) at 02/27/2020 1201 Last data filed at 02/27/2020 0448 Gross per 24 hour  Intake --  Output 200 ml  Net -200 ml   Last 3 Weights 02/27/2020 02/25/2020 02/25/2020  Weight (lbs) 162 lb 9.6 oz 169 lb 8.5 oz 169 lb 9.6 oz  Weight (kg) 73.755 kg 76.9 kg 76.93 kg      Telemetry    Sinus tachycardia, heart rate 110.- Personally Reviewed  ECG    No new tracing obtained  Physical Exam   GEN: No acute distress.   Neck:  Not able to assess Cardiac: RRR, no murmurs, rubs, or gallops.  Respiratory: Clear to auscultation anteriorly. GI: Soft, nontender, non-distended  MS: No edema; No deformity. Neuro:  Nonfocal  Psych:  Normal affect   Labs    High Sensitivity Troponin:   Recent Labs  Lab 02/24/20 1345 02/24/20 1531 02/25/20 0727 02/25/20 1059 02/25/20 1318  TROPONINIHS 214* 203* 156* 161* 154*      Chemistry Recent Labs  Lab 02/25/20 0352 02/26/20 0822 02/27/20 0613  NA 139 140 140  K 4.1 3.6 3.6  CL 111 104 104  CO2 19* 23 22  GLUCOSE 112* 111* 101*  BUN 9 9 17   CREATININE 0.81 1.08* 1.25*  CALCIUM 8.4* 9.3 9.4  GFRNONAA >60 55* 46*  GFRAA >60 >60 53*  ANIONGAP 9 13 14      Hematology Recent Labs  Lab 02/25/20 0352 02/26/20 0628 02/27/20 0613  WBC 7.3 5.6 5.5  RBC 3.45* 4.36 4.52  HGB 11.2* 14.1 14.5  HCT 34.3* 43.9 45.4  MCV 99.4 100.7* 100.4*  MCH 32.5 32.3 32.1  MCHC 32.7 32.1 31.9  RDW 12.7 12.7 12.6  PLT 348 359 375    BNPNo results for input(s): BNP, PROBNP in the last 168 hours.   DDimer No results for input(s): DDIMER in the last 168 hours.   Radiology    CARDIAC CATHETERIZATION  Result Date: 02/25/2020  Ost LAD to Prox  LAD lesion is 100% stenosed.  Mid Cx lesion is 30% stenosed.  3rd Mrg lesion is 30% stenosed.  1.  Left dominant coronary arteries with chronically occluded ostial LAD with some right to left and left to left collaterals.  Mild disease affecting the left circumflex. 2.  Right heart catheterization showed severely elevated filling pressures with RA pressure of 10 mmHg, pulmonary capillary wedge pressure of 31 mmHg with prominent V wave suggestive of mitral regurgitation, moderate pulmonary hypertension at 55/31 mmHg and mildly reduced cardiac output at 4.56 with a cardiac index of 2.47.  LVEDP was 34 mmHg. Recommendations: The patient likely had an LAD infarct 2 weeks ago.  I suspect that the anterior wall is now nonviable.  Unfortunately, she is left with severe ischemic cardiomyopathy and right heart catheterization shows evidence of severe volume overload likely responsible for her symptoms of shortness of breath and dry cough. I am going to  start intravenous diuresis with furosemide 40 mg twice daily.  I added small dose carvedilol and losartan.  Recommend small dose spironolactone before hospital discharge and consider switching losartan to [facility] as an outpatient. Recommend aggressive medical therapy for coronary artery disease.    Cardiac Studies   [facility] 02/25/2020  Ost LAD to Prox LAD lesion is 100% stenosed.  Mid Cx lesion is 30% stenosed.  3rd Mrg lesion is 30% stenosed. 1. Left dominant coronary arteries with chronically occluded ostial LAD with some right to left and left to left collaterals. Mild disease affecting the left circumflex. 2. Right heart catheterization showed severely elevated filling pressures with RA pressure of 10 mmHg, pulmonary capillary wedge pressure of 31 mmHg with prominent V wave suggestive of mitral regurgitation, moderate pulmonary hypertension at 55/31 mmHg and mildly reduced cardiac output at 4.56 with a cardiac index of 2.47. LVEDP was 34 mmHg. Recommendations: The patient likely had an LAD infarct 2 weeks ago. I suspect that the anterior wall is now nonviable. Unfortunately, she is left with severe ischemic cardiomyopathy and right heart catheterization shows evidence of severe volume overload likely responsible for her symptoms of shortness of breath and dry cough. I am going to start intravenous diuresis with furosemide 40 mg twice daily. I added small dose carvedilol and losartan. Recommend small dose spironolactone before hospital discharge and consider switching losartan to [facility] as an outpatient. Recommend aggressive medical therapy for coronary artery disease.   Echo 02/25/2020 1. Left ventricular ejection fraction, by estimation, is 20 to 25%. The  left ventricle has severely decreased function. The left ventricle  demonstrates regional wall motion abnormalities (see scoring  diagram/findings for description). The left  ventricular internal cavity size was mildly  dilated. Left ventricular  diastolic parameters are consistent with Grade II diastolic dysfunction  (pseudonormalization). There is akinesis of the left ventricular,  mid-apical anteroseptal wall, anterior segment  and apical segment. Prominent apical trabeculation with no clear apical  thrombus with echo contrast.  2. Right ventricular systolic function is normal. The right ventricular  size is normal. Tricuspid regurgitation signal is inadequate for assessing  PA pressure.  3. Left atrial size was mildly dilated.  4. The mitral valve is normal in structure and function. Mild to moderate  mitral valve regurgitation. No evidence of mitral stenosis.  5. The aortic valve is normal in structure and function. Aortic valve  regurgitation is not visualized. No aortic stenosis is present.   Patient Profile     64 y.o. female CVA, cerebral aneurysm status post clip, hyperlipidemia presenting with cough  and wheezing.  Subsequently underwent an echocardiogram showing severely reduced ejection fraction with EF 20 to 25%.  Left heart cath showed chronic occluded LAD with right to left and left to left collaterals.  Assessment & Plan    1. HFrEF EF 20-25% -She appears euvolemic, currently denies symptoms. -Heart rate is elevated, currently 110 bpm, increased Toprol-XL to 25 mg daily. -Continue losartan 12.5 mg daily -Escalation of heart failure meds as blood pressure permits. -start low dose aldactone 12.5mg  qd -Start p.o. Lasix 40 mg twice daily.  Dispo planning  2.  CAD -Chronically occluded LAD with right to left, and left to left collaterals -Wall motion abnormalities/akinesis in the LAD territory demonstrated on echo -Consider viability study as outpatient with cardiac MRI. -Continue aspirin, Crestor  3.  Hyperlipidemia -Continue Crestor.      Signed, Kate Sable, MD  02/27/2020, 12:01 PM

## 2020-02-27 NOTE — Discharge Summary (Addendum)
Physician Discharge Summary  Hannah Martinez GGY:[id number] DOB: [birth year] DOA: 02/24/2020  PCP: [facility]  Admit date: 02/24/2020 Discharge date: 02/27/2020  Admitted From: Home Disposition: Home  Recommendations for Outpatient Follow-up:  1. Follow up with PCP in 1-2 weeks 2. Follow-up with cardiology (Dr. Mariah Milling) in 3 weeks  Home Health: None (established with cardiac rehab program) Equipment/Devices: None  Discharge Condition: Fair CODE STATUS: Full code Diet recommendation: Heart Healthy    Discharge Diagnoses:  Principal Problem:   Acute systolic heart failure (HCC)  Active Problems: Ischemic cardiomyopathy, acute   NSTEMI (non-ST elevated myocardial infarction) (HCC)   Elevated troponin   Stroke (HCC)   GERD (gastroesophageal reflux disease)   Hypokalemia   Nausea vomiting and diarrhea   Coronary artery disease involving native coronary artery of native heart without angina pectoris   Pure hypercholesterolemia  Brief narrative/HPI 64 year old female with history of stroke, hyperlipidemia, GERD, cerebral aneurysm status post clipping presented with increasing shortness of breath and chest pain for several days which was worse on the day of presentation.  Reported having URI with frequent coughing spells recently.  In the ED vitals were stable but was found to have markedly elevated troponin of 214 and potassium of 3.4.  Chest x-ray unremarkable.  2D echo done showed markedly low EF of 20-25% with grade 2 diastolic dysfunction and significant wall motion abnormality.  Admitted with cardiology evaluation and left heart catheterization.  Hospital course   Principal problem Acute systolic CHF/ischemic cardiomyopathy NSTEMI 2D echo with EF of 20-25% with grade 2 diastolic dysfunction and wall motion abnormality.  CT angiogram of the chest negative for PE.  No further chest pain symptoms and dyspnea markedly improved. Left and right heart  catheterization done showing OST LAD to proximal LAD lesion 100% stenosed, mid circumflex lesion 30% stenosed and third marginal lesion 30% stenosed.  Also noted for moderate pulmonary hypertension. Suspect late presentation of NSTEMI and had an LAD infarct about 2 weeks back.  Anterior wall appeared nonviable.  Diuresed with IV Lasix twice daily.  Added low-dose Coreg (dose increased today) and losartan.  Plan is to switch losartan to [facility] as outpatient.  Her blood pressure is soft but asymptomatic. Marland Kitchen Patient will be discharged on p.o. Lasix 40 mg twice daily, Coreg, losartan and aspirin.  Also added low-dose Aldactone per cardiology.  Increased Zocor dose to 40 mg daily (on 20 mg at home).  LDL of 130s.   Heart failure Home health instructions provided.  Cardiology recommends a viability study as outpatient with cardiac MRI.  Active problems  PVCs Toprol dose increased.  Electrolytes (K and Mg) stable.  History of stroke Continue statin (Zocor dose increased as LDL not at goal).  Added baby aspirin.  Nausea vomiting diarrhea Positive to recent viral illness.  Resolved.  hypokalemia Replenished.   Disposition: Home Family communication: None Consults: Cardiology Procedures: 2D echo, left heart catheterization   Discharge Instructions  Discharge Instructions    Call MD for:  extreme fatigue   Complete by: As directed    Call MD for:  persistant dizziness or light-headedness   Complete by: As directed    Call MD for:  severe uncontrolled pain   Complete by: As directed    Call MD for:  temperature >100.4   Complete by: As directed    Diet - low sodium heart healthy   Complete by: As directed    Increase activity slowly   Complete by: As directed  Allergies as of 02/27/2020      Reactions   Lipitor [atorvastatin] Hives      Medication List    TAKE these medications   aspirin 81 MG chewable tablet Chew 1 tablet (81 mg total) by mouth daily. Start taking  on: February 28, 2020   benzonatate 100 MG capsule Commonly known as: Lawyer Take 1 capsule (100 mg total) by mouth every 6 (six) hours as needed for cough.   dextromethorphan-guaiFENesin 30-600 MG 12hr tablet Commonly known as: MUCINEX DM Take 1 tablet by mouth 2 (two) times daily for 14 days.   fluticasone 50 MCG/ACT nasal spray Commonly known as: FLONASE Place 1-2 sprays into both nostrils daily as needed for allergies.   furosemide 40 MG tablet Commonly known as: LASIX Take 1 tablet (40 mg total) by mouth 2 (two) times daily.   gabapentin 600 MG tablet Commonly known as: NEURONTIN Take 600 mg by mouth 3 (three) times daily. (take each tablet with 300mg  capsule to equal 900mg  three times a day)   gabapentin 300 MG capsule Commonly known as: NEURONTIN Take 300 mg by mouth 3 (three) times daily. (take each capsule with 600mg  tablet to equal 900mg  three times a day)   HYDROcodone-acetaminophen 5-325 MG tablet Commonly known as: NORCO/VICODIN Take 1 tablet by mouth every 6 (six) hours as needed for moderate pain.   losartan 25 MG tablet Commonly known as: COZAAR Take 0.5 tablets (12.5 mg total) by mouth daily. Start taking on: February 28, 2020   meloxicam 7.5 MG tablet Commonly known as: MOBIC Take 7.5 mg by mouth daily.   metoprolol succinate 25 MG 24 hr tablet Commonly known as: TOPROL-XL Take 1 tablet (25 mg total) by mouth daily. Start taking on: February 28, 2020   omeprazole 20 MG capsule Commonly known as: PRILOSEC Take 20 mg by mouth daily.   simvastatin 20 MG tablet Commonly known as: ZOCOR Take 2 tablets (40 mg total) by mouth at bedtime. What changed: how much to take      Follow-up Information    Gollan, , MD. Schedule an appointment as soon as possible for a visit in 3 week(s).   Specialty: Cardiology Contact information: [address] [place] March 01, 2020 Tollie Pizza [phone]        [facility] [place].  Schedule an appointment as soon as possible for a visit in 1 week(s).   Specialty: General Practice Contact information: [address] [place] [facility] [employer] [phone]          Allergies  Allergen Reactions  . Lipitor [Atorvastatin] Hives       Procedures/Studies: DG Chest 2 View  Result Date: 02/24/2020 CLINICAL DATA:  Wheezing, headache, cough, shortness of breath EXAM: CHEST - 2 VIEW COMPARISON:  11/22/2019 FINDINGS: There is mild bilateral interstitial thickening. The lungs are mildly hyperinflated. There is no focal consolidation. There is no pleural effusion or pneumothorax. The cardiomediastinal silhouette is stable. There is no acute osseous abnormality. IMPRESSION: No acute cardiopulmonary disease. Electronically Signed   By: [id number]   On: 02/24/2020 13:57   CT ANGIO CHEST PE W OR WO CONTRAST  Result Date: 02/24/2020 CLINICAL DATA:  Acute shortness of breath and weakness EXAM: CT ANGIOGRAPHY CHEST WITH CONTRAST TECHNIQUE: Multidetector CT imaging of the chest was performed using the standard protocol during bolus administration of intravenous contrast. Multiplanar CT image reconstructions and MIPs were obtained to evaluate the vascular anatomy. CONTRAST:  69mL OMNIPAQUE IOHEXOL 350  MG/ML SOLN COMPARISON:  Chest x-ray from earlier in the same day. FINDINGS: Cardiovascular: Thoracic aorta demonstrates a normal branching pattern. No significant atherosclerotic calcifications are seen. No aneurysmal dilatation is noted. No cardiac enlargement is seen. The pulmonary artery shows a normal branching pattern. No filling defects to suggest pulmonary emboli are identified. Mediastinum/Nodes: Small sliding-type hiatal hernia is noted. No sizable hilar or mediastinal adenopathy is noted. The thoracic inlet appears within normal limits. Lungs/Pleura: Small pleural effusions are noted bilaterally. Minimal right basilar atelectasis is noted. No focal confluent infiltrate is  seen. No sizable parenchymal nodules are noted. Mild changes of vascular congestion are seen. Upper Abdomen: Visualized upper abdomen is within normal limits. Musculoskeletal: No chest wall abnormality. No acute or significant osseous findings. Review of the MIP images confirms the above findings. IMPRESSION: No evidence of pulmonary emboli. Small bilateral pleural effusions and mild right basilar atelectasis. Mild changes of vascular congestion. Diffuse coronary calcifications. Electronically Signed   By: Alcide CleverMark  Lukens M.D.   On: 02/24/2020 20:57   CARDIAC CATHETERIZATION  Result Date: 02/25/2020  Ost LAD to Prox LAD lesion is 100% stenosed.  Mid Cx lesion is 30% stenosed.  3rd Mrg lesion is 30% stenosed.  1.  Left dominant coronary arteries with chronically occluded ostial LAD with some right to left and left to left collaterals.  Mild disease affecting the left circumflex. 2.  Right heart catheterization showed severely elevated filling pressures with RA pressure of 10 mmHg, pulmonary capillary wedge pressure of 31 mmHg with prominent V wave suggestive of mitral regurgitation, moderate pulmonary hypertension at 55/31 mmHg and mildly reduced cardiac output at 4.56 with a cardiac index of 2.47.  LVEDP was 34 mmHg. Recommendations: The patient likely had an LAD infarct 2 weeks ago.  I suspect that the anterior wall is now nonviable.  Unfortunately, she is left with severe ischemic cardiomyopathy and right heart catheterization shows evidence of severe volume overload likely responsible for her symptoms of shortness of breath and dry cough. I am going to start intravenous diuresis with furosemide 40 mg twice daily.  I added small dose carvedilol and losartan.  Recommend small dose spironolactone before hospital discharge and [facility]. Recommend aggressive medical therapy for coronary artery disease.   ECHOCARDIOGRAM COMPLETE  Result Date: 02/25/2020     ECHOCARDIOGRAM REPORT   Patient Name:   Hannah Martinez Date of Exam: 02/25/2020 Medical Rec #:  [id number]       Height:       65.0 in Accession #:    [id number]      Weight:       169.6 lb Date of Birth:  [birth year]       BSA:          1.844 m Patient Age:    63 years        BP:           108/80 mmHg Patient Gender: F               HR:           98 bpm. Exam Location:  [facility] Procedure: 2D Echo, Cardiac Doppler, Color Doppler and Intracardiac            Opacification Agent Indications:     Chest pain 786.50  History:         Patient has no prior history of Echocardiogram examinations.  Stroke.  Sonographer:     Cristela Blue RDCS (AE) Referring Phys:  [id number] Brien Few NIU Diagnosing Phys: Lorine Bears MD IMPRESSIONS  1. Left ventricular ejection fraction, by estimation, is 20 to 25%. The left ventricle has severely decreased function. The left ventricle demonstrates regional wall motion abnormalities (see scoring diagram/findings for description). The left ventricular internal cavity size was mildly dilated. Left ventricular diastolic parameters are consistent with Grade II diastolic dysfunction (pseudonormalization). There is akinesis of the left ventricular, mid-apical anteroseptal wall, anterior segment  and apical segment. Prominent apical trabeculation with no clear apical thrombus with echo contrast.  2. Right ventricular systolic function is normal. The right ventricular size is normal. Tricuspid regurgitation signal is inadequate for assessing PA pressure.  3. Left atrial size was mildly dilated.  4. The mitral valve is normal in structure and function. Mild to moderate mitral valve regurgitation. No evidence of mitral stenosis.  5. The aortic valve is normal in structure and function. Aortic valve regurgitation is not visualized. No aortic stenosis is present. FINDINGS  Left Ventricle: Left ventricular ejection fraction, by estimation, is 20 to 25%. The left ventricle has severely decreased function.  The left ventricle demonstrates regional wall motion abnormalities. Definity contrast agent was given IV to delineate the left ventricular endocardial borders. The left ventricular internal cavity size was mildly dilated. There is no left ventricular hypertrophy. Left ventricular diastolic parameters are consistent with Grade II diastolic dysfunction (pseudonormalization). Right Ventricle: The right ventricular size is normal. No increase in right ventricular wall thickness. Right ventricular systolic function is normal. Tricuspid regurgitation signal is inadequate for assessing PA pressure. The tricuspid regurgitant velocity is 2.54 m/s, and with an assumed right atrial pressure of 10 mmHg, the estimated right ventricular systolic pressure is 35.8 mmHg. Left Atrium: Left atrial size was mildly dilated. Right Atrium: Right atrial size was normal in size. Pericardium: There is no evidence of pericardial effusion. Mitral Valve: The mitral valve is normal in structure and function. Normal mobility of the mitral valve leaflets. Mild to moderate mitral valve regurgitation. No evidence of mitral valve stenosis. Tricuspid Valve: The tricuspid valve is normal in structure. Tricuspid valve regurgitation is trivial. No evidence of tricuspid stenosis. Aortic Valve: The aortic valve is normal in structure and function. Aortic valve regurgitation is not visualized. No aortic stenosis is present. Aortic valve mean gradient measures 2.0 mmHg. Aortic valve peak gradient measures 3.6 mmHg. Aortic valve area, by VTI measures 2.24 cm. Pulmonic Valve: The pulmonic valve was normal in structure. Pulmonic valve regurgitation is not visualized. No evidence of pulmonic stenosis. Aorta: The aortic root is normal in size and structure. Venous: The inferior vena cava was not well visualized. IAS/Shunts: No atrial level shunt detected by color flow Doppler.  LEFT VENTRICLE PLAX 2D LVIDd:         5.09 cm     Diastology LVIDs:         4.52 cm      LV e' lateral:   6.74 cm/s LV PW:         1.20 cm     LV E/e' lateral: 16.6 LV IVS:        0.84 cm     LV e' medial:    4.35 cm/s LVOT diam:     2.00 cm     LV E/e' medial:  25.7 LV SV:         42 LV SV Index:   23 LVOT Area:     3.14 cm  LV Volumes (MOD) LV vol d, MOD A2C: 98.5 ml LV vol d, MOD A4C: 99.0 ml LV vol s, MOD A2C: 82.4 ml LV vol s, MOD A4C: 79.4 ml LV SV MOD A2C:     16.1 ml LV SV MOD A4C:     99.0 ml LV SV MOD BP:      13.9 ml RIGHT VENTRICLE RV Basal diam:  2.80 cm RV S prime:     11.30 cm/s TAPSE (M-mode): 3.0 cm LEFT ATRIUM           Index LA diam:      3.20 cm 1.74 cm/m LA Vol (A2C): 51.1 ml 27.71 ml/m  AORTIC VALVE                   PULMONIC VALVE AV Area (Vmax):    1.99 cm    PV Vmax:        0.72 m/s AV Area (Vmean):   1.92 cm    PV Peak grad:   2.1 mmHg AV Area (VTI):     2.24 cm    RVOT Peak grad: 1 mmHg AV Vmax:           94.70 cm/s AV Vmean:          69.267 cm/s AV VTI:            0.190 m AV Peak Grad:      3.6 mmHg AV Mean Grad:      2.0 mmHg LVOT Vmax:         59.90 cm/s LVOT Vmean:        42.300 cm/s LVOT VTI:          0.135 m LVOT/AV VTI ratio: 0.71  AORTA Ao Root diam: 2.30 cm MITRAL VALVE                TRICUSPID VALVE MV Area (PHT): 5.75 cm     TR Peak grad:   25.8 mmHg MV Decel Time: 132 msec     TR Vmax:        254.00 cm/s MV E velocity: 112.00 cm/s MV A velocity: 86.30 cm/s   SHUNTS MV E/A ratio:  1.30         Systemic VTI:  0.14 m                             Systemic Diam: 2.00 cm Lorine BearsMuhammad Arida MD Electronically signed by Lorine BearsMuhammad Arida MD Signature Date/Time: 02/25/2020/11:30:53 AM    Final        Subjective: Denies any chest pain or shortness of breath.  Feels much better and wants to go home.  Discharge Exam: Vitals:   02/27/20 0329 02/27/20 0819  BP: 118/61 140/85  Pulse: 95 92  Resp:  16  Temp: 98.6 F (37 C) 97.8 F (36.6 C)  SpO2: 98% 99%   Vitals:   02/26/20 2036 02/27/20 0103 02/27/20 0329 02/27/20 0819  BP: (!) 89/75 94/65 118/61 140/85   Pulse: 96 99 95 92  Resp:    16  Temp:   98.6 F (37 C) 97.8 F (36.6 C)  TempSrc:   Oral Oral  SpO2:  96% 98% 99%  Weight:   73.8 kg   Height:        General: Middle-aged female not in distress HEENT: Moist mucosa, supple neck Chest: Clear bilaterally CVs: S1-S2, no murmurs GI: Soft, nondistended, nontender Musculoskeletal: Warm, no edema   The  results of significant diagnostics from this hospitalization (including imaging, microbiology, ancillary and laboratory) are listed below for reference.     Microbiology: Recent Results (from the past 240 hour(s))  Respiratory Panel by RT PCR (Flu A&B, Covid) - Nasopharyngeal Swab     Status: None   Collection Time: 02/24/20  3:31 PM   Specimen: Nasopharyngeal Swab  Result Value Ref Range Status   SARS Coronavirus 2 by RT PCR NEGATIVE NEGATIVE Final    Comment: (NOTE) SARS-CoV-2 target nucleic acids are NOT DETECTED. The SARS-CoV-2 RNA is generally detectable in upper respiratoy specimens during the acute phase of infection. The lowest concentration of SARS-CoV-2 viral copies this assay can detect is 131 copies/mL. A negative result does not preclude SARS-Cov-2 infection and should not be used as the sole basis for treatment or other patient management decisions. A negative result may occur with  improper specimen collection/handling, submission of specimen other than nasopharyngeal swab, presence of viral mutation(s) within the areas targeted by this assay, and inadequate number of viral copies (<131 copies/mL). A negative result must be combined with clinical observations, patient history, and epidemiological information. The expected result is Negative. Fact Sheet for Patients:  https://www.moore.com/ Fact Sheet for Healthcare Providers:  https://www.young.biz/ This test is not yet ap proved or cleared by the [place] FDA and  has been authorized for detection and/or diagnosis of  SARS-CoV-2 by FDA under an Emergency Use Authorization (EUA). This EUA will remain  in effect (meaning this test can be used) for the duration of the COVID-19 declaration under Section 564(b)(1) of the Act, 21 U.S.C. section 360bbb-3(b)(1), unless the authorization is terminated or revoked sooner.    Influenza A by PCR NEGATIVE NEGATIVE Final   Influenza B by PCR NEGATIVE NEGATIVE Final    Comment: (NOTE) The Xpert Xpress SARS-CoV-2/FLU/RSV assay is intended as an aid in  the diagnosis of influenza from Nasopharyngeal swab specimens and  should not be used as a sole basis for treatment. Nasal washings and  aspirates are unacceptable for Xpert Xpress SARS-CoV-2/FLU/RSV  testing. Fact Sheet for Patients: https://www.moore.com/ Fact Sheet for Healthcare Providers: https://www.young.biz/ This test is not yet approved or cleared by the [place] FDA and  has been authorized for detection and/or diagnosis of SARS-CoV-2 by  FDA under an Emergency Use Authorization (EUA). This EUA will remain  in effect (meaning this test can be used) for the duration of the  Covid-19 declaration under Section 564(b)(1) of the Act, 21  U.S.C. section 360bbb-3(b)(1), unless the authorization is  terminated or revoked. Performed at [facility], [address] [place], [place] [address]   C difficile quick scan w PCR reflex     Status: None   Collection Time: 02/24/20 11:06 PM   Specimen: STOOL  Result Value Ref Range Status   C Diff antigen NEGATIVE NEGATIVE Final   C Diff toxin NEGATIVE NEGATIVE Final   C Diff interpretation No C. difficile detected.  Final    Comment: Performed at [facility], [address] [place], [place] [address]     Labs: BNP (last 3 results) No results for input(s): BNP in the last 8760 hours. Basic Metabolic Panel: Recent Labs  Lab 02/24/20 1345 02/25/20 0352 02/26/20 0822 02/27/20 0613  NA 140 139 140  140  K 3.4* 4.1 3.6 3.6  CL 111 111 104 104  CO2 20* 19* 23 22  GLUCOSE 109* 112* 111* 101*  BUN 8 9 9 17   CREATININE 0.87 0.81 1.08*  1.25*  CALCIUM 8.7* 8.4* 9.3 9.4  MG  --   --   --  2.4   Liver Function Tests: No results for input(s): AST, ALT, ALKPHOS, BILITOT, PROT, ALBUMIN in the last 168 hours. No results for input(s): LIPASE, AMYLASE in the last 168 hours. No results for input(s): AMMONIA in the last 168 hours. CBC: Recent Labs  Lab 02/24/20 1345 02/25/20 0352 02/26/20 0628 02/27/20 0613  WBC 7.4 7.3 5.6 5.5  HGB 12.7 11.2* 14.1 14.5  HCT 38.9 34.3* 43.9 45.4  MCV 99.7 99.4 100.7* 100.4*  PLT 354 348 359 375   Cardiac Enzymes: No results for input(s): CKTOTAL, CKMB, CKMBINDEX, TROPONINI in the last 168 hours. BNP: Invalid input(s): POCBNP CBG: No results for input(s): GLUCAP in the last 168 hours. D-Dimer No results for input(s): DDIMER in the last 72 hours. Hgb A1c Recent Labs    02/25/20 0352  HGBA1C 4.9   Lipid Profile Recent Labs    02/25/20 0352  CHOL 181  HDL 45  LDLCALC 123*  TRIG 64  CHOLHDL 4.0   Thyroid function studies No results for input(s): TSH, T4TOTAL, T3FREE, THYROIDAB in the last 72 hours.  Invalid input(s): FREET3 Anemia work up No results for input(s): VITAMINB12, FOLATE, FERRITIN, TIBC, IRON, RETICCTPCT in the last 72 hours. Urinalysis    Component Value Date/Time   COLORURINE YELLOW (A) 11/22/2019 1131   APPEARANCEUR CLEAR (A) 11/22/2019 1131   LABSPEC 1.017 11/22/2019 1131   PHURINE 6.0 11/22/2019 1131   GLUCOSEU NEGATIVE 11/22/2019 1131   HGBUR NEGATIVE 11/22/2019 1131   BILIRUBINUR NEGATIVE 11/22/2019 1131   KETONESUR NEGATIVE 11/22/2019 1131   PROTEINUR NEGATIVE 11/22/2019 1131   NITRITE NEGATIVE 11/22/2019 1131   LEUKOCYTESUR NEGATIVE 11/22/2019 1131   Sepsis Labs Invalid input(s): PROCALCITONIN,  WBC,  LACTICIDVEN Microbiology Recent Results (from the past 240 hour(s))  Respiratory Panel by RT PCR (Flu  A&B, Covid) - Nasopharyngeal Swab     Status: None   Collection Time: 02/24/20  3:31 PM   Specimen: Nasopharyngeal Swab  Result Value Ref Range Status   SARS Coronavirus 2 by RT PCR NEGATIVE NEGATIVE Final    Comment: (NOTE) SARS-CoV-2 target nucleic acids are NOT DETECTED. The SARS-CoV-2 RNA is generally detectable in upper respiratoy specimens during the acute phase of infection. The lowest concentration of SARS-CoV-2 viral copies this assay can detect is 131 copies/mL. A negative result does not preclude SARS-Cov-2 infection and should not be used as the sole basis for treatment or other patient management decisions. A negative result may occur with  improper specimen collection/handling, submission of specimen other than nasopharyngeal swab, presence of viral mutation(s) within the areas targeted by this assay, and inadequate number of viral copies (<131 copies/mL). A negative result must be combined with clinical observations, patient history, and epidemiological information. The expected result is Negative. Fact Sheet for Patients:  https://www.moore.com/ Fact Sheet for Healthcare Providers:  https://www.young.biz/ This test is not yet ap proved or cleared by the [place] FDA and  has been authorized for detection and/or diagnosis of SARS-CoV-2 by FDA under an Emergency Use Authorization (EUA). This EUA will remain  in effect (meaning this test can be used) for the duration of the COVID-19 declaration under Section 564(b)(1) of the Act, 21 U.S.C. section 360bbb-3(b)(1), unless the authorization is terminated or revoked sooner.    Influenza A by PCR NEGATIVE NEGATIVE Final   Influenza B by PCR NEGATIVE NEGATIVE Final    Comment: (NOTE) The Xpert Xpress SARS-CoV-2/FLU/RSV  assay is intended as an aid in  the diagnosis of influenza from Nasopharyngeal swab specimens and  should not be used as a sole basis for treatment. Nasal washings  and  aspirates are unacceptable for Xpert Xpress SARS-CoV-2/FLU/RSV  testing. Fact Sheet for Patients: PinkCheek.be Fact Sheet for Healthcare Providers: GravelBags.it This test is not yet approved or cleared by the [place] FDA and  has been authorized for detection and/or diagnosis of SARS-CoV-2 by  FDA under an Emergency Use Authorization (EUA). This EUA will remain  in effect (meaning this test can be used) for the duration of the  Covid-19 declaration under Section 564(b)(1) of the Act, 21  U.S.C. section 360bbb-3(b)(1), unless the authorization is  terminated or revoked. Performed at [facility], [address] [place], [place] [address]   C difficile quick scan w PCR reflex     Status: None   Collection Time: 02/24/20 11:06 PM   Specimen: STOOL  Result Value Ref Range Status   C Diff antigen NEGATIVE NEGATIVE Final   C Diff toxin NEGATIVE NEGATIVE Final   C Diff interpretation No C. difficile detected.  Final    Comment: Performed at [facility], [address] [place], [place] [address]     Time coordinating discharge: 35 minutes  SIGNED:   Louellen Molder, MD  [facility] 02/27/2020, 1:47 PM Pager   If 7PM-7AM, please contact night-coverage [email or web address] Password [email or web address]

## 2020-02-27 NOTE — Care Management Important Message (Signed)
Important Message  Patient Details  Name: Ryenn Howeth MRN: [id number] Date of Birth: 02/03/1956   Medicare Important Message Given:  Yes     Johnell Comings 02/27/2020, 11:01 AM

## 2020-02-27 NOTE — Progress Notes (Signed)
Reported BP  90/60  HR  98 and MAP 73 to Dr Gonzella Lex.

## 2020-02-27 NOTE — Progress Notes (Signed)
Discharged to home with daughter.

## 2020-02-27 NOTE — Discharge Instructions (Signed)
Heart Attack A heart attack occurs when blood and oxygen supply to the heart is cut off. A heart attack causes damage to the heart that cannot be fixed. A heart attack is also called a myocardial infarction, or MI. If you think you are having a heart attack, do not wait to see if the symptoms will go away. Get medical help right away. What are the causes? This condition may be caused by:  A fatty substance (plaque) in the blood vessels (arteries). This can block the flow of blood to the heart.  A blood clot in the blood vessels that go to the heart. The blood clot blocks blood flow.  Low blood pressure.  An abnormal heartbeat.  Some diseases, such as problems in red blood cells (anemia)orproblems in breathing (respiratory failure).  Tightening (spasm) of a blood vessel that cuts off blood to the heart.  A tear in a blood vessel of the heart.  High blood pressure. What increases the risk? The following factors may make you more likely to develop this condition:  Aging. The older you are, the higher your risk.  Having a personal or family history of chest pain, heart attack, stroke, or narrowing of the arteries in the legs, arms, head, or stomach (peripheral artery disease).  Being female.  Smoking.  Not getting regular exercise.  Being overweight or obese.  Having high blood pressure.  Having high cholesterol.  Having diabetes.  Drinking too much alcohol.  Using illegal drugs, such as cocaine or methamphetamine. What are the signs or symptoms? Symptoms of this condition include:  Chest pain. It may feel like: ? Crushing or squeezing. ? Tightness, pressure, fullness, or heaviness.  Pain in the arm, neck, jaw, back, or upper body.  Shortness of breath.  Heartburn.  Upset stomach (indigestion).  Feeling like you may vomit (nauseous).  Cold sweats.  Feeling tired.  Sudden light-headedness. How is this treated? A heart attack must be treated as soon as  possible. Treatment may include:  Medicines to: ? Break up or dissolve blood clots. ? Thin blood and help prevent blood clots. ? Treat blood pressure. ? Improve blood flow to the heart. ? Reduce pain. ? Reduce cholesterol.  Procedures to widen a blocked artery and keep it open.  Open heart surgery.  Receiving oxygen.  Making your heart strong again (cardiac rehabilitation) through exercise, education, and counseling. Follow these instructions at home: Medicines  Take over-the-counter and prescription medicines only as told by your doctor. You may need to take medicine: ? To keep your blood from clotting too easily. ? To control blood pressure. ? To lower cholesterol. ? To control heart rhythms.  Do not take these medicines unless your doctor says it is okay: ? NSAIDs, such as ibuprofen. ? Supplements that have vitamin A, vitamin E, or both. ? Hormone replacement therapy that has estrogen with or without progestin. Lifestyle      Do not use any products that have nicotine or tobacco, such as cigarettes, e-cigarettes, and chewing tobacco. If you need help quitting, ask your doctor.  Avoid secondhand smoke.  Exercise regularly. Ask your doctor about a cardiac rehab program.  Eat heart-healthy foods. Your doctor will tell you what foods to eat.  Stay at a healthy weight.  Lower your stress level.  Do not use illegal drugs. Alcohol use  Do not drink alcohol if: ? Your doctor tells you not to drink. ? You are pregnant, may be pregnant, or are planning to become pregnant.    If you drink alcohol: ? Limit how much you use to:  0-1 drink a day for women.  0-2 drinks a day for men. ? Know how much alcohol is in your drink. In the [place] one drink equals one 12 oz bottle of beer (355 mL), one 5 oz glass of wine (148 mL), or one 1 oz glass of hard liquor (44 mL). General instructions  Work with your doctor to treat other problems you may have, such as diabetes or high  blood pressure.  Get screened for depression. Get treatment if needed.  Keep your vaccines up to date. Get the flu shot (influenza vaccine) every year.  Keep all follow-up visits as told by your doctor. This is important. Contact a doctor if:  You feel very sad.  You have trouble doing your daily activities. Get help right away if:  You have sudden, unexplained discomfort in your chest, arms, back, neck, jaw, or upper body.  You have shortness of breath.  You have sudden sweating or clammy skin.  You feel like you may vomit.  You vomit.  You feel tired or weak.  You get light-headed or dizzy.  You feel your heart beating fast.  You feel your heart skipping beats.  You have blood pressure that is higher than 180/120. These symptoms may be an emergency. Do not wait to see if the symptoms will go away. Get medical help right away. Call your local emergency services (911 in the [place] Do not drive yourself to the hospital. Summary  A heart attack occurs when blood and oxygen supply to the heart is cut off.  Do not take NSAIDs unless your doctor says it is okay.  Do not smoke. Avoid secondhand smoke.  Exercise regularly. Ask your doctor about a cardiac rehab program. This information is not intended to replace advice given to you by your health care provider. Make sure you discuss any questions you have with your health care provider. Document Revised: 03/26/2019 Document Reviewed: 03/26/2019 Elsevier Patient Education  2020 Elsevier Inc. Heart Failure, Diagnosis  Heart failure is a condition in which the heart has trouble pumping blood because it has become weak or stiff. This means that the heart does not pump blood well enough for the body to stay healthy. For some people with heart failure, fluid may back up into the lungs. There may also be swelling (edema) in the lower legs. Heart failure is usually a long-term (chronic) condition. It is important for you to take  good care of yourself and follow the treatment plan from your health care provider. What are the causes? This condition may be caused by:  High blood pressure (hypertension). Hypertension causes the heart muscle to work harder than normal. This makes the heart stiff or weak.  Coronary artery disease, or CAD. CAD is the buildup of cholesterol and fat (plaque) in the arteries of the heart.  Heart attack, also called myocardial infarction. This injures the heart muscle, making it hard for the heart to pump blood.  Abnormal heart valves. The valves do not open and close properly, forcing the heart to pump harder to keep the blood flowing.  Heart muscle disease (cardiomyopathy or myocarditis). This is damage to the heart muscle. It can increase the risk of heart failure.  Lung disease. The heart works harder when the lungs are not healthy.  Abnormal heart rhythms. These can lead to heart failure. What increases the risk? The risk of heart failure increases as a person ages.   This condition is also more likely to develop in people who:  Are overweight.  Are female.  Smoke or chew tobacco.  Abuse alcohol or illegal drugs.  Have taken medicines that can damage the heart, such as chemotherapy drugs.  Have diabetes.  Have abnormal heart rhythms.  Have thyroid problems.  Have low blood counts (anemia). What are the signs or symptoms? Symptoms of this condition include:  Shortness of breath with activity, such as when climbing stairs.  A cough that does not go away.  Swelling of the feet, ankles, legs, or abdomen.  Losing weight for no reason.  Trouble breathing when lying flat (orthopnea).  Waking from sleep because of the need to sit up and get more air.  Rapid heartbeat.  Tiredness (fatigue) and loss of energy.  Feeling light-headed, dizzy, or close to fainting.  Loss of appetite.  Nausea.  Waking up more often during the night to urinate  (nocturia).  Confusion. How is this diagnosed? This condition is diagnosed based on:  Your medical history, symptoms, and a physical exam.  Diagnostic tests, which may include: ? Echocardiogram. ? Electrocardiogram (ECG). ? Chest X-ray. ? Blood tests. ? Exercise stress test. ? Radionuclide scans. ? Cardiac catheterization and angiogram. How is this treated? Treatment for this condition is aimed at managing the symptoms of heart failure. Medicines Treatment may include medicines that:  Help lower blood pressure by relaxing (dilating) the blood vessels. These medicines are called ACE inhibitors (angiotensin-converting enzyme) and ARBs (angiotensin receptor blockers).  Cause the kidneys to remove salt and water from the blood through urination (diuretics).  Improve heart muscle strength and prevent the heart from beating too fast (beta blockers).  Increase the force of the heartbeat (digoxin). Healthy behavior changes     Treatment may also include making healthy lifestyle changes, such as:  Reaching and staying at a healthy weight.  Quitting smoking or chewing tobacco.  Eating heart-healthy foods.  Limiting or avoiding alcohol.  Stopping the use of illegal drugs.  Being physically active.  Other treatments Other treatments may include:  Procedures to open blocked arteries or repair damaged valves.  Placing a pacemaker to improve heart function (cardiac resynchronization therapy).  Placing a device to treat serious abnormal heart rhythms (implantable cardioverter defibrillator, or ICD).  Placing a device to improve the pumping ability of the heart (left ventricular assist device, or LVAD).  Receiving a healthy heart from a donor (heart transplant). This is done when other treatments have not helped. Follow these instructions at home:  Manage other health conditions as told by your health care provider. These may include hypertension, diabetes, thyroid disease,  or abnormal heart rhythms.  Get ongoing education and support as needed. Learn as much as you can about heart failure.  Keep all follow-up visits as told by your health care provider. This is important. Summary  Heart failure is a condition in which the heart has trouble pumping blood because it has become weak or stiff.  This condition is caused by high blood pressure and other diseases of the heart and lungs.  Symptoms of this condition include shortness of breath, tiredness (fatigue), nausea, and swelling of the feet, ankles, legs, or abdomen.  Treatments for this condition may include medicines, lifestyle changes, and surgery.  Manage other health conditions as told by your health care provider. This information is not intended to replace advice given to you by your health care provider. Make sure you discuss any questions you have with your health   care provider. Document Revised: 03/02/2019 Document Reviewed: 03/02/2019 Elsevier Patient Education  2020 Elsevier Inc.  

## 2020-03-10 ENCOUNTER — Ambulatory Visit
Admission: RE | Admit: 2020-03-10 | Discharge: 2020-03-10 | Disposition: A | Discharge status: Home / Self Care | Payer: Medicare Other | Source: Ambulatory Visit | Attending: Internal Medicine | Admitting: Internal Medicine

## 2020-03-10 DIAGNOSIS — Z1231 Encounter for screening mammogram for malignant neoplasm of breast: Secondary | ICD-10-CM | POA: Insufficient documentation

## 2020-03-12 NOTE — Progress Notes (Signed)
Office Visit    Patient Name: Hannah Martinez Date of Encounter: 03/13/2020  Primary Care Provider:  [facility] Primary Cardiologist:  Julien Nordmann, MD Electrophysiologist:  None   Chief Complaint    Hannah Martinez is a 64 y.o. female with a hx of CAD, ICM, CVA, HLD, GERD, cerebral aneurysm s/p clipping presents today for follow up after hospitalization with NSTEMI and newly reduced EF 20-25%.  Past Medical History    Past Medical History:  Diagnosis Date  . Cerebral aneurysm   . Chronic pain   . Degenerative joint disease   . Hypercholesteremia   . Stroke (HCC)   . TMJ (dislocation of temporomandibular joint)    Past Surgical History:  Procedure Laterality Date  . CEREBRAL ANEURYSM REPAIR    . RIGHT/LEFT HEART CATH AND CORONARY ANGIOGRAPHY N/A 02/25/2020   Procedure: RIGHT/LEFT HEART CATH AND CORONARY ANGIOGRAPHY;  Surgeon: Iran Ouch, MD;  Location: [facility] INVASIVE CV LAB;  Service: Cardiovascular;  Laterality: N/A;    Allergies  Allergies  Allergen Reactions  . Lipitor [Atorvastatin] Hives    History of Present Illness    Hannah Martinez is a 64 y.o. female with a hx of CAD, HFrEF, ICM, CVA, HLD, GERD, cerebral aneurysm s/p clipping last seen while hospitalized.  Her cerebral aneurysm was repaired in 1999 at [facility].  In 2003 she had progressive left hemiparesis and found to have large right middle cerebral artery aneurysm proximal aneurysmal clip with reoperation at [facility] 04/2002.  She had a lipoma removed from her right frontal bone 09/2012 complicated by MRSA infection.  Admitted 02/24/2020-03/25/2020 to [facility] after presenting with cough, wheezing, chest pain. Underwent echo 02/25/20 showing LVEF 20-25%, LV mildly dilated, akinesis in the LAD territory, RV normal size and function, mild-moderate MR. Underwent left and right heart cath 02/25/20 showed chronic occluded osital LAD with right to left and left to left collaterals, mild disease  (30%) to L Cx. RHC with severely elevated filling pressures - moderate pulmonary hypertension, mildly reduced cardiac output. Per Dr. Jari Sportsman cath report suspect LAD infarct 2 weeks prior to cath and question whether or not the anterior wall is viable. Her cough and shortness of breath were thought to be due to her severe volume overload. Diuresis and guideline directed therapy were initiated.  Labs reviewed from [facility] dated 03/03/2020:  Hemoglobin 14.2, white blood cell 4.5, platelets 307  Creatinine 0.97, GFR 72, K3.8, CA 9.7, AST 12, ALT 11]  She saw her primary health provider Dr. Lawerance Bach on 03/03/20. Reported feeling much better, sleeping on 2 pillows, and stopping marijuana use.   Reports no chest pain, pressure, tightness since discharge. Reports her dyspnea on exertion has significantly improved and no shortness of breath at rest. No edema, PND, palpitations.   We reviewed her cardiac catherization, echocardiogram, and medications in depth. Tells me she plans to focus on her health and has made big changes. Her children are helping to keep her accountable.   We reviewed heart failure education including sodium and fluid restriction (<2L) and she was appreciative of teaching. Discussed cardiac rehab and she is interesting in participating.   EKGs/Labs/Other Studies Reviewed:   The following studies were reviewed today:  LHC 02/25/2020  Ost LAD to Prox LAD lesion is 100% stenosed.  Mid Cx lesion is 30% stenosed.  3rd Mrg lesion is 30% stenosed. 1.  Left dominant coronary arteries with chronically occluded ostial LAD with some right to left and left to  left collaterals.  Mild disease affecting the left circumflex. 2.  Right heart catheterization showed severely elevated filling pressures with RA pressure of 10 mmHg, pulmonary capillary wedge pressure of 31 mmHg with prominent V wave suggestive of mitral regurgitation, moderate pulmonary hypertension at 55/31 mmHg and  mildly reduced cardiac output at 4.56 with a cardiac index of 2.47.  LVEDP was 34 mmHg. Recommendations: The patient likely had an LAD infarct 2 weeks ago.  I suspect that the anterior wall is now nonviable.  Unfortunately, she is left with severe ischemic cardiomyopathy and right heart catheterization shows evidence of severe volume overload likely responsible for her symptoms of shortness of breath and dry cough. I am going to start intravenous diuresis with furosemide 40 mg twice daily.  I added small dose carvedilol and losartan.  Recommend small dose spironolactone before hospital discharge and consider switching losartan to [facility] as an outpatient. Recommend aggressive medical therapy for coronary artery disease.     Echo 02/25/2020  1. Left ventricular ejection fraction, by estimation, is 20 to 25%. The  left ventricle has severely decreased function. The left ventricle  demonstrates regional wall motion abnormalities (see scoring  diagram/findings for description). The left  ventricular internal cavity size was mildly dilated. Left ventricular  diastolic parameters are consistent with Grade II diastolic dysfunction  (pseudonormalization). There is akinesis of the left ventricular,  mid-apical anteroseptal wall, anterior segment   and apical segment. Prominent apical trabeculation with no clear apical  thrombus with echo contrast.   2. Right ventricular systolic function is normal. The right ventricular  size is normal. Tricuspid regurgitation signal is inadequate for assessing  PA pressure.   3. Left atrial size was mildly dilated.   4. The mitral valve is normal in structure and function. Mild to moderate  mitral valve regurgitation. No evidence of mitral stenosis.   5. The aortic valve is normal in structure and function. Aortic valve  regurgitation is not visualized. No aortic stenosis is present.    EKG:  EKG is  ordered today.  The ekg ordered today demonstrates NSR 75 bpm  with stable in TWI in leads I, aVL, V2, V3-6 consistent with anterolateral injury  Recent Labs: 02/27/2020: BUN 17; Creatinine, Ser 1.25; Hemoglobin 14.5; Magnesium 2.4; Platelets 375; Potassium 3.6; Sodium 140  Recent Lipid Panel    Component Value Date/Time   CHOL 181 02/25/2020 0352   TRIG 64 02/25/2020 0352   HDL 45 02/25/2020 0352   CHOLHDL 4.0 02/25/2020 0352   VLDL 13 02/25/2020 0352   LDLCALC 123 (H) 02/25/2020 0352    Home Medications   No outpatient medications have been marked as taking for the 03/14/20 encounter (Appointment) with Loel Dubonnet, NP.      Review of Systems     Review of Systems  Constitution: Negative for chills, fever and malaise/fatigue.  Cardiovascular: Positive for dyspnea on exertion. Negative for chest pain, irregular heartbeat, leg swelling, near-syncope, orthopnea, palpitations and syncope.  Respiratory: Negative for cough, shortness of breath and wheezing.   Gastrointestinal: Negative for melena, nausea and vomiting.  Genitourinary: Negative for hematuria.  Neurological: Negative for dizziness, light-headedness and weakness.   All other systems reviewed and are otherwise negative except as noted above.  Physical Exam    VS:  There were no vitals taken for this visit. , BMI There is no height or weight on file to calculate BMI. GEN: Well nourished, well developed, in no acute distress. HEENT: normal. Neck: Supple, no JVD, carotid  bruits, or masses. Cardiac: RRR, no murmurs, rubs, or gallops. No clubbing, cyanosis, edema.  Radials/DP/PT 2+ and equal bilaterally.  Respiratory:  Respirations regular and unlabored, clear to auscultation bilaterally. GI: Soft, nontender, nondistended, BS + x 4. MS: No deformity or atrophy. Skin: Warm and dry, no rash. Neuro:  Strength and sensation are intact. Psych: Normal affect.  Assessment & Plan    1. CAD - Admitted with late presenting anterior STEMI 02/2020. Left and right heart cath 02/25/20 with  chronically occluded ostial LAD with right to left and left to left collaterals, mild dz (30%) to LCx. No anginal symptoms since discharge. Cath site healing appropriately.  GDMT includes aspirin, beta blocker, statin. Refill of Simvastatin provided, was switched from 20mg  to 40mg  while admitted.   Recommendation during cardiac cath was for cardiav viability study with MRI. Miss Manrique has undergone multiple cerebral aneurysm clipping and procedures and as such, MRI is likely not an option. Will discuss with her primary cardiologist whether he would like her to proceed with viability study via PET at [facility].  Cardiac rehab referral placed  2. HFrEF/ICM - Echo 02/25/20 with LVEF 20-25%. Euvolemic and well compensated on exam today. Reports some DOE - NYHA II. Labs post hospital admission with normal renal function and electrolytes by primary care.  GDMT Toprol 25mg , Losartan 12.5mg , Spironolactone 12.5mg , Lasix 40mg .  Further uptitration of therapy limited by low blood pressure (systolic routinely <100, but asymptomatic with no lightheadedness). She will monitor her BP at home and we will discuss in 1-week via phone. If BP permits, transition Losartan to low dose Entresto. Though will need to check for insurance coverage as may be financial constraint.  Recommend low sodium, heart healthy diet.   Recommend limit to 2L fluid per day  Weigh daily and report weight gain of 3 lb overnight or 5 lb in 1 week.  Cardiac rehab, as above.  3. Moderate pulmonary hypertension - RHC 02/25/20 with moderate pulmonary hypertension 55/31mmHg.  Continue Lasix 40mg  daily.   4. Mitral regurgitation - Noted on echo 02/2020. Asymptomatic with no SOB and no volume overload. Continue optimization of BP and volume status, as above.   Disposition: Follow up in 4 week(s) with Dr. or APP.    04/26/20, NP 03/13/2020, 4:47 PM

## 2020-03-14 ENCOUNTER — Encounter: Payer: Self-pay | Admitting: Family

## 2020-03-14 ENCOUNTER — Ambulatory Visit (INDEPENDENT_AMBULATORY_CARE_PROVIDER_SITE_OTHER): Payer: Medicare Other | Admitting: Family

## 2020-03-14 ENCOUNTER — Other Ambulatory Visit: Payer: Self-pay

## 2020-03-14 VITALS — BP 98/70 | HR 75 | Ht 65.0 in | Wt 166.2 lb

## 2020-03-14 DIAGNOSIS — I5041 Acute combined systolic (congestive) and diastolic (congestive) heart failure: Secondary | ICD-10-CM

## 2020-03-14 DIAGNOSIS — I255 Ischemic cardiomyopathy: Secondary | ICD-10-CM

## 2020-03-14 DIAGNOSIS — I2510 Atherosclerotic heart disease of native coronary artery without angina pectoris: Secondary | ICD-10-CM

## 2020-03-14 DIAGNOSIS — I5042 Chronic combined systolic (congestive) and diastolic (congestive) heart failure: Secondary | ICD-10-CM | POA: Diagnosis not present

## 2020-03-14 DIAGNOSIS — I272 Pulmonary hypertension, unspecified: Secondary | ICD-10-CM

## 2020-03-14 DIAGNOSIS — I639 Cerebral infarction, unspecified: Secondary | ICD-10-CM | POA: Diagnosis not present

## 2020-03-14 MED ORDER — SIMVASTATIN 40 MG PO TABS: 40.0000 mg | ORAL_TABLET | Freq: Every day | ORAL | 1 refills | Status: DC

## 2020-03-14 MED ORDER — BENZONATATE 100 MG PO CAPS: 100.0000 mg | ORAL_CAPSULE | Freq: Four times a day (QID) | ORAL | 0 refills | Status: DC | PRN

## 2020-03-14 NOTE — Patient Instructions (Signed)
We have put in a referral for you to go to cardiac rehab. They will call you to schedule this.  Medication Instructions:  Your physician recommends that you continue on your current medications as directed. Please refer to the Current Medication list given to you today.  *If you need a refill on your cardiac medications before your next appointment, please call your pharmacy*   Lab Work: None If you have labs (blood work) drawn today and your tests are completely normal, you will receive your results only by: Marland Kitchen MyChart Message (if you have MyChart) OR . A paper copy in the mail If you have any lab test that is abnormal or we need to change your treatment, we will call you to review the results.   Testing/Procedures: None   Follow-Up: At [facility], you and your health needs are our priority.  As part of our continuing mission to provide you with exceptional heart care, we have created designated Provider Care Teams.  These Care Teams include your primary Cardiologist (physician) and Advanced Practice Providers (APPs -  Physician Assistants and Nurse Practitioners) who all work together to provide you with the care you need, when you need it.  We recommend signing up for the patient portal called "MyChart".  Sign up information is provided on this After Visit Summary.  MyChart is used to connect with patients for Virtual Visits (Telemedicine).  Patients are able to view lab/test results, encounter notes, upcoming appointments, etc.  Non-urgent messages can be sent to your provider as well.   To learn more about what you can do with MyChart, go to [email or web address].    Your next appointment:   4 week(s)  The format for your next appointment:   In Person  Provider:    You may see Julien Nordmann, MD or one of the following Advanced Practice Providers on your designated Care Team:    Nicolasa Ducking, NP  Eula Listen, PA-C  Marisue Ivan, PA-C  Gillian Shields,  NP    Other Instructions Call us for weight gain of 3 pounds overnight or 5 pounds in a week.   Check blood pressure daily and Gillian Shields, NP will call you next week to check on your blood pressure.  Restrict fluids to about 2 L a day. (About 4 water bottles).

## 2020-03-19 ENCOUNTER — Other Ambulatory Visit: Payer: Self-pay | Admitting: *Deleted

## 2020-03-19 DIAGNOSIS — I5042 Chronic combined systolic (congestive) and diastolic (congestive) heart failure: Secondary | ICD-10-CM

## 2020-03-20 ENCOUNTER — Telehealth: Payer: Self-pay | Admitting: Family

## 2020-03-20 NOTE — Telephone Encounter (Signed)
Called patient to check on blood pressure readings at home. During recent office visit discussed possible transition from Losartan to [facility] pending blood pressure readings at home.  I left a voicemail to ask her to call back regarding blood pressure readings.    If her systolic blood pressure at home is consistently less than 110 or if she has not been checking her blood pressure regularly -  would like her to continue Losartan 12.5 mg (half tablet) daily. She should have enough to get to her next appointment as 30 tablets with 1 refill were sent on 02/28/20.  If her systolic blood pressure is consistently more than 110 at home, stop Losartan and start Entresto 24-26mg  BID. If she is unable to pick up from the pharmacy or collect samples at the office, this may wait until her upcoming follow up 04/11/20. Follow up labs may be done at her upcoming office visit.  Alver Sorrow, NP

## 2020-03-20 NOTE — Telephone Encounter (Signed)
Patient returning call.

## 2020-03-20 NOTE — Telephone Encounter (Signed)
Left voicemail message to call back for provider recommendations and provided our number.

## 2020-03-24 NOTE — Telephone Encounter (Signed)
Spoke to patient to review POC from Gillian Shields, NP.   Pt verbalized understanding and had no further questions at this time.   Advised pt to call for any further questions or concerns.

## 2020-04-09 ENCOUNTER — Other Ambulatory Visit: Payer: Self-pay | Admitting: Family

## 2020-04-09 ENCOUNTER — Telehealth: Payer: Self-pay | Admitting: Family

## 2020-04-09 MED ORDER — ASPIRIN 81 MG PO CHEW: 81.0000 mg | CHEWABLE_TABLET | Freq: Every day | ORAL | 0 refills | Status: DC

## 2020-04-09 NOTE — Progress Notes (Signed)
Office Visit    Patient Name: Hannah Martinez Date of Encounter: 04/12/2020  Primary Care Provider:  [facility] Primary Cardiologist:  Julien Nordmann, MD Electrophysiologist:  None   Chief Complaint    Hannah Martinez is a 64 y.o. female with a hx of HFrEF, CAD, ICM, CVA, HLD, GERD, cerebral aneurysm s/p clipping presents today for follow up of HFrEF/ICM.  Past Medical History    Past Medical History:  Diagnosis Date  . Cerebral aneurysm   . Chronic pain   . Degenerative joint disease   . Hypercholesteremia   . Stroke (HCC)   . TMJ (dislocation of temporomandibular joint)    Past Surgical History:  Procedure Laterality Date  . CEREBRAL ANEURYSM REPAIR    . RIGHT/LEFT HEART CATH AND CORONARY ANGIOGRAPHY N/A 02/25/2020   Procedure: RIGHT/LEFT HEART CATH AND CORONARY ANGIOGRAPHY;  Surgeon: Iran Ouch, MD;  Location: [facility] INVASIVE CV LAB;  Service: Cardiovascular;  Laterality: N/A;    Allergies  Allergies  Allergen Reactions  . Lipitor [Atorvastatin] Hives    History of Present Illness    Hannah Martinez is a 64 y.o. female with a hx of CAD, HFrEF/ICM, CVA, HLD, GERD, cerebral aneurysm s/p clipping last seen 03/14/20.  Her cerebral aneurysm was repaired in 1999 at [facility].  In 2003 she had progressive left hemiparesis and found to have large right middle cerebral artery aneurysm proximal aneurysmal clip with reoperation at [facility] 04/2002.  She had a lipoma removed from her right frontal bone 09/2012 complicated by MRSA infection.   Admitted 02/24/2020-03/25/2020 to [facility] after presenting with cough, wheezing, chest pain. Underwent echo 02/25/20 showing LVEF 20-25%, LV mildly dilated, akinesis in the LAD territory, RV normal size and function, mild-moderate MR. Underwent left and right heart cath 02/25/20 showed chronic occluded osital LAD with right to left and left to left collaterals, mild disease (30%) to L Cx. RHC with severely elevated filling  pressures - moderate pulmonary hypertension, mildly reduced cardiac output. Per Dr. Jari Sportsman cath report suspect LAD infarct 2 weeks prior to cath and question whether or not the anterior wall is viable. Her cough and shortness of breath were thought to be due to her severe volume overload. Diuresis and guideline directed therapy were initiated.   Labs reviewed from [facility] dated 03/03/2020:  Hemoglobin 14.2, white blood cell 4.5, platelets 307  Creatinine 0.97, GFR 72, K3.8, CA 9.7, AST 12, ALT 11]  At last visit 03/14/20 she reported feeling much better, stable 2 pillow orthopnea, and quitting smoking marijuana.  In discussion with Dr. Kirke Corin (physician performing cath) and Dr. Mariah Milling (her primary cardiologist) it was recommended to maximize medical therapy to see her clinical progress. She is unable to undergo cardiac MRI for cardiac viability due to metal in her head from cerebral artery aneurysm repair. If she has anginal symptoms, a CTO PCI of LAD could be considered.   Continues to be very pleased with her response to medications. She will have very intermittent cough noted especially when she bends over. No chest pain, pressure, tightness. No lower extremity edema, orthopnea. Reports no shortness of breath at rest. Notes stable DOE with more than usual activity.   Has gained 3 lbs since last office visit. Does not add salt to her food, but endorses "cheating" on occasion and eating fried fish. Received an air fryer for Easter and is excited to begin to use it to make heart healthy meals.     EKGs/Labs/Other Studies Reviewed:  The following studies were reviewed today:  LHC 02/25/2020  Ost LAD to Prox LAD lesion is 100% stenosed.  Mid Cx lesion is 30% stenosed.  3rd Mrg lesion is 30% stenosed. 1.  Left dominant coronary arteries with chronically occluded ostial LAD with some right to left and left to left collaterals.  Mild disease affecting the left circumflex. 2.   Right heart catheterization showed severely elevated filling pressures with RA pressure of 10 mmHg, pulmonary capillary wedge pressure of 31 mmHg with prominent V wave suggestive of mitral regurgitation, moderate pulmonary hypertension at 55/31 mmHg and mildly reduced cardiac output at 4.56 with a cardiac index of 2.47.  LVEDP was 34 mmHg. Recommendations: The patient likely had an LAD infarct 2 weeks ago.  I suspect that the anterior wall is now nonviable.  Unfortunately, she is left with severe ischemic cardiomyopathy and right heart catheterization shows evidence of severe volume overload likely responsible for her symptoms of shortness of breath and dry cough. I am going to start intravenous diuresis with furosemide 40 mg twice daily.  I added small dose carvedilol and losartan.  Recommend small dose spironolactone before hospital discharge and consider switching losartan to [facility] as an outpatient. Recommend aggressive medical therapy for coronary artery disease.     Echo 02/25/2020  1. Left ventricular ejection fraction, by estimation, is 20 to 25%. The  left ventricle has severely decreased function. The left ventricle  demonstrates regional wall motion abnormalities (see scoring  diagram/findings for description). The left  ventricular internal cavity size was mildly dilated. Left ventricular  diastolic parameters are consistent with Grade II diastolic dysfunction  (pseudonormalization). There is akinesis of the left ventricular,  mid-apical anteroseptal wall, anterior segment   and apical segment. Prominent apical trabeculation with no clear apical  thrombus with echo contrast.   2. Right ventricular systolic function is normal. The right ventricular  size is normal. Tricuspid regurgitation signal is inadequate for assessing  PA pressure.   3. Left atrial size was mildly dilated.   4. The mitral valve is normal in structure and function. Mild to moderate  mitral valve regurgitation.  No evidence of mitral stenosis.   5. The aortic valve is normal in structure and function. Aortic valve  regurgitation is not visualized. No aortic stenosis is present.   EKG:  EKG is ordered today.  The ekg ordered today demonstrates SR 77 bpm with stable septal wave inversions (lead V1, V2). T wave abnormality noted with overall flat T-waves in lateral leads, but improved compared to previous.   Recent Labs: 02/27/2020: Hemoglobin 14.5; Magnesium 2.4; Platelets 375 04/11/2020: BUN 15; Creatinine, Ser 0.87; Potassium 4.6; Sodium 141  Recent Lipid Panel    Component Value Date/Time   CHOL 181 02/25/2020 0352   TRIG 64 02/25/2020 0352   HDL 45 02/25/2020 0352   CHOLHDL 4.0 02/25/2020 0352   VLDL 13 02/25/2020 0352   LDLCALC 123 (H) 02/25/2020 0352    Home Medications   Current Meds  Medication Sig  . aspirin 81 MG chewable tablet Chew 1 tablet (81 mg total) by mouth daily.  . benzonatate (TESSALON PERLES) 100 MG capsule Take 1 capsule (100 mg total) by mouth every 6 (six) hours as needed for cough.  . fluticasone (FLONASE) 50 MCG/ACT nasal spray Place 1-2 sprays into both nostrils daily as needed for allergies.  . furosemide (LASIX) 40 MG tablet Take 1 tablet (40 mg total) by mouth 2 (two) times daily.  Marland Kitchen gabapentin (NEURONTIN) 300 MG capsule  Take 300 mg by mouth 3 (three) times daily. (take each capsule with 600mg  tablet to equal 900mg  three times a day)  . gabapentin (NEURONTIN) 600 MG tablet Take 600 mg by mouth 3 (three) times daily. (take each tablet with 300mg  capsule to equal 900mg  three times a day)  . HYDROcodone-acetaminophen (NORCO/VICODIN) 5-325 MG tablet Take 1 tablet by mouth every 6 (six) hours as needed for moderate pain.  losartan (COZAAR) 25 MG tablet Take 0.5 tablets (12.5 mg total) by mouth daily.  . meloxicam (MOBIC) 7.5 MG tablet Take 7.5 mg by mouth daily.  . metoprolol succinate (TOPROL-XL) 25 MG 24 hr tablet Take 1 tablet (25 mg total) by mouth daily.   omeprazole (PRILOSEC) 20 MG capsule Take 20 mg by mouth daily.  . simvastatin (ZOCOR) 40 MG tablet Take 1 tablet (40 mg total) by mouth at bedtime.  spironolactone (ALDACTONE) 25 MG tablet Take 0.5 tablets (12.5 mg total) by mouth daily.  . [DISCONTINUED] benzonatate (TESSALON PERLES) 100 MG capsule Take 1 capsule (100 mg total) by mouth every 6 (six) hours as needed for cough.  . [DISCONTINUED] furosemide (LASIX) 40 MG tablet Take 1 tablet (40 mg total) by mouth 2 (two) times daily.  . [DISCONTINUED] losartan (COZAAR) 25 MG tablet Take 0.5 tablets (12.5 mg total) by mouth daily.  . [DISCONTINUED] metoprolol succinate (TOPROL-XL) 25 MG 24 hr tablet Take 1 tablet (25 mg total) by mouth daily.  . [DISCONTINUED] spironolactone (ALDACTONE) 25 MG tablet Take 0.5 tablets (12.5 mg total) by mouth daily.    Review of Systems     Review of Systems  Constitution: Negative for chills, fever and malaise/fatigue.  Cardiovascular: Positive for dyspnea on exertion. Negative for chest pain, leg swelling, near-syncope, orthopnea, palpitations and syncope.  Respiratory: Positive for cough. Negative for shortness of breath and wheezing.   Gastrointestinal: Negative for nausea and vomiting.  Neurological: Negative for dizziness, light-headedness and weakness.   All other systems reviewed and are otherwise negative except as noted above.  Physical Exam    VS:  BP 102/68 (BP Location: Right Arm, Patient Position: Sitting, Cuff Size: Normal)   Pulse 77   Ht 5\' 4"  (1.626 m)   Wt 169 lb 4 oz (76.8 kg)   BMI 29.05 kg/m  , BMI Body mass index is 29.05 kg/m. GEN: Well nourished, well developed, in no acute distress. HEENT: normal. Neck: Supple, no JVD, carotid bruits, or masses. Cardiac: RRR, no murmurs, rubs, or gallops. No clubbing, cyanosis, edema.  Radials/DP/PT 2+ and equal bilaterally.  Respiratory:  Respirations regular and unlabored, clear to auscultation bilaterally. GI: Soft, nontender,  nondistended, BS + x 4. MS: No deformity or atrophy. Skin: Warm and dry, no rash. Neuro:  Strength and sensation are intact. Psych: Normal affect.  Assessment & Plan    1. CAD - Late presenting anterior STEMI 02/2020. R/LHC 02/25/20 with CTO ostial LAD with right to left and left ot left collaterals, mild dz (30%) to LCx. Stable with no anginal symptoms. No indication for ischemic evaluation at this time. Per discussion with Dr. Marland Kitchen and Dr. Marland Kitchen will defer viability study as she cannot undergo MRI due to metal from cerebral aneurysm repair and she is without anginal symptoms on current medication regimen.   GDMT includes aspirin, beta blocker, statin.  Cardiac rehab referral previously placed. Encouraged to participate.   2. HFrEF/ICM - Echo 02/25/20 LVEF 20-25%. Up 3 pounds from last visit with no noted edema. Endorses some dietary indiscretion.  Reports no SOB, edema - does not DOE. NYHA II.    Continue GDMT including Losartan 25mg , Toprol 25mg , Spironolactone 12.5mg , Lasix 40mg  BID. Refills provided today. Anticipate transition to will be cost prohibitive. Will defer transition to her primary cardiologist.   Low sodium diet encouraged. Provided dietary education materials.   Daily weights encouraged. Will report gain of 3lb overnight or 5lb in one week.   3. Moderate pulmonary HTN - Echo 02/2020 with mod pulmonary HTN 55/66mmHg. Continue Lasix at present dose.   4. Mitral regurgitation - Noted by echo 02/2020. Continue to optimize blood pressure and fluid status.   5. HLD, LDL goal <70 - 02/25/20 LDL 21m. Pravastatin increased from 20mg  to 40mg  at time of discharge 03/14/20. Plan for lipid panel at next visit.   Disposition: Follow up in 2 month(s) with Dr. 04/26/20 or APP   924, NP 04/12/2020, 12:24 PM

## 2020-04-09 NOTE — Telephone Encounter (Signed)
°*  STAT* If patient is at the pharmacy, call can be transferred to refill team.   1. Which medications need to be refilled? (please list name of each medication and dose if known) aspirin 81 mg  2. Which pharmacy/location (including street and city if local pharmacy) is medication to be sent to? [facility] [place]  3. Do they need a 30 day or 90 day supply? 90

## 2020-04-09 NOTE — Telephone Encounter (Signed)
aspirin 81 MG chewable tablet 90 tablet 0 04/09/2020    Sig - Route: Chew 1 tablet (81 mg total) by mouth daily. - Oral   Sent to pharmacy as: aspirin 81 MG chewable tablet   E-Prescribing Status: Receipt confirmed by pharmacy (04/09/2020  3:14 PM EDT)   Pharmacy  [id number] [place], [place] - [address]

## 2020-04-11 ENCOUNTER — Other Ambulatory Visit
Admission: RE | Admit: 2020-04-11 | Discharge: 2020-04-11 | Disposition: A | Discharge status: Home / Self Care | Payer: Medicare Other | Source: Ambulatory Visit | Attending: Family | Admitting: Family

## 2020-04-11 ENCOUNTER — Ambulatory Visit (INDEPENDENT_AMBULATORY_CARE_PROVIDER_SITE_OTHER): Payer: Medicare Other | Admitting: Family

## 2020-04-11 ENCOUNTER — Encounter: Payer: Self-pay | Admitting: Family

## 2020-04-11 ENCOUNTER — Other Ambulatory Visit: Payer: Self-pay

## 2020-04-11 ENCOUNTER — Telehealth: Payer: Self-pay

## 2020-04-11 VITALS — BP 102/68 | HR 77 | Ht 64.0 in | Wt 169.2 lb

## 2020-04-11 DIAGNOSIS — I255 Ischemic cardiomyopathy: Secondary | ICD-10-CM | POA: Diagnosis not present

## 2020-04-11 DIAGNOSIS — I5042 Chronic combined systolic (congestive) and diastolic (congestive) heart failure: Secondary | ICD-10-CM

## 2020-04-11 DIAGNOSIS — I272 Pulmonary hypertension, unspecified: Secondary | ICD-10-CM | POA: Diagnosis not present

## 2020-04-11 DIAGNOSIS — I2510 Atherosclerotic heart disease of native coronary artery without angina pectoris: Secondary | ICD-10-CM

## 2020-04-11 DIAGNOSIS — I340 Nonrheumatic mitral (valve) insufficiency: Secondary | ICD-10-CM

## 2020-04-11 DIAGNOSIS — E785 Hyperlipidemia, unspecified: Secondary | ICD-10-CM

## 2020-04-11 LAB — BASIC METABOLIC PANEL
Anion gap: 7 (ref 5–15)
BUN: 15 mg/dL (ref 8–23)
CO2: 23 mmol/L (ref 22–32)
Calcium: 9.5 mg/dL (ref 8.9–10.3)
Chloride: 111 mmol/L (ref 98–111)
Creatinine, Ser: 0.87 mg/dL (ref 0.44–1.00)
GFR calc Af Amer: >60 mL/min (ref >60)
GFR calc non Af Amer: >60 mL/min (ref >60)
Glucose, Bld: 95 mg/dL (ref 70–99)
Potassium: 4.6 mmol/L (ref 3.5–5.1)
Sodium: 141 mmol/L (ref 135–145)

## 2020-04-11 MED ORDER — LOSARTAN POTASSIUM 25 MG PO TABS: 12.5000 mg | ORAL_TABLET | Freq: Every day | ORAL | 1 refills | Status: DC

## 2020-04-11 MED ORDER — BENZONATATE 100 MG PO CAPS: 100.0000 mg | ORAL_CAPSULE | Freq: Four times a day (QID) | ORAL | 0 refills | Status: DC | PRN

## 2020-04-11 MED ORDER — SPIRONOLACTONE 25 MG PO TABS: 12.5000 mg | ORAL_TABLET | Freq: Every day | ORAL | 1 refills | Status: DC

## 2020-04-11 MED ORDER — METOPROLOL SUCCINATE ER 25 MG PO TB24: 25.0000 mg | ORAL_TABLET | Freq: Every day | ORAL | 1 refills | Status: DC

## 2020-04-11 MED ORDER — FUROSEMIDE 40 MG PO TABS: 40.0000 mg | ORAL_TABLET | Freq: Two times a day (BID) | ORAL | 1 refills | Status: DC

## 2020-04-11 NOTE — Telephone Encounter (Signed)
-----   Message from Alver Sorrow, NP sent at 04/11/2020 12:11 PM EDT ----- Electrolytes normal. Kidney function now within normal limites. Improvement from last lab draw. Good result!

## 2020-04-11 NOTE — Patient Instructions (Addendum)
Medication Instructions:  No medication changes today.   *If you need a refill on your cardiac medications before your next appointment, please call your pharmacy*   Lab Work: Your physician recommends that you return for lab work today: BMET  If you have labs (blood work) drawn today and your tests are completely normal, you will receive your results only by: Marland Kitchen MyChart Message (if you have MyChart) OR . A paper copy in the mail If you have any lab test that is abnormal or we need to change your treatment, we will call you to review the results.   Testing/Procedures: Your EKG today shows normal sinus rhythm and stable changes consistent with your cardiac catheterization results from recent hospitalization.   No need for further testing at this time. We will consider repeating your echocardiogram late summer to reassess your heart's pumping function while on these medications.   Follow-Up: At [facility], you and your health needs are our priority.  As part of our continuing mission to provide you with exceptional heart care, we have created designated Provider Care Teams.  These Care Teams include your primary Cardiologist (physician) and Advanced Practice Providers (APPs -  Physician Assistants and Nurse Practitioners) who all work together to provide you with the care you need, when you need it.  We recommend signing up for the patient portal called "MyChart".  Sign up information is provided on this After Visit Summary.  MyChart is used to connect with patients for Virtual Visits (Telemedicine).  Patients are able to view lab/test results, encounter notes, upcoming appointments, etc.  Non-urgent messages can be sent to your provider as well.   To learn more about what you can do with MyChart, go to [email or web address].    Your next appointment:   2 month(s)  The format for your next appointment:   In Person  Provider:    You may see Julien Nordmann, MD or one of the  following Advanced Practice Providers on your designated Care Team:    Nicolasa Ducking, NP  Eula Listen, PA-C  Marisue Ivan, PA-C   Other Instructions   We are so glad you are doing well!!   Avocado oil is good for light frying. You can use a small bit in a pan.    Use your air fryer - it is great for reducing cholesterol and protect your heart.    Recommend lowering your salt intake. Try to avoid salting your food. You can use garlic powder or onion powder instead.    You may take your blood pressure on your left arm. If you get readings always more than 130/80 or always less than 110/70, please call our office.   Recommend weighing yourself daily. If you gain 3 pounds overnight or 5 pounds in 1 week, please call us at this can be a sign of extra fluid.   Heart-Healthy Eating Plan Heart-healthy meal planning includes:  Eating less unhealthy fats.  Eating more healthy fats.  Making other changes in your diet. Talk with your doctor or a diet specialist (dietitian) to create an eating plan that is right for you.  What are tips for following this plan? Cooking Avoid frying your food. Try to bake, boil, grill, or broil it instead. You can also reduce fat by:  Removing the skin from poultry.  Removing all visible fats from meats.  Steaming vegetables in water or broth. Meal planning   At meals, divide your plate into four equal parts: ? Fill one-half of  your plate with vegetables and green salads. ? Fill one-fourth of your plate with whole grains. ? Fill one-fourth of your plate with lean protein foods.  Eat 4-5 servings of vegetables per day. A serving of vegetables is: ? 1 cup of raw or cooked vegetables. ? 2 cups of raw leafy greens.  Eat 4-5 servings of fruit per day. A serving of fruit is: ? 1 medium whole fruit. ?  cup of dried fruit. ?  cup of fresh, frozen, or canned fruit. ?  cup of 100% fruit juice.  Eat more foods that have soluble fiber.  These are apples, broccoli, carrots, beans, peas, and barley. Try to get 20-30 g of fiber per day.  Eat 4-5 servings of nuts, legumes, and seeds per week: ? 1 serving of dried beans or legumes equals  cup after being cooked. ? 1 serving of nuts is  cup. ? 1 serving of seeds equals 1 tablespoon. General information  Eat more home-cooked food. Eat less restaurant, buffet, and fast food.  Limit or avoid alcohol.  Limit foods that are high in starch and sugar.  Avoid fried foods.  Lose weight if you are overweight.  Keep track of how much salt (sodium) you eat. This is important if you have high blood pressure. Ask your doctor to tell you more about this.  Try to add vegetarian meals each week. Fats  Choose healthy fats. These include olive oil and canola oil, flaxseeds, walnuts, almonds, and seeds.  Eat more omega-3 fats. These include salmon, mackerel, sardines, tuna, flaxseed oil, and ground flaxseeds. Try to eat fish at least 2 times each week.  Check food labels. Avoid foods with trans fats or high amounts of saturated fat.  Limit saturated fats. ? These are often found in animal products, such as meats, butter, and cream. ? These are also found in plant foods, such as palm oil, palm kernel oil, and coconut oil.  Avoid foods with partially hydrogenated oils in them. These have trans fats. Examples are stick margarine, some tub margarines, cookies, crackers, and other baked goods. What foods can I eat? Fruits All fresh, canned (in natural juice), or frozen fruits. Vegetables Fresh or frozen vegetables (raw, steamed, roasted, or grilled). Green salads. Grains Most grains. Choose whole wheat and whole grains most of the time. Rice and pasta, including brown rice and pastas made with whole wheat. Meats and other proteins Lean, well-trimmed beef, veal, pork, and lamb. Chicken and [place] without skin. All fish and shellfish. Wild duck, rabbit, pheasant, and venison. Egg whites  or low-cholesterol egg substitutes. Dried beans, peas, lentils, and tofu. Seeds and most nuts. Dairy Low-fat or nonfat cheeses, including ricotta and mozzarella. Skim or 1% milk that is liquid, powdered, or evaporated. Buttermilk that is made with low-fat milk. Nonfat or low-fat yogurt. Fats and oils Non-hydrogenated (trans-free) margarines. Vegetable oils, including soybean, sesame, sunflower, olive, peanut, safflower, corn, canola, and cottonseed. Salad dressings or mayonnaise made with a vegetable oil. Beverages Mineral water. Coffee and tea. Diet carbonated beverages. Sweets and desserts Sherbet, gelatin, and fruit ice. Small amounts of dark chocolate. Limit all sweets and desserts. Seasonings and condiments All seasonings and condiments. The items listed above may not be a complete list of foods and drinks you can eat. Contact a dietitian for more options. What foods should I avoid? Fruits Canned fruit in heavy syrup. Fruit in cream or butter sauce. Fried fruit. Limit coconut. Vegetables Vegetables cooked in cheese, cream, or butter sauce. Fried vegetables.  Grains Breads that are made with saturated or trans fats, oils, or whole milk. Croissants. Sweet rolls. Donuts. High-fat crackers, such as cheese crackers. Meats and other proteins Fatty meats, such as hot dogs, ribs, sausage, bacon, rib-eye roast or steak. High-fat deli meats, such as salami and bologna. Caviar. Domestic duck and goose. Organ meats, such as liver. Dairy Cream, sour cream, cream cheese, and creamed cottage cheese. Whole-milk cheeses. Whole or 2% milk that is liquid, evaporated, or condensed. Whole buttermilk. Cream sauce or high-fat cheese sauce. Yogurt that is made from whole milk. Fats and oils Meat fat, or shortening. Cocoa butter, hydrogenated oils, palm oil, coconut oil, palm kernel oil. Solid fats and shortenings, including bacon fat, salt pork, lard, and butter. Nondairy cream substitutes. Salad dressings with  cheese or sour cream. Beverages Regular sodas and juice drinks with added sugar. Sweets and desserts Frosting. Pudding. Cookies. Cakes. Pies. Milk chocolate or white chocolate. Buttered syrups. Full-fat ice cream or ice cream drinks. The items listed above may not be a complete list of foods and drinks to avoid. Contact a dietitian for more information. Summary  Heart-healthy meal planning includes eating less unhealthy fats, eating more healthy fats, and making other changes in your diet.  Eat a balanced diet. This includes fruits and vegetables, low-fat or nonfat dairy, lean protein, nuts and legumes, whole grains, and heart-healthy oils and fats. This information is not intended to replace advice given to you by your health care provider. Make sure you discuss any questions you have with your health care provider. Document Revised: 02/16/2018 Document Reviewed: 01/20/2018 Elsevier Patient Education  2020 [employer].

## 2020-04-11 NOTE — Telephone Encounter (Signed)
Attempted to call patient. LMTCB 04/11/2020   

## 2020-04-14 NOTE — Telephone Encounter (Signed)
Call to patient to review labs.    Left detailed message for patient and encouraged her to call back with any further questions.    No further orders.

## 2020-04-23 ENCOUNTER — Encounter: Payer: Medicare Other | Attending: Cardiovascular Disease | Admitting: *Deleted

## 2020-04-23 ENCOUNTER — Other Ambulatory Visit: Payer: Self-pay

## 2020-04-23 DIAGNOSIS — Z7982 Long term (current) use of aspirin: Secondary | ICD-10-CM | POA: Insufficient documentation

## 2020-04-23 DIAGNOSIS — Z79899 Other long term (current) drug therapy: Secondary | ICD-10-CM | POA: Insufficient documentation

## 2020-04-23 DIAGNOSIS — Z8673 Personal history of transient ischemic attack (TIA), and cerebral infarction without residual deficits: Secondary | ICD-10-CM | POA: Insufficient documentation

## 2020-04-23 DIAGNOSIS — G8929 Other chronic pain: Secondary | ICD-10-CM | POA: Insufficient documentation

## 2020-04-23 DIAGNOSIS — I5042 Chronic combined systolic (congestive) and diastolic (congestive) heart failure: Secondary | ICD-10-CM

## 2020-04-23 DIAGNOSIS — E7800 Pure hypercholesterolemia, unspecified: Secondary | ICD-10-CM | POA: Insufficient documentation

## 2020-04-23 NOTE — Progress Notes (Signed)
Virtual Orientation completed today. Has EP eval and Gym Orientation scheduled for tomorrow am. Documentation of diagnosis can be found in [facility] 02/24/2020.

## 2020-04-24 ENCOUNTER — Encounter: Payer: Medicare Other | Admitting: *Deleted

## 2020-04-24 VITALS — Ht 66.0 in | Wt 170.8 lb

## 2020-04-24 DIAGNOSIS — I5042 Chronic combined systolic (congestive) and diastolic (congestive) heart failure: Secondary | ICD-10-CM | POA: Diagnosis not present

## 2020-04-24 DIAGNOSIS — Z8673 Personal history of transient ischemic attack (TIA), and cerebral infarction without residual deficits: Secondary | ICD-10-CM | POA: Diagnosis not present

## 2020-04-24 DIAGNOSIS — E7800 Pure hypercholesterolemia, unspecified: Secondary | ICD-10-CM | POA: Diagnosis not present

## 2020-04-24 DIAGNOSIS — Z7982 Long term (current) use of aspirin: Secondary | ICD-10-CM | POA: Diagnosis not present

## 2020-04-24 DIAGNOSIS — Z79899 Other long term (current) drug therapy: Secondary | ICD-10-CM | POA: Diagnosis not present

## 2020-04-24 DIAGNOSIS — G8929 Other chronic pain: Secondary | ICD-10-CM | POA: Diagnosis not present

## 2020-04-24 NOTE — Progress Notes (Signed)
Cardiac Individual Treatment Plan  Patient Details  Name: Hannah Martinez MRN: [id number] Date of Birth: 03/10/1956 Referring Provider:     Cardiac Rehab from 04/24/2020 in [facility] Cardiac and Pulmonary Re[facility]  Referring Provider  Gollan, Timothy MD      Initial Encounter Date:    Cardiac Rehab from 04/24/2020 in [facility] Cardiac [facility] Re[facility]  Date  04/24/20      Visit Diagnosis: Heart failure, systolic and diastolic, chronic (HCC)  Patient's Home Medications on Admission:  Current Outpatient Medications:  .  aspirin 81 MG chewable tablet, Chew 1 tablet (81 mg total) by mouth daily., Disp: 90 tablet, Rfl: 0 .  benzonatate (TESSALON PERLES) 100 MG capsule, Take 1 capsule (100 mg total) by mouth every 6 (six) hours as needed for cough., Disp: 30 capsule, Rfl: 0 .  fluticasone (FLONASE) 50 MCG/ACT nasal spray, Place 1-2 sprays into both nostrils daily as needed for allergies., Disp: , Rfl:  .  furosemide (LASIX) 40 MG tablet, Take 1 tablet (40 mg total) by mouth 2 (two) times daily., Disp: 180 tablet, Rfl: 1 .  gabapentin (NEURONTIN) 300 MG capsule, Take 300 mg by mouth 3 (three) times daily. (take each capsule with 600mg tablet to equal 900mg three times a day), Disp: , Rfl:  .  gabapentin (NEURONTIN) 600 MG tablet, Take 600 mg by mouth 3 (three) times daily. (take each tablet with 300mg capsule to equal 900mg three times a day), Disp: , Rfl:  .  HYDROcodone-acetaminophen (NORCO/VICODIN) 5-325 MG tablet, Take 1 tablet by mouth every 6 (six) hours as needed for moderate pain. (Patient not taking: Reported on 04/23/2020), Disp: 10 tablet, Rfl: 0 .  losartan (COZAAR) 25 MG tablet, Take 0.5 tablets (12.5 mg total) by mouth daily., Disp: 45 tablet, Rfl: 1 .  meloxicam (MOBIC) 7.5 MG tablet, Take 7.5 mg by mouth daily., Disp: , Rfl:  .  metoprolol succinate (TOPROL-XL) 25 MG 24 hr tablet, Take 1 tablet (25 mg total) by mouth daily., Disp: 45 tablet, Rfl: 1 .  omeprazole (PRILOSEC) 20 MG capsule,  Take 20 mg by mouth daily., Disp: , Rfl:  .  simvastatin (ZOCOR) 40 MG tablet, Take 1 tablet (40 mg total) by mouth at bedtime., Disp: 90 tablet, Rfl: 1 .  spironolactone (ALDACTONE) 25 MG tablet, Take 0.5 tablets (12.5 mg total) by mouth daily., Disp: 45 tablet, Rfl: 1  Past Medical History: Past Medical History:  Diagnosis Date  . Cerebral aneurysm   . Chronic pain   . Degenerative joint disease   . Hypercholesteremia   . Stroke (HCC)   . TMJ (dislocation of temporomandibular joint)     Tobacco Use: Social History   Tobacco Use  Smoking Status Never Smoker  Smokeless Tobacco Never Used    Labs: Recent Review Flowsheet Data    Labs for ITP Cardiac and Pulmonary Rehab Latest Ref Rng & Units 02/25/2020   Cholestrol 0 - 200 mg/dL 181   LDLCALC 0 - 99 mg/dL 123(H)   HDL >40 mg/dL 45   Trlycerides <150 mg/dL 64   Hemoglobin A1c 4.8 - 5.6 % 4.9       Exercise Target Goals: Exercise Program Goal: Individual exercise prescription set using results from initial 6 min walk test and THRR while considering  patient's activity barriers and safety.   Exercise Prescription Goal: Initial exercise prescription builds to 30-45 minutes a day of aerobic activity, 2-3 days per week.  Home exercise guidelines will be given to patient during program as   part of exercise prescription that the participant will acknowledge.   Education: Aerobic Exercise & Resistance Training: - Gives group verbal and written instruction on the various components of exercise. Focuses on aerobic and resistive training programs and the benefits of this training and how to safely progress through these programs..   Education: Exercise & Equipment Safety: - Individual verbal instruction and demonstration of equipment use and safety with use of the equipment.   Cardiac Rehab from 04/24/2020 in [facility] Cardiac and Pulmonary Rehab  Date  04/24/20  Educator  [facility]  Instruction Review Code  1- Verbalizes Understanding       Education: Exercise Physiology & General Exercise Guidelines: - Group verbal and written instruction with models to review the exercise physiology of the cardiovascular system and associated critical values. Provides general exercise guidelines with specific guidelines to those with heart or lung disease.    Education: Flexibility, Balance, Mind/Body Relaxation: Provides group verbal/written instruction on the benefits of flexibility and balance training, including mind/body exercise modes such as yoga, pilates and tai chi.  Demonstration and skill practice provided.   Activity Barriers & Risk Stratification: Activity Barriers & Cardiac Risk Stratification - 04/24/20 1011      Activity Barriers & Cardiac Risk Stratification   Activity Barriers  Arthritis;Other (comment);Deconditioning;Muscular Weakness;Assistive Device    Comments  Left sided weakness post CVA 1999  2003 (very limited left arm use)    Cardiac Risk Stratification  High       6 Minute Walk: 6 Minute Walk    Row Name 04/24/20 1009         6 Minute Walk   Phase  Initial     Distance  574 feet     Walk Time  6 minutes     # of Rest Breaks  0     MPH  1.08     METS  2.25     RPE  9     VO2 Peak  7.86     Symptoms  No     Resting HR  82 bpm     Resting BP  146/70     Resting Oxygen Saturation   99 %     Exercise Oxygen Saturation  during 6 min walk  99 %     Max Ex. HR  108 bpm     Max Ex. BP  146/74     2 Minute Post BP  132/60        Oxygen Initial Assessment:   Oxygen Re-Evaluation:   Oxygen Discharge (Final Oxygen Re-Evaluation):   Initial Exercise Prescription: Initial Exercise Prescription - 04/24/20 1000      Date of Initial Exercise RX and Referring Provider   Date  04/24/20    Referring Provider  Ida Rogue MD      Treadmill   MPH  0.7    Grade  0    Minutes  15    METs  1.5      NuStep   Level  1    SPM  80    Minutes  15    METs  1      Biostep-RELP   Level  1     SPM  50    Minutes  15    METs  1      Prescription Details   Frequency (times per week)  3    Duration  Progress to 30 minutes of continuous aerobic without signs/symptoms of physical distress  Intensity   THRR 40-80% of Max Heartrate  112-143    Ratings of Perceived Exertion  11-13    Perceived Dyspnea  0-4      Progression   Progression  Continue to progress workloads to maintain intensity without signs/symptoms of physical distress.      Resistance Training   Training Prescription  Yes    Weight  3 lb    Reps  10-15       Perform Capillary Blood Glucose checks as needed.  Exercise Prescription Changes: Exercise Prescription Changes    Row Name 04/24/20 1000             Response to Exercise   Blood Pressure (Admit)  146/70       Blood Pressure (Exercise)  146/74       Blood Pressure (Exit)  132/60       Heart Rate (Admit)  82 bpm       Heart Rate (Exercise)  108 bpm       Heart Rate (Exit)  85 bpm       Oxygen Saturation (Admit)  99 %       Oxygen Saturation (Exercise)  99 %       Rating of Perceived Exertion (Exercise)  9       Symptoms  limps from strokes       Comments  walk test results          Exercise Comments:   Exercise Goals and Review: Exercise Goals    Row Name 04/24/20 1016             Exercise Goals   Increase Physical Activity  Yes       Intervention  Provide advice, education, support and counseling about physical activity/exercise needs.;Develop an individualized exercise prescription for aerobic and resistive training based on initial evaluation findings, risk stratification, comorbidities and participant's personal goals.       Expected Outcomes  Short Term: Attend rehab on a regular basis to increase amount of physical activity.;Long Term: Add in home exercise to make exercise part of routine and to increase amount of physical activity.;Long Term: Exercising regularly at least 3-5 days a week.       Increase Strength and  Stamina  Yes       Intervention  Provide advice, education, support and counseling about physical activity/exercise needs.;Develop an individualized exercise prescription for aerobic and resistive training based on initial evaluation findings, risk stratification, comorbidities and participant's personal goals.       Expected Outcomes  Short Term: Increase workloads from initial exercise prescription for resistance, speed, and METs.;Short Term: Perform resistance training exercises routinely during rehab and add in resistance training at home;Long Term: Improve cardiorespiratory fitness, muscular endurance and strength as measured by increased METs and functional capacity (6MWT)       Able to understand and use rate of perceived exertion (RPE) scale  Yes       Intervention  Provide education and explanation on how to use RPE scale       Expected Outcomes  Short Term: Able to use RPE daily in rehab to express subjective intensity level;Long Term:  Able to use RPE to guide intensity level when exercising independently       Able to understand and use Dyspnea scale  Yes       Intervention  Provide education and explanation on how to use Dyspnea scale       Expected Outcomes  Short Term: Able  to use Dyspnea scale daily in rehab to express subjective sense of shortness of breath during exertion;Long Term: Able to use Dyspnea scale to guide intensity level when exercising independently       Knowledge and understanding of Target Heart Rate Range (THRR)  Yes       Intervention  Provide education and explanation of THRR including how the numbers were predicted and where they are located for reference       Expected Outcomes  Short Term: Able to state/look up THRR;Short Term: Able to use daily as guideline for intensity in rehab;Long Term: Able to use THRR to govern intensity when exercising independently       Able to check pulse independently  Yes       Intervention  Provide education and demonstration on how  to check pulse in carotid and radial arteries.;Review the importance of being able to check your own pulse for safety during independent exercise       Expected Outcomes  Short Term: Able to explain why pulse checking is important during independent exercise;Long Term: Able to check pulse independently and accurately       Understanding of Exercise Prescription  Yes       Intervention  Provide education, explanation, and written materials on patient's individual exercise prescription       Expected Outcomes  Short Term: Able to explain program exercise prescription;Long Term: Able to explain home exercise prescription to exercise independently          Exercise Goals Re-Evaluation :   Discharge Exercise Prescription (Final Exercise Prescription Changes): Exercise Prescription Changes - 04/24/20 1000      Response to Exercise   Blood Pressure (Admit)  146/70    Blood Pressure (Exercise)  146/74    Blood Pressure (Exit)  132/60    Heart Rate (Admit)  82 bpm    Heart Rate (Exercise)  108 bpm    Heart Rate (Exit)  85 bpm    Oxygen Saturation (Admit)  99 %    Oxygen Saturation (Exercise)  99 %    Rating of Perceived Exertion (Exercise)  9    Symptoms  limps from strokes    Comments  walk test results       Nutrition:  Target Goals: Understanding of nutrition guidelines, daily intake of sodium '1500mg'$ , cholesterol '200mg'$ , calories 30% from fat and 7% or less from saturated fats, daily to have 5 or more servings of fruits and vegetables.  Education: Controlling Sodium/Reading Food Labels -Group verbal and written material supporting the discussion of sodium use in heart healthy nutrition. Review and explanation with models, verbal and written materials for utilization of the food label.   Education: General Nutrition Guidelines/Fats and Fiber: -Group instruction provided by verbal, written material, models and posters to present the general guidelines for heart healthy nutrition. Gives  an explanation and review of dietary fats and fiber.   Biometrics: Pre Biometrics - 04/24/20 1017      Pre Biometrics   Height  '5\' 6"'$  (1.676 m)    Weight  170 lb 12.8 oz (77.5 kg)    BMI (Calculated)  27.58    Single Leg Stand  26.72 seconds        Nutrition Therapy Plan and Nutrition Goals:   Nutrition Assessments: Nutrition Assessments - 04/24/20 1017      MEDFICTS Scores   Pre Score  78       MEDIFICTS Score Key:          ?  70 Need to make dietary changes          40-70 Heart Healthy Diet         ? 40 Therapeutic Level Cholesterol Diet  Nutrition Goals Re-Evaluation:   Nutrition Goals Discharge (Final Nutrition Goals Re-Evaluation):   Psychosocial: Target Goals: Acknowledge presence or absence of significant depression and/or stress, maximize coping skills, provide positive support system. Participant is able to verbalize types and ability to use techniques and skills needed for reducing stress and depression.   Education: Depression - Provides group verbal and written instruction on the correlation between heart/lung disease and depressed mood, treatment options, and the stigmas associated with seeking treatment.   Education: Sleep Hygiene -Provides group verbal and written instruction about how sleep can affect your health.  Define sleep hygiene, discuss sleep cycles and impact of sleep habits. Review good sleep hygiene tips.     Education: Stress and Anxiety: - Provides group verbal and written instruction about the health risks of elevated stress and causes of high stress.  Discuss the correlation between heart/lung disease and anxiety and treatment options. Review healthy ways to manage with stress and anxiety.    Initial Review & Psychosocial Screening: Initial Psych Review & Screening - 04/23/20 1114      Initial Review   Current issues with  None Identified      Family Dynamics   Good Support System?  Yes   Friends, First cousin checks on her  every day and her church family     Barriers   Psychosocial barriers to participate in program  There are no identifiable barriers or psychosocial needs.;The patient should benefit from training in stress management and relaxation.      Screening Interventions   Interventions  Encouraged to exercise;To provide support and resources with identified psychosocial needs;Provide feedback about the scores to participant    Expected Outcomes  Short Term goal: Utilizing psychosocial counselor, staff and physician to assist with identification of specific Stressors or current issues interfering with healing process. Setting desired goal for each stressor or current issue identified.;Long Term Goal: Stressors or current issues are controlled or eliminated.;Short Term goal: Identification and review with participant of any Quality of Life or Depression concerns found by scoring the questionnaire.;Long Term goal: The participant improves quality of Life and PHQ9 Scores as seen by post scores and/or verbalization of changes       Quality of Life Scores:  Quality of Life - 04/24/20 1017      Quality of Life   Select  Quality of Life      Quality of Life Scores   Health/Function Pre  29.57 %    Socioeconomic Pre  30 %    Psych/Spiritual Pre  30 %    Family Pre  30 %    GLOBAL Pre  26.82 %      Scores of 19 and below usually indicate a poorer quality of life in these areas.  A difference of  2-3 points is a clinically meaningful difference.  A difference of 2-3 points in the total score of the Quality of Life Index has been associated with significant improvement in overall quality of life, self-image, physical symptoms, and general health in studies assessing change in quality of life.  PHQ-9: Recent Review Flowsheet Data    Depression screen [facility] 2/9 04/24/2020 04/24/2020   Decreased Interest 0 2   Down, Depressed, Hopeless 0 1   PHQ - 2 Score 0 3   Altered sleeping  0 2   Tired, decreased energy 1 3    Change in appetite 0 3   Feeling bad or failure about yourself  0 0   Trouble concentrating 0 0   Moving slowly or fidgety/restless 0 0   Suicidal thoughts 0 0   PHQ-9 Score 1 11   Difficult doing work/chores Not difficult at all Somewhat difficult     Interpretation of Total Score  Total Score Depression Severity:  1-4 = Minimal depression, 5-9 = Mild depression, 10-14 = Moderate depression, 15-19 = Moderately severe depression, 20-27 = Severe depression   Psychosocial Evaluation and Intervention: Psychosocial Evaluation - 04/23/20 1126      Psychosocial Evaluation & Interventions   Comments  Wendelyn Breslow has no recognized barriers to attending the program. She lives alone and wants a dog. She has friends and family as her support system. She is eager to start and have something to do every day. She has had 2 strokes in the past and has left sided weakness. She will use a electric wheelchair at times, does not expect to use it for this program. She should do well during her time here.    Expected Outcomes  STG: Wendelyn Breslow will attend all sessions.  LTG: Wendelyn Breslow will be able to maintain the lifestyle changes learned while she attended the program.    Continue Psychosocial Services   Follow up required by staff       Psychosocial Re-Evaluation:   Psychosocial Discharge (Final Psychosocial Re-Evaluation):   Vocational Rehabilitation: Provide vocational rehab assistance to qualifying candidates.   Vocational Rehab Evaluation & Intervention: Vocational Rehab - 04/23/20 1116      Initial Vocational Rehab Evaluation & Intervention   Assessment shows need for Vocational Rehabilitation  No       Education: Education Goals: Education classes will be provided on a variety of topics geared toward better understanding of heart health and risk factor modification. Participant will state understanding/return demonstration of topics presented as noted by education test scores.  Learning  Barriers/Preferences: Learning Barriers/Preferences - 04/23/20 1116      Learning Barriers/Preferences   Learning Barriers  None    Learning Preferences  None       General Cardiac Education Topics:  AED/CPR: - Group verbal and written instruction with the use of models to demonstrate the basic use of the AED with the basic ABC's of resuscitation.   Anatomy & Physiology of the Heart: - Group verbal and written instruction and models provide basic cardiac anatomy and physiology, with the coronary electrical and arterial systems. Review of Valvular disease and Heart Failure   Cardiac Procedures: - Group verbal and written instruction to review commonly prescribed medications for heart disease. Reviews the medication, class of the drug, and side effects. Includes the steps to properly store meds and maintain the prescription regimen. (beta blockers and nitrates)   Cardiac Medications I: - Group verbal and written instruction to review commonly prescribed medications for heart disease. Reviews the medication, class of the drug, and side effects. Includes the steps to properly store meds and maintain the prescription regimen.   Cardiac Medications II: -Group verbal and written instruction to review commonly prescribed medications for heart disease. Reviews the medication, class of the drug, and side effects. (all other drug classes)    Go Sex-Intimacy & Heart Disease, Get SMART - Goal Setting: - Group verbal and written instruction through game format to discuss heart disease and the return to sexual intimacy. Provides group verbal and written  material to discuss and apply goal setting through the application of the S.M.A.R.T. Method.   Other Matters of the Heart: - Provides group verbal, written materials and models to describe Stable Angina and Peripheral Artery. Includes description of the disease process and treatment options available to the cardiac patient.   Infection  Prevention: - Provides verbal and written material to individual with discussion of infection control including proper hand washing and proper equipment cleaning during exercise session.   Cardiac Rehab from 04/24/2020 in [facility] Rehab  Date  04/24/20  Educator  [facility]  Instruction Review Code  1- Verbalizes Understanding      Falls Prevention: - Provides verbal and written material to individual with discussion of falls prevention and safety.   Cardiac Rehab from 04/24/2020 in [facility] Cardiac and Pulmonary Rehab  Date  04/24/20  Educator  [facility]  Instruction Review Code  1- Verbalizes Understanding      Other: -Provides group and verbal instruction on various topics (see comments)   Knowledge Questionnaire Score: Knowledge Questionnaire Score - 04/24/20 1017      Knowledge Questionnaire Score   Pre Score  22/26 Education Focus: MI, NTG, [place], Exercise       Core Components/Risk Factors/Patient Goals at Admission: Personal Goals and Risk Factors at Admission - 04/24/20 1018      Core Components/Risk Factors/Patient Goals on Admission    Weight Management  Yes;Weight Loss    Intervention  Weight Management: Develop a combined nutrition and exercise program designed to reach desired caloric intake, while maintaining appropriate intake of nutrient and fiber, sodium and fats, and appropriate energy expenditure required for the weight goal.;Weight Management: Provide education and appropriate resources to help participant work on and attain dietary goals.    Admit Weight  170 lb 12.8 oz (77.5 kg)    Goal Weight: Short Term  165 lb (74.8 kg)    Goal Weight: Long Term  160 lb (72.6 kg)    Expected Outcomes  Short Term: Continue to assess and modify interventions until short term weight is achieved;Long Term: Adherence to nutrition and physical activity/exercise program aimed toward attainment of established weight goal;Weight Loss: Understanding of general recommendations  for a balanced deficit meal plan, which promotes 1-2 lb weight loss per week and includes a negative energy balance of [phone] kcal/d;Understanding recommendations for meals to include 15-35% energy as protein, 25-35% energy from fat, 35-60% energy from carbohydrates, less than '200mg'$  of dietary cholesterol, 20-35 gm of total fiber daily;Understanding of distribution of calorie intake throughout the day with the consumption of 4-5 meals/snacks    Heart Failure  Yes    Intervention  Provide a combined exercise and nutrition program that is supplemented with education, support and counseling about heart failure. Directed toward relieving symptoms such as shortness of breath, decreased exercise tolerance, and extremity edema.    Expected Outcomes  Improve functional capacity of life;Short term: Attendance in program 2-3 days a week with increased exercise capacity. Reported lower sodium intake. Reported increased fruit and vegetable intake. Reports medication compliance.;Short term: Daily weights obtained and reported for increase. Utilizing diuretic protocols set by physician.;Long term: Adoption of self-care skills and reduction of barriers for early signs and symptoms recognition and intervention leading to self-care maintenance.    Lipids  Yes    Intervention  Provide education and support for participant on nutrition & aerobic/resistive exercise along with prescribed medications to achieve LDL '70mg'$ , HDL >'40mg'$ .    Expected Outcomes  Short Term: Participant  states understanding of desired cholesterol values and is compliant with medications prescribed. Participant is following exercise prescription and nutrition guidelines.;Long Term: Cholesterol controlled with medications as prescribed, with individualized exercise RX and with personalized nutrition plan. Value goals: LDL < '70mg'$ , HDL > 40 mg.       Education:Diabetes - Individual verbal and written instruction to review signs/symptoms of diabetes,  desired ranges of glucose level fasting, after meals and with exercise. Acknowledge that pre and post exercise glucose checks will be done for 3 sessions at entry of program.   Education: Know Your Numbers and Risk Factors: -Group verbal and written instruction about important numbers in your health.  Discussion of what are risk factors and how they play a role in the disease process.  Review of Cholesterol, Blood Pressure, Diabetes, and BMI and the role they play in your overall health.   Core Components/Risk Factors/Patient Goals Review:    Core Components/Risk Factors/Patient Goals at Discharge (Final Review):    ITP Comments: ITP Comments    Row Name 04/23/20 1124 04/24/20 1009         ITP Comments  Virtual Orientation completed today. HAs EP eval and Gym Orientation scheduled for tomorrow am. Documentation of diagnosis can be found in [place] 02/24/2020.  Completed 6MWT and gym orientation.  Initial ITP created and sent for review to Dr. Emily Filbert, Medical Director.         Comments: Initial ITP

## 2020-04-24 NOTE — Patient Instructions (Signed)
Patient Instructions  Patient Details  Name: Hannah Martinez MRN: [id number] Date of Birth: 1956-09-04 Referring Provider:  Minna Merritts, MD  Below are your personal goals for exercise, nutrition, and risk factors. Our goal is to help you stay on track towards obtaining and maintaining these goals. We will be discussing your progress on these goals with you throughout the program.  Initial Exercise Prescription: Initial Exercise Prescription - 04/24/20 1000      Date of Initial Exercise RX and Referring Provider   Date  04/24/20    Referring Provider  Ida Rogue MD      Treadmill   MPH  0.7    Grade  0    Minutes  15    METs  1.5      NuStep   Level  1    SPM  80    Minutes  15    METs  1      Biostep-RELP   Level  1    SPM  50    Minutes  15    METs  1      Prescription Details   Frequency (times per week)  3    Duration  Progress to 30 minutes of continuous aerobic without signs/symptoms of physical distress      Intensity   THRR 40-80% of Max Heartrate  112-143    Ratings of Perceived Exertion  11-13    Perceived Dyspnea  0-4      Progression   Progression  Continue to progress workloads to maintain intensity without signs/symptoms of physical distress.      Resistance Training   Training Prescription  Yes    Weight  3 lb    Reps  10-15       Exercise Goals: Frequency: Be able to perform aerobic exercise two to three times per week in program working toward 2-5 days per week of home exercise.  Intensity: Work with a perceived exertion of 11 (fairly light) - 15 (hard) while following your exercise prescription.  We will make changes to your prescription with you as you progress through the program.   Duration: Be able to do 30 to 45 minutes of continuous aerobic exercise in addition to a 5 minute warm-up and a 5 minute cool-down routine.   Nutrition Goals: Your personal nutrition goals will be established when you do your nutrition analysis with  the dietician.  The following are general nutrition guidelines to follow: Cholesterol < 200mg /day Sodium < 1500mg /day Fiber: Women over 50 yrs - 21 grams per day  Personal Goals: Personal Goals and Risk Factors at Admission - 04/24/20 1018      Core Components/Risk Factors/Patient Goals on Admission    Weight Management  Yes;Weight Loss    Intervention  Weight Management: Develop a combined nutrition and exercise program designed to reach desired caloric intake, while maintaining appropriate intake of nutrient and fiber, sodium and fats, and appropriate energy expenditure required for the weight goal.;Weight Management: Provide education and appropriate resources to help participant work on and attain dietary goals.    Admit Weight  170 lb 12.8 oz (77.5 kg)    Goal Weight: Short Term  165 lb (74.8 kg)    Goal Weight: Long Term  160 lb (72.6 kg)    Expected Outcomes  Short Term: Continue to assess and modify interventions until short term weight is achieved;Long Term: Adherence to nutrition and physical activity/exercise program aimed toward attainment of established weight goal;Weight Loss: Understanding of  general recommendations for a balanced deficit meal plan, which promotes 1-2 lb weight loss per week and includes a negative energy balance of [phone] kcal/d;Understanding recommendations for meals to include 15-35% energy as protein, 25-35% energy from fat, 35-60% energy from carbohydrates, less than 200mg  of dietary cholesterol, 20-35 gm of total fiber daily;Understanding of distribution of calorie intake throughout the day with the consumption of 4-5 meals/snacks    Heart Failure  Yes    Intervention  Provide a combined exercise and nutrition program that is supplemented with education, support and counseling about heart failure. Directed toward relieving symptoms such as shortness of breath, decreased exercise tolerance, and extremity edema.    Expected Outcomes  Improve functional  capacity of life;Short term: Attendance in program 2-3 days a week with increased exercise capacity. Reported lower sodium intake. Reported increased fruit and vegetable intake. Reports medication compliance.;Short term: Daily weights obtained and reported for increase. Utilizing diuretic protocols set by physician.;Long term: Adoption of self-care skills and reduction of barriers for early signs and symptoms recognition and intervention leading to self-care maintenance.    Lipids  Yes    Intervention  Provide education and support for participant on nutrition & aerobic/resistive exercise along with prescribed medications to achieve LDL 70mg , HDL >40mg .    Expected Outcomes  Short Term: Participant states understanding of desired cholesterol values and is compliant with medications prescribed. Participant is following exercise prescription and nutrition guidelines.;Long Term: Cholesterol controlled with medications as prescribed, with individualized exercise RX and with personalized nutrition plan. Value goals: LDL < 70mg , HDL > 40 mg.       Tobacco Use Initial Evaluation: Social History   Tobacco Use  Smoking Status Never Smoker  Smokeless Tobacco Never Used    Exercise Goals and Review: Exercise Goals    Row Name 04/24/20 1016             Exercise Goals   Increase Physical Activity  Yes       Intervention  Provide advice, education, support and counseling about physical activity/exercise needs.;Develop an individualized exercise prescription for aerobic and resistive training based on initial evaluation findings, risk stratification, comorbidities and participant's personal goals.       Expected Outcomes  Short Term: Attend rehab on a regular basis to increase amount of physical activity.;Long Term: Add in home exercise to make exercise part of routine and to increase amount of physical activity.;Long Term: Exercising regularly at least 3-5 days a week.       Increase Strength and  Stamina  Yes       Intervention  Provide advice, education, support and counseling about physical activity/exercise needs.;Develop an individualized exercise prescription for aerobic and resistive training based on initial evaluation findings, risk stratification, comorbidities and participant's personal goals.       Expected Outcomes  Short Term: Increase workloads from initial exercise prescription for resistance, speed, and METs.;Short Term: Perform resistance training exercises routinely during rehab and add in resistance training at home;Long Term: Improve cardiorespiratory fitness, muscular endurance and strength as measured by increased METs and functional capacity ( )       Able to understand and use rate of perceived exertion (RPE) scale  Yes       Intervention  Provide education and explanation on how to use RPE scale       Expected Outcomes  Short Term: Able to use RPE daily in rehab to express subjective intensity level;Long Term:  Able to use RPE to guide intensity level when  exercising independently       Able to understand and use Dyspnea scale  Yes       Intervention  Provide education and explanation on how to use Dyspnea scale       Expected Outcomes  Short Term: Able to use Dyspnea scale daily in rehab to express subjective sense of shortness of breath during exertion;Long Term: Able to use Dyspnea scale to guide intensity level when exercising independently       Knowledge and understanding of Target Heart Rate Range (THRR)  Yes       Intervention  Provide education and explanation of THRR including how the numbers were predicted and where they are located for reference       Expected Outcomes  Short Term: Able to state/look up THRR;Short Term: Able to use daily as guideline for intensity in rehab;Long Term: Able to use THRR to govern intensity when exercising independently       Able to check pulse independently  Yes       Intervention  Provide education and demonstration on how  to check pulse in carotid and radial arteries.;Review the importance of being able to check your own pulse for safety during independent exercise       Expected Outcomes  Short Term: Able to explain why pulse checking is important during independent exercise;Long Term: Able to check pulse independently and accurately       Understanding of Exercise Prescription  Yes       Intervention  Provide education, explanation, and written materials on patient's individual exercise prescription       Expected Outcomes  Short Term: Able to explain program exercise prescription;Long Term: Able to explain home exercise prescription to exercise independently          Copy of goals given to participant.

## 2020-04-28 ENCOUNTER — Encounter: Payer: Medicare Other | Attending: Cardiovascular Disease | Admitting: *Deleted

## 2020-04-28 ENCOUNTER — Other Ambulatory Visit: Payer: Self-pay

## 2020-04-28 DIAGNOSIS — I5042 Chronic combined systolic (congestive) and diastolic (congestive) heart failure: Secondary | ICD-10-CM | POA: Diagnosis present

## 2020-04-28 NOTE — Progress Notes (Signed)
Daily Session Note  Patient Details  Name: Hannah Martinez MRN: [id number] Date of Birth: [birth year] Referring Provider:     Cardiac Rehab from 04/24/2020 in [facility] Cardiac and Pulmonary Rehab  Referring Provider  Ida Rogue MD      Encounter Date: 04/28/2020  Check In: Session Check In - 04/28/20 1513      Check-In   Supervising physician immediately available to respond to emergencies  See telemetry face sheet for immediately available ER MD    Location  [facility]-Cardiac & Pulmonary Rehab    Staff Present  Renita Papa, RN BSN;Joseph [address] [place], [place], ACSM CEP, Exercise Physiologist;Jessica Noble, [place], RCEP, CCRP, CCET    Virtual Visit  No    Medication changes reported      No    Fall or balance concerns reported     No    Warm-up and Cool-down  Performed on first and last piece of equipment    Resistance Training Performed  Yes    VAD Patient?  No    PAD/SET Patient?  No      Pain Assessment   Currently in Pain?  No/denies          Social History   Tobacco Use  Smoking Status Never Smoker  Smokeless Tobacco Never Used    Goals Met:  Independence with exercise equipment Exercise tolerated well No report of cardiac concerns or symptoms Strength training completed today  Goals Unmet:  Not Applicable  Comments: First full day of exercise!  Patient was oriented to gym and equipment including functions, settings, policies, and procedures.  Patient's individual exercise prescription and treatment plan were reviewed.  All starting workloads were established based on the results of the 6 minute walk test done at initial orientation visit.  The plan for exercise progression was also introduced and progression will be customized based on patient's performance and goals.    Dr. Emily Filbert is Medical Director for [place] and [facility].

## 2020-05-05 ENCOUNTER — Encounter: Payer: Medicare Other | Admitting: *Deleted

## 2020-05-05 ENCOUNTER — Other Ambulatory Visit: Payer: Self-pay

## 2020-05-05 DIAGNOSIS — I5042 Chronic combined systolic (congestive) and diastolic (congestive) heart failure: Secondary | ICD-10-CM

## 2020-05-05 NOTE — Progress Notes (Signed)
Daily Session Note  Patient Details  Name: Hannah Martinez MRN: [id number] Date of Birth: 05-20-1956 Referring Provider:     Cardiac Rehab from 04/24/2020 in [facility] Cardiac and Pulmonary Rehab  Referring Provider  Ida Rogue MD      Encounter Date: 05/05/2020  Check In: Session Check In - 05/05/20 1517      Check-In   Supervising physician immediately available to respond to emergencies  See telemetry face sheet for immediately available ER MD    Location  [facility]-Cardiac & Pulmonary Rehab    Staff Present  Renita Papa, RN BSN;Jessica Luan [place], MA, RCEP, CCRP, CCET;Joseph [place], [place], ACSM CEP, Exercise Physiologist    Virtual Visit  No    Medication changes reported      No    Fall or balance concerns reported     No    Warm-up and Cool-down  Performed on first and last piece of equipment    Resistance Training Performed  Yes    VAD Patient?  No    PAD/SET Patient?  No      Pain Assessment   Currently in Pain?  No/denies          Social History   Tobacco Use  Smoking Status Never Smoker  Smokeless Tobacco Never Used    Goals Met:  Independence with exercise equipment Exercise tolerated well No report of cardiac concerns or symptoms Strength training completed today  Goals Unmet:  Not Applicable  Comments: Pt able to follow exercise prescription today without complaint.  Will continue to monitor for progression.    Dr. Emily Filbert is Medical Director for [place] and [facility].

## 2020-05-07 ENCOUNTER — Other Ambulatory Visit: Payer: Self-pay

## 2020-05-07 ENCOUNTER — Encounter: Payer: Medicare Other | Admitting: *Deleted

## 2020-05-07 DIAGNOSIS — I5042 Chronic combined systolic (congestive) and diastolic (congestive) heart failure: Secondary | ICD-10-CM | POA: Diagnosis not present

## 2020-05-07 NOTE — Progress Notes (Signed)
Daily Session Note  Patient Details  Name: Hannah Martinez MRN: [id number] Date of Birth: Apr 19, 1956 Referring Provider:     Cardiac Rehab from 04/24/2020 in [facility] Cardiac and Pulmonary Rehab  Referring Provider  Ida Rogue MD      Encounter Date: 05/07/2020  Check In: Session Check In - 05/07/20 1513      Check-In   Supervising physician immediately available to respond to emergencies  See telemetry face sheet for immediately available ER MD    Location  [facility]-Cardiac & Pulmonary Rehab    Staff Present  Alberteen Sam, MA, RCEP, CCRP, CCET;Amanda Sommer, BA, ACSM CEP, Exercise Physiologist;Melissa Caiola RDN, LDN;Petrona Wyeth Sherryll Burger, RN BSN    Virtual Visit  No    Medication changes reported      No    Fall or balance concerns reported     No    Warm-up and Cool-down  Performed on first and last piece of equipment    Resistance Training Performed  Yes    VAD Patient?  No    PAD/SET Patient?  No      Pain Assessment   Currently in Pain?  No/denies          Social History   Tobacco Use  Smoking Status Never Smoker  Smokeless Tobacco Never Used    Goals Met:  Independence with exercise equipment Exercise tolerated well No report of cardiac concerns or symptoms Strength training completed today  Goals Unmet:  Not Applicable  Comments: Pt able to follow exercise prescription today without complaint.  Will continue to monitor for progression.    Dr. Emily Filbert is Medical Director for [place] and [facility].

## 2020-05-08 ENCOUNTER — Other Ambulatory Visit: Payer: Self-pay

## 2020-05-08 ENCOUNTER — Encounter: Payer: Medicare Other | Admitting: *Deleted

## 2020-05-08 DIAGNOSIS — I5042 Chronic combined systolic (congestive) and diastolic (congestive) heart failure: Secondary | ICD-10-CM | POA: Diagnosis not present

## 2020-05-08 NOTE — Progress Notes (Signed)
Daily Session Note  Patient Details  Name: Hannah Martinez MRN: [id number] Date of Birth: 1956/08/25 Referring Provider:     Cardiac Rehab from 04/24/2020 in [facility] Cardiac and Pulmonary Rehab  Referring Provider  Ida Rogue MD      Encounter Date: 05/08/2020  Check In: Session Check In - 05/08/20 1501      Check-In   Supervising physician immediately available to respond to emergencies  See telemetry face sheet for immediately available ER MD    Location  [facility]-Cardiac & Pulmonary Rehab    Staff Present  Justin Mend RCP,RRT,BSRT;Flem Enderle Sherryll Burger, RN BSN;Melissa Caiola RDN, LDN    Virtual Visit  No    Medication changes reported      No    Fall or balance concerns reported     No    Warm-up and Cool-down  Performed on first and last piece of equipment    Resistance Training Performed  Yes    VAD Patient?  No    PAD/SET Patient?  No      Pain Assessment   Currently in Pain?  No/denies          Social History   Tobacco Use  Smoking Status Never Smoker  Smokeless Tobacco Never Used    Goals Met:  Independence with exercise equipment Exercise tolerated well No report of cardiac concerns or symptoms Strength training completed today  Goals Unmet:  Not Applicable  Comments: Pt able to follow exercise prescription today without complaint.  Will continue to monitor for progression.    Dr. Emily Filbert is Medical Director for [place] and [facility].

## 2020-05-14 ENCOUNTER — Encounter: Payer: Self-pay | Admitting: *Deleted

## 2020-05-14 DIAGNOSIS — I5042 Chronic combined systolic (congestive) and diastolic (congestive) heart failure: Secondary | ICD-10-CM

## 2020-05-14 NOTE — Progress Notes (Signed)
Cardiac Individual Treatment Plan  Patient Details  Name: Hannah Martinez MRN: [id number] Date of Birth: 01/01/1956 Referring Provider:     Cardiac Rehab from 04/24/2020 in [facility]  Referring Provider  Gollan, Timothy MD      Initial Encounter Date:    Cardiac Rehab from 04/24/2020 in [facility]  Date  04/24/20      Visit Diagnosis: Heart failure, systolic and diastolic, chronic (HCC)  Patient's Home Medications on Admission:  Current Outpatient Medications:  .  aspirin 81 MG chewable tablet, Chew 1 tablet (81 mg total) by mouth daily., Disp: 90 tablet, Rfl: 0 .  benzonatate (TESSALON PERLES) 100 MG capsule, Take 1 capsule (100 mg total) by mouth every 6 (six) hours as needed for cough., Disp: 30 capsule, Rfl: 0 .  fluticasone (FLONASE) 50 MCG/ACT nasal spray, Place 1-2 sprays into both nostrils daily as needed for allergies., Disp: , Rfl:  .  furosemide (LASIX) 40 MG tablet, Take 1 tablet (40 mg total) by mouth 2 (two) times daily., Disp: 180 tablet, Rfl: 1 .  gabapentin (NEURONTIN) 300 MG capsule, Take 300 mg by mouth 3 (three) times daily. (take each capsule with 600mg tablet to equal 900mg three times a day), Disp: , Rfl:  .  gabapentin (NEURONTIN) 600 MG tablet, Take 600 mg by mouth 3 (three) times daily. (take each tablet with 300mg capsule to equal 900mg three times a day), Disp: , Rfl:  .  HYDROcodone-acetaminophen (NORCO/VICODIN) 5-325 MG tablet, Take 1 tablet by mouth every 6 (six) hours as needed for moderate pain. (Patient not taking: Reported on 04/23/2020), Disp: 10 tablet, Rfl: 0 .  losartan (COZAAR) 25 MG tablet, Take 0.5 tablets (12.5 mg total) by mouth daily., Disp: 45 tablet, Rfl: 1 .  meloxicam (MOBIC) 7.5 MG tablet, Take 7.5 mg by mouth daily., Disp: , Rfl:  .  metoprolol succinate (TOPROL-XL) 25 MG 24 hr tablet, Take 1 tablet (25 mg total) by mouth daily., Disp: 45 tablet, Rfl: 1 .  omeprazole (PRILOSEC) 20 MG capsule,  Take 20 mg by mouth daily., Disp: , Rfl:  .  simvastatin (ZOCOR) 40 MG tablet, Take 1 tablet (40 mg total) by mouth at bedtime., Disp: 90 tablet, Rfl: 1 .  spironolactone (ALDACTONE) 25 MG tablet, Take 0.5 tablets (12.5 mg total) by mouth daily., Disp: 45 tablet, Rfl: 1  Past Medical History: Past Medical History:  Diagnosis Date  . Cerebral aneurysm   . Chronic pain   . Degenerative joint disease   . Hypercholesteremia   . Stroke (HCC)   . TMJ (dislocation of temporomandibular joint)     Tobacco Use: Social History   Tobacco Use  Smoking Status Never Smoker  Smokeless Tobacco Never Used    Labs: Recent Review Flowsheet Data    Labs for ITP Cardiac and Pulmonary Rehab Latest Ref Rng & Units 02/25/2020   Cholestrol 0 - 200 mg/dL 181   LDLCALC 0 - 99 mg/dL 123(H)   HDL >40 mg/dL 45   Trlycerides <150 mg/dL 64   Hemoglobin A1c 4.8 - 5.6 % 4.9       Exercise Target Goals: Exercise Program Goal: Individual exercise prescription set using results from initial 6 min walk test and THRR while considering  patient's activity barriers and safety.   Exercise Prescription Goal: Initial exercise prescription builds to 30-45 minutes a day of aerobic activity, 2-3 days per week.  Home exercise guidelines will be given to patient during program as   part of exercise prescription that the participant will acknowledge.   Education: Aerobic Exercise & Resistance Training: - Gives group verbal and written instruction on the various components of exercise. Focuses on aerobic and resistive training programs and the benefits of this training and how to safely progress through these programs..   Education: Exercise & Equipment Safety: - Individual verbal instruction and demonstration of equipment use and safety with use of the equipment.   Cardiac Rehab from 04/24/2020 in [facility]  Date  04/24/20  Educator  [facility]  Instruction Review Code  1- Verbalizes Understanding       Education: Exercise Physiology & General Exercise Guidelines: - Group verbal and written instruction with models to review the exercise physiology of the cardiovascular system and associated critical values. Provides general exercise guidelines with specific guidelines to those with heart or lung disease.    Education: Flexibility, Balance, Mind/Body Relaxation: Provides group verbal/written instruction on the benefits of flexibility and balance training, including mind/body exercise modes such as yoga, pilates and tai chi.  Demonstration and skill practice provided.   Activity Barriers & Risk Stratification: Activity Barriers & Cardiac Risk Stratification - 04/24/20 1011      Activity Barriers & Cardiac Risk Stratification   Activity Barriers  Arthritis;Other (comment);Deconditioning;Muscular Weakness;Assistive Device    Comments  Left sided weakness post CVA 1999  2003 (very limited left arm use)    Cardiac Risk Stratification  High       6 Minute Walk: 6 Minute Walk    Row Name 04/24/20 1009         6 Minute Walk   Phase  Initial     Distance  574 feet     Walk Time  6 minutes     # of Rest Breaks  0     MPH  1.08     METS  2.25     RPE  9     VO2 Peak  7.86     Symptoms  No     Resting HR  82 bpm     Resting BP  146/70     Resting Oxygen Saturation   99 %     Exercise Oxygen Saturation  during 6 min walk  99 %     Max Ex. HR  108 bpm     Max Ex. BP  146/74     2 Minute Post BP  132/60        Oxygen Initial Assessment:   Oxygen Re-Evaluation:   Oxygen Discharge (Final Oxygen Re-Evaluation):   Initial Exercise Prescription: Initial Exercise Prescription - 04/24/20 1000      Date of Initial Exercise RX and Referring Provider   Date  04/24/20    Referring Provider  Ida Rogue MD      Treadmill   MPH  0.7    Grade  0    Minutes  15    METs  1.5      NuStep   Level  1    SPM  80    Minutes  15    METs  1      Biostep-RELP   Level  1     SPM  50    Minutes  15    METs  1      Prescription Details   Frequency (times per week)  3    Duration  Progress to 30 minutes of continuous aerobic without signs/symptoms of physical distress  Intensity   THRR 40-80% of Max Heartrate  112-143    Ratings of Perceived Exertion  11-13    Perceived Dyspnea  0-4      Progression   Progression  Continue to progress workloads to maintain intensity without signs/symptoms of physical distress.      Resistance Training   Training Prescription  Yes    Weight  3 lb    Reps  10-15       Perform Capillary Blood Glucose checks as needed.  Exercise Prescription Changes: Exercise Prescription Changes    Row Name 04/24/20 1000 05/06/20 1500           Response to Exercise   Blood Pressure (Admit)  146/70  124/70      Blood Pressure (Exercise)  146/74  140/72      Blood Pressure (Exit)  132/60  100/60      Heart Rate (Admit)  82 bpm  66 bpm      Heart Rate (Exercise)  108 bpm  100 bpm      Heart Rate (Exit)  85 bpm  107 bpm      Oxygen Saturation (Admit)  99 %  --      Oxygen Saturation (Exercise)  99 %  --      Rating of Perceived Exertion (Exercise)  9  11      Symptoms  limps from strokes  --      Comments  walk test results  --        NuStep   Level  --  2      SPM  --  80      Minutes  --  15      METs  --  2.1        Biostep-RELP   Level  --  1      SPM  --  50      Minutes  --  15      METs  --  2         Exercise Comments:   Exercise Goals and Review: Exercise Goals    Row Name 04/24/20 1016             Exercise Goals   Increase Physical Activity  Yes       Intervention  Provide advice, education, support and counseling about physical activity/exercise needs.;Develop an individualized exercise prescription for aerobic and resistive training based on initial evaluation findings, risk stratification, comorbidities and participant's personal goals.       Expected Outcomes  Short Term: Attend rehab on  a regular basis to increase amount of physical activity.;Long Term: Add in home exercise to make exercise part of routine and to increase amount of physical activity.;Long Term: Exercising regularly at least 3-5 days a week.       Increase Strength and Stamina  Yes       Intervention  Provide advice, education, support and counseling about physical activity/exercise needs.;Develop an individualized exercise prescription for aerobic and resistive training based on initial evaluation findings, risk stratification, comorbidities and participant's personal goals.       Expected Outcomes  Short Term: Increase workloads from initial exercise prescription for resistance, speed, and METs.;Short Term: Perform resistance training exercises routinely during rehab and add in resistance training at home;Long Term: Improve cardiorespiratory fitness, muscular endurance and strength as measured by increased METs and functional capacity (6MWT)       Able to understand and use rate of perceived   exertion (RPE) scale  Yes       Intervention  Provide education and explanation on how to use RPE scale       Expected Outcomes  Short Term: Able to use RPE daily in rehab to express subjective intensity level;Long Term:  Able to use RPE to guide intensity level when exercising independently       Able to understand and use Dyspnea scale  Yes       Intervention  Provide education and explanation on how to use Dyspnea scale       Expected Outcomes  Short Term: Able to use Dyspnea scale daily in rehab to express subjective sense of shortness of breath during exertion;Long Term: Able to use Dyspnea scale to guide intensity level when exercising independently       Knowledge and understanding of Target Heart Rate Range (THRR)  Yes       Intervention  Provide education and explanation of THRR including how the numbers were predicted and where they are located for reference       Expected Outcomes  Short Term: Able to state/look up  THRR;Short Term: Able to use daily as guideline for intensity in rehab;Long Term: Able to use THRR to govern intensity when exercising independently       Able to check pulse independently  Yes       Intervention  Provide education and demonstration on how to check pulse in carotid and radial arteries.;Review the importance of being able to check your own pulse for safety during independent exercise       Expected Outcomes  Short Term: Able to explain why pulse checking is important during independent exercise;Long Term: Able to check pulse independently and accurately       Understanding of Exercise Prescription  Yes       Intervention  Provide education, explanation, and written materials on patient's individual exercise prescription       Expected Outcomes  Short Term: Able to explain program exercise prescription;Long Term: Able to explain home exercise prescription to exercise independently          Exercise Goals Re-Evaluation : Exercise Goals Re-Evaluation    Row Name 04/28/20 1515 05/06/20 1514           Exercise Goal Re-Evaluation   Exercise Goals Review  Increase Physical Activity;Able to understand and use rate of perceived exertion (RPE) scale;Knowledge and understanding of Target Heart Rate Range (THRR);Understanding of Exercise Prescription;Increase Strength and Stamina;Able to check pulse independently  Increase Physical Activity;Able to understand and use rate of perceived exertion (RPE) scale;Knowledge and understanding of Target Heart Rate Range (THRR);Understanding of Exercise Prescription;Increase Strength and Stamina;Able to check pulse independently      Comments  Reviewed RPE and dyspnea scales, THR and program prescription with pt today.  Pt voiced understanding and was given a copy of goals to take home.  Hannah Martinez has tolerated exercise well.  She was able to walk on TM for a few minutes.  Staff will monito progress.      Expected Outcomes  Short: Use RPE daily to regulate  intensity. Long: Follow program prescription in THR.  Short: attend program session sconsistently Long:  increase stamina         Discharge Exercise Prescription (Final Exercise Prescription Changes): Exercise Prescription Changes - 05/06/20 1500      Response to Exercise   Blood Pressure (Admit)  124/70    Blood Pressure (Exercise)  140/72    Blood Pressure (Exit)  100/60    Heart Rate (Admit)  66 bpm    Heart Rate (Exercise)  100 bpm    Heart Rate (Exit)  107 bpm    Rating of Perceived Exertion (Exercise)  11      NuStep   Level  2    SPM  80    Minutes  15    METs  2.1      Biostep-RELP   Level  1    SPM  50    Minutes  15    METs  2       Nutrition:  Target Goals: Understanding of nutrition guidelines, daily intake of sodium <1587m, cholesterol <2013m calories 30% from fat and 7% or less from saturated fats, daily to have 5 or more servings of fruits and vegetables.  Education: Controlling Sodium/Reading Food Labels -Group verbal and written material supporting the discussion of sodium use in heart healthy nutrition. Review and explanation with models, verbal and written materials for utilization of the food label.   Education: General Nutrition Guidelines/Fats and Fiber: -Group instruction provided by verbal, written material, models and posters to present the general guidelines for heart healthy nutrition. Gives an explanation and review of dietary fats and fiber.   Biometrics: Pre Biometrics - 04/24/20 1017      Pre Biometrics   Height  5' 6" (1.676 m)    Weight  170 lb 12.8 oz (77.5 kg)    BMI (Calculated)  27.58    Single Leg Stand  26.72 seconds        Nutrition Therapy Plan and Nutrition Goals:   Nutrition Assessments: Nutrition Assessments - 04/24/20 1017      MEDFICTS Scores   Pre Score  78       MEDIFICTS Score Key:          ?70 Need to make dietary changes          40-70 Heart Healthy Diet         ? 40 Therapeutic Level Cholesterol  Diet  Nutrition Goals Re-Evaluation:   Nutrition Goals Discharge (Final Nutrition Goals Re-Evaluation):   Psychosocial: Target Goals: Acknowledge presence or absence of significant depression and/or stress, maximize coping skills, provide positive support system. Participant is able to verbalize types and ability to use techniques and skills needed for reducing stress and depression.   Education: Depression - Provides group verbal and written instruction on the correlation between heart/lung disease and depressed mood, treatment options, and the stigmas associated with seeking treatment.   Education: Sleep Hygiene -Provides group verbal and written instruction about how sleep can affect your health.  Define sleep hygiene, discuss sleep cycles and impact of sleep habits. Review good sleep hygiene tips.     Education: Stress and Anxiety: - Provides group verbal and written instruction about the health risks of elevated stress and causes of high stress.  Discuss the correlation between heart/lung disease and anxiety and treatment options. Review healthy ways to manage with stress and anxiety.    Initial Review & Psychosocial Screening: Initial Psych Review & Screening - 04/23/20 1114      Initial Review   Current issues with  None Identified      Family Dynamics   Good Support System?  Yes   Friends, First cousin checks on her every day and her church family     Barriers   Psychosocial barriers to participate in program  There are no identifiable barriers or psychosocial needs.;The patient should benefit from training  in stress management and relaxation.      Screening Interventions   Interventions  Encouraged to exercise;To provide support and resources with identified psychosocial needs;Provide feedback about the scores to participant    Expected Outcomes  Short Term goal: Utilizing psychosocial counselor, staff and physician to assist with identification of specific Stressors  or current issues interfering with healing process. Setting desired goal for each stressor or current issue identified.;Long Term Goal: Stressors or current issues are controlled or eliminated.;Short Term goal: Identification and review with participant of any Quality of Life or Depression concerns found by scoring the questionnaire.;Long Term goal: The participant improves quality of Life and PHQ9 Scores as seen by post scores and/or verbalization of changes       Quality of Life Scores:  Quality of Life - 04/24/20 1017      Quality of Life   Select  Quality of Life      Quality of Life Scores   Health/Function Pre  29.57 %    Socioeconomic Pre  30 %    Psych/Spiritual Pre  30 %    Family Pre  30 %    GLOBAL Pre  26.82 %      Scores of 19 and below usually indicate a poorer quality of life in these areas.  A difference of  2-3 points is a clinically meaningful difference.  A difference of 2-3 points in the total score of the Quality of Life Index has been associated with significant improvement in overall quality of life, self-image, physical symptoms, and general health in studies assessing change in quality of life.  PHQ-9: Recent Review Flowsheet Data    Depression screen [facility] 2/9 04/24/2020 04/24/2020   Decreased Interest 0 2   Down, Depressed, Hopeless 0 1   PHQ - 2 Score 0 3   Altered sleeping 0 2   Tired, decreased energy 1 3   Change in appetite 0 3   Feeling bad or failure about yourself  0 0   Trouble concentrating 0 0   Moving slowly or fidgety/restless 0 0   Suicidal thoughts 0 0   PHQ-9 Score 1 11   Difficult doing work/chores Not difficult at all Somewhat difficult     Interpretation of Total Score  Total Score Depression Severity:  1-4 = Minimal depression, 5-9 = Mild depression, 10-14 = Moderate depression, 15-19 = Moderately severe depression, 20-27 = Severe depression   Psychosocial Evaluation and Intervention: Psychosocial Evaluation - 04/23/20 1126       Psychosocial Evaluation & Interventions   Comments  Hannah Martinez has no recognized barriers to attending the program. She lives alone and wants a dog. She has friends and family as her support system. She is eager to start and have something to do every day. She has had 2 strokes in the past and has left sided weakness. She will use a electric wheelchair at times, does not expect to use it for this program. She should do well during her time here.    Expected Outcomes  STG: Hannah Martinez will attend all sessions.  LTG: Hannah Martinez will be able to maintain the lifestyle changes learned while she attended the program.    Continue Psychosocial Services   Follow up required by staff       Psychosocial Re-Evaluation:   Psychosocial Discharge (Final Psychosocial Re-Evaluation):   Vocational Rehabilitation: Provide vocational rehab assistance to qualifying candidates.   Vocational Rehab Evaluation & Intervention: Vocational Rehab - 04/23/20 1116  Initial Vocational Rehab Evaluation & Intervention   Assessment shows need for Vocational Rehabilitation  No       Education: Education Goals: Education classes will be provided on a variety of topics geared toward better understanding of heart health and risk factor modification. Participant will state understanding/return demonstration of topics presented as noted by education test scores.  Learning Barriers/Preferences: Learning Barriers/Preferences - 04/23/20 1116      Learning Barriers/Preferences   Learning Barriers  None    Learning Preferences  None       General Cardiac Education Topics:  AED/CPR: - Group verbal and written instruction with the use of models to demonstrate the basic use of the AED with the basic ABC's of resuscitation.   Anatomy & Physiology of the Heart: - Group verbal and written instruction and models provide basic cardiac anatomy and physiology, with the coronary electrical and arterial systems. Review of Valvular disease and Heart  Failure   Cardiac Procedures: - Group verbal and written instruction to review commonly prescribed medications for heart disease. Reviews the medication, class of the drug, and side effects. Includes the steps to properly store meds and maintain the prescription regimen. (beta blockers and nitrates)   Cardiac Medications I: - Group verbal and written instruction to review commonly prescribed medications for heart disease. Reviews the medication, class of the drug, and side effects. Includes the steps to properly store meds and maintain the prescription regimen.   Cardiac Medications II: -Group verbal and written instruction to review commonly prescribed medications for heart disease. Reviews the medication, class of the drug, and side effects. (all other drug classes)    Go Sex-Intimacy & Heart Disease, Get SMART - Goal Setting: - Group verbal and written instruction through game format to discuss heart disease and the return to sexual intimacy. Provides group verbal and written material to discuss and apply goal setting through the application of the S.M.A.R.T. Method.   Other Matters of the Heart: - Provides group verbal, written materials and models to describe Stable Angina and Peripheral Artery. Includes description of the disease process and treatment options available to the cardiac patient.   Infection Prevention: - Provides verbal and written material to individual with discussion of infection control including proper hand washing and proper equipment cleaning during exercise session.   Cardiac Rehab from 04/24/2020 in [facility]  Date  04/24/20  Educator  J[facility]  Instruction Review Code  1- Verbalizes Understanding      Falls Prevention: - Provides verbal and written material to individual with discussion of falls prevention and safety.   Cardiac Rehab from 04/24/2020 in [facility]  Date  04/24/20  Educator  JH  Instruction Review  Code  1- Verbalizes Understanding      Other: -Provides group and verbal instruction on various topics (see comments)   Knowledge Questionnaire Score: Knowledge Questionnaire Score - 04/24/20 1017      Knowledge Questionnaire Score   Pre Score  22/26 Education Focus: MI, NTG, [place], Exercise       Core Components/Risk Factors/Patient Goals at Admission: Personal Goals and Risk Factors at Admission - 04/24/20 1018      Core Components/Risk Factors/Patient Goals on Admission    Weight Management  Yes;Weight Loss    Intervention  Weight Management: Develop a combined nutrition and exercise program designed to reach desired caloric intake, while maintaining appropriate intake of nutrient and fiber, sodium and fats, and appropriate energy expenditure required for the weight   goal.;Weight Management: Provide education and appropriate resources to help participant work on and attain dietary goals.    Admit Weight  170 lb 12.8 oz (77.5 kg)    Goal Weight: Short Term  165 lb (74.8 kg)    Goal Weight: Long Term  160 lb (72.6 kg)    Expected Outcomes  Short Term: Continue to assess and modify interventions until short term weight is achieved;Long Term: Adherence to nutrition and physical activity/exercise program aimed toward attainment of established weight goal;Weight Loss: Understanding of general recommendations for a balanced deficit meal plan, which promotes 1-2 lb weight loss per week and includes a negative energy balance of 500-1000 kcal/d;Understanding recommendations for meals to include 15-35% energy as protein, 25-35% energy from fat, 35-60% energy from carbohydrates, less than 200mg of dietary cholesterol, 20-35 gm of total fiber daily;Understanding of distribution of calorie intake throughout the day with the consumption of 4-5 meals/snacks    Heart Failure  Yes    Intervention  Provide a combined exercise and nutrition program that is supplemented with education, support and  counseling about heart failure. Directed toward relieving symptoms such as shortness of breath, decreased exercise tolerance, and extremity edema.    Expected Outcomes  Improve functional capacity of life;Short term: Attendance in program 2-3 days a week with increased exercise capacity. Reported lower sodium intake. Reported increased fruit and vegetable intake. Reports medication compliance.;Short term: Daily weights obtained and reported for increase. Utilizing diuretic protocols set by physician.;Long term: Adoption of self-care skills and reduction of barriers for early signs and symptoms recognition and intervention leading to self-care maintenance.    Lipids  Yes    Intervention  Provide education and support for participant on nutrition & aerobic/resistive exercise along with prescribed medications to achieve LDL <70mg, HDL >40mg.    Expected Outcomes  Short Term: Participant states understanding of desired cholesterol values and is compliant with medications prescribed. Participant is following exercise prescription and nutrition guidelines.;Long Term: Cholesterol controlled with medications as prescribed, with individualized exercise RX and with personalized nutrition plan. Value goals: LDL < 70mg, HDL > 40 mg.       Education:Diabetes - Individual verbal and written instruction to review signs/symptoms of diabetes, desired ranges of glucose level fasting, after meals and with exercise. Acknowledge that pre and post exercise glucose checks will be done for 3 sessions at entry of program.   Education: Know Your Numbers and Risk Factors: -Group verbal and written instruction about important numbers in your health.  Discussion of what are risk factors and how they play a role in the disease process.  Review of Cholesterol, Blood Pressure, Diabetes, and BMI and the role they play in your overall health.   Core Components/Risk Factors/Patient Goals Review:    Core Components/Risk  Factors/Patient Goals at Discharge (Final Review):    ITP Comments: ITP Comments    Row Name 04/23/20 1124 04/24/20 1009 04/28/20 1514 05/14/20 0620     ITP Comments  Virtual Orientation completed today. HAs EP eval and Gym Orientation scheduled for tomorrow am. Documentation of diagnosis can be found in [facility] 02/24/2020.  Completed 6MWT and gym orientation.  Initial ITP created and sent for review to Dr. Mark Miller, Medical Director.  First full day of exercise!  Patient was oriented to gym and equipment including functions, settings, policies, and procedures.  Patient's individual exercise prescription and treatment plan were reviewed.  All starting workloads were established based on the results of the 6 minute walk test done at   initial orientation visit.  The plan for exercise progression was also introduced and progression will be customized based on patient's performance and goals.  30 Day review completed. ITP review done, changes made as directed,and approval shown by signature of  program Medical Director. New to program       Comments:  

## 2020-05-15 ENCOUNTER — Encounter: Payer: Medicare Other | Admitting: *Deleted

## 2020-05-15 ENCOUNTER — Other Ambulatory Visit: Payer: Self-pay

## 2020-05-15 DIAGNOSIS — I5042 Chronic combined systolic (congestive) and diastolic (congestive) heart failure: Secondary | ICD-10-CM | POA: Diagnosis not present

## 2020-05-15 NOTE — Progress Notes (Signed)
Daily Session Note  Patient Details  Name: Hannah Martinez MRN: [id number] Date of Birth: 22-Jun-1956 Referring Provider:     Cardiac Rehab from 04/24/2020 in [facility] Cardiac and Pulmonary Rehab  Referring Provider  Ida Rogue MD      Encounter Date: 05/15/2020  Check In: Session Check In - 05/15/20 1506      Check-In   Supervising physician immediately available to respond to emergencies  See telemetry face sheet for immediately available ER MD    Location  [facility]-Cardiac & Pulmonary Rehab    Staff Present  Renita Papa, RN BSN;Joseph [address] [place], [place], [place], CCRP, CCET    Virtual Visit  No    Medication changes reported      No    Fall or balance concerns reported     No    Warm-up and Cool-down  Performed on first and last piece of equipment    Resistance Training Performed  Yes    VAD Patient?  No    PAD/SET Patient?  No      Pain Assessment   Currently in Pain?  No/denies          Social History   Tobacco Use  Smoking Status Never Smoker  Smokeless Tobacco Never Used    Goals Met:  Independence with exercise equipment Exercise tolerated well No report of cardiac concerns or symptoms Strength training completed today  Goals Unmet:  Not Applicable  Comments: Pt able to follow exercise prescription today without complaint.  Will continue to monitor for progression.    Dr. Emily Filbert is Medical Director for [place] and [facility].

## 2020-05-19 ENCOUNTER — Encounter: Payer: Medicare Other | Admitting: *Deleted

## 2020-05-19 ENCOUNTER — Other Ambulatory Visit: Payer: Self-pay

## 2020-05-19 DIAGNOSIS — I5042 Chronic combined systolic (congestive) and diastolic (congestive) heart failure: Secondary | ICD-10-CM

## 2020-05-19 NOTE — Progress Notes (Signed)
Daily Session Note  Patient Details  Name: Hannah Martinez MRN: [id number] Date of Birth: 02/01/1956 Referring Provider:     Cardiac Rehab from 04/24/2020 in [facility] Cardiac and Pulmonary Rehab  Referring Provider  Ida Rogue MD      Encounter Date: 05/19/2020  Check In: Session Check In - 05/19/20 1507      Check-In   Supervising physician immediately available to respond to emergencies  See telemetry face sheet for immediately available ER MD    Location  [facility]-Cardiac & Pulmonary Rehab    Staff Present  Renita Papa, RN BSN;Joseph [address] [place], [place], ACSM CEP, Exercise Physiologist    Virtual Visit  No    Medication changes reported      No    Fall or balance concerns reported     No    Warm-up and Cool-down  Performed on first and last piece of equipment    Resistance Training Performed  Yes    VAD Patient?  No    PAD/SET Patient?  No      Pain Assessment   Currently in Pain?  No/denies          Social History   Tobacco Use  Smoking Status Never Smoker  Smokeless Tobacco Never Used    Goals Met:  Independence with exercise equipment Exercise tolerated well No report of cardiac concerns or symptoms Strength training completed today  Goals Unmet:  Not Applicable  Comments: Pt able to follow exercise prescription today without complaint.  Will continue to monitor for progression.    Dr. Emily Filbert is Medical Director for [place] and [facility].

## 2020-06-02 ENCOUNTER — Encounter: Payer: Medicare Other | Attending: Cardiovascular Disease

## 2020-06-02 ENCOUNTER — Other Ambulatory Visit: Payer: Self-pay

## 2020-06-02 DIAGNOSIS — I5042 Chronic combined systolic (congestive) and diastolic (congestive) heart failure: Secondary | ICD-10-CM | POA: Insufficient documentation

## 2020-06-02 NOTE — Progress Notes (Signed)
Daily Session Note  Patient Details  Name: Hannah Martinez MRN: [id number] Date of Birth: [birth year] Referring Provider:     Cardiac Rehab from 04/24/2020 in [facility] Cardiac and Pulmonary Rehab  Referring Provider  Ida Rogue MD      Encounter Date: 06/02/2020  Check In: Session Check In - 06/02/20 1517      Check-In   Supervising physician immediately available to respond to emergencies  See telemetry face sheet for immediately available ER MD    Location  [facility]-Cardiac & Pulmonary Rehab    Staff Present  Basilia Jumbo, RN, BSN;Joseph [address] [place], [place], [place], CCRP, [place], [place] Exercise Physiologist;Kelly Amedeo [place], [place], ACSM CEP, Exercise Physiologist;Melissa Caiola RDN, LDN    Virtual Visit  No    Medication changes reported      No    Fall or balance concerns reported     No    Tobacco Cessation  No Change    Warm-up and Cool-down  Performed on first and last piece of equipment    Resistance Training Performed  Yes    VAD Patient?  No    PAD/SET Patient?  No      Pain Assessment   Currently in Pain?  No/denies          Social History   Tobacco Use  Smoking Status Never Smoker  Smokeless Tobacco Never Used    Goals Met:  Independence with exercise equipment Exercise tolerated well No report of cardiac concerns or symptoms  Goals Unmet:  Not Applicable  Comments: Pt able to follow exercise prescription today without complaint.  Will continue to monitor for progression.   Dr. Emily Filbert is Medical Director for [place] and [facility].

## 2020-06-09 ENCOUNTER — Encounter: Payer: Medicare Other | Admitting: *Deleted

## 2020-06-09 ENCOUNTER — Other Ambulatory Visit: Payer: Self-pay

## 2020-06-09 DIAGNOSIS — I5042 Chronic combined systolic (congestive) and diastolic (congestive) heart failure: Secondary | ICD-10-CM

## 2020-06-09 NOTE — Progress Notes (Deleted)
Cardiology Office Note  Date:  06/10/2020   ID:  Hannah Martinez, DOB [birth year], MRN [id number]  PCP:  [facility] [place]   No chief complaint on file.   HPI:  Hannah Martinez is a 64 y.o. female with a hx of  HFrEF,  ejection fraction March 2021 2025% CAD, occluded LAD  ICM, CVA, HLD, GERD,  cerebral aneurysm s/p clipping  presents today for follow up of HFrEF/ICM.  Recent cardiac history reviewed Admitted 02/24/2020-03/25/2020 to [facility] presenting with cough, wheezing, chest pain.   echo 02/25/20 showing LVEF 20-25%, LV mildly dilated, akinesis in the LAD territory, RV normal size and function, mild-moderate MR.    cath 02/25/20 showed chronic occluded osital LAD with right to left and left to left collaterals, mild disease (30%) to L Cx.  RHC with severely elevated filling pressures - moderate pulmonary hypertension, mildly reduced cardiac output.   suspected LAD infarct 2 weeks prior to cath and question whether or not the anterior wall is viable.   Her cerebralaneurysm was repaired in 1999 at [facility]. In 2003 she had progressive left hemiparesis and found to have large right middle cerebral artery aneurysm proximal aneurysmal clip with reoperation at [facility] 04/2002.    lipoma removed from her right frontal bone 09/2584 complicated by MRSA infection.  [facility]:  Hemoglobin 14.2, white blood cell 4.5, platelets 307  Creatinine 0.97, GFR 72, K3.8, CA 9.7, AST 12, ALT 11]  At last visit 03/14/20 she reported feeling   Tolerating losartan metoprolol spironolactone, low doses with Lasix 40 twice daily  EKG personally reviewed by myself on todays visit    PMH:   has a past medical history of Cerebral aneurysm, Chronic pain, Degenerative joint disease, Hypercholesteremia, Stroke ([place]), and TMJ (dislocation of temporomandibular joint).  PSH:    Past Surgical History:  Procedure Laterality Date  . CEREBRAL  ANEURYSM REPAIR    . RIGHT/LEFT HEART CATH AND CORONARY ANGIOGRAPHY N/A 02/25/2020   Procedure: RIGHT/LEFT HEART CATH AND CORONARY ANGIOGRAPHY;  Surgeon: [facility], MD;  Location: [place] [facility];  Service: Cardiovascular;  Laterality: N/A;    Current Outpatient Medications  Medication Sig Dispense Refill  . aspirin 81 MG chewable tablet Chew 1 tablet (81 mg total) by mouth daily. 90 tablet 0  . benzonatate (TESSALON PERLES) 100 MG capsule Take 1 capsule (100 mg total) by mouth every 6 (six) hours as needed for cough. 30 capsule 0  . fluticasone (FLONASE) 50 MCG/ACT nasal spray Place 1-2 sprays into both nostrils daily as needed for allergies.    . furosemide (LASIX) 40 MG tablet Take 1 tablet (40 mg total) by mouth 2 (two) times daily. 180 tablet 1  . gabapentin (NEURONTIN) 300 MG capsule Take 300 mg by mouth 3 (three) times daily. (take each capsule with 600mg  tablet to equal 900mg  three times a day)    . gabapentin (NEURONTIN) 600 MG tablet Take 600 mg by mouth 3 (three) times daily. (take each tablet with 300mg  capsule to equal 900mg  three times a day)    . HYDROcodone-acetaminophen (NORCO/VICODIN) 5-325 MG tablet Take 1 tablet by mouth every 6 (six) hours as needed for moderate pain. (Patient not taking: Reported on 04/23/2020) 10 tablet 0  . losartan (COZAAR) 25 MG tablet Take 0.5 tablets (12.5 mg total) by mouth daily. 45 tablet 1  . meloxicam (MOBIC) 7.5 MG tablet Take 7.5 mg by mouth daily.    . metoprolol succinate (TOPROL-XL) 25 MG  24 hr tablet Take 1 tablet (25 mg total) by mouth daily. 45 tablet 1  . omeprazole (PRILOSEC) 20 MG capsule Take 20 mg by mouth daily.    . simvastatin (ZOCOR) 40 MG tablet Take 1 tablet (40 mg total) by mouth at bedtime. 90 tablet 1  . spironolactone (ALDACTONE) 25 MG tablet Take 0.5 tablets (12.5 mg total) by mouth daily. 45 tablet 1   No current facility-administered medications for this visit.     Allergies:   Lipitor [atorvastatin]    Social History:  The patient  reports that she has never smoked. She has never used smokeless tobacco. She reports that she does not drink alcohol and does not use drugs.   Family History:   family history includes Breast cancer in her cousin.    Review of Systems: Review of Systems  Constitutional: Negative.   HENT: Negative.   Respiratory: Negative.   Cardiovascular: Negative.   Gastrointestinal: Negative.   Musculoskeletal: Negative.   Neurological: Negative.   Psychiatric/Behavioral: Negative.   All other systems reviewed and are negative.    PHYSICAL EXAM: VS:  There were no vitals taken for this visit. , BMI There is no height or weight on file to calculate BMI. GEN: Well nourished, well developed, in no acute distress HEENT: normal Neck: no JVD, carotid bruits, or masses Cardiac: RRR; no murmurs, rubs, or gallops,no edema  Respiratory:  clear to auscultation bilaterally, normal work of breathing GI: soft, nontender, nondistended, + BS MS: no deformity or atrophy Skin: warm and dry, no rash Neuro:  Strength and sensation are intact Psych: euthymic mood, full affect    Recent Labs: 02/27/2020: Hemoglobin 14.5; Magnesium 2.4; Platelets 375 04/11/2020: BUN 15; Creatinine, Ser 0.87; Potassium 4.6; Sodium 141    Lipid Panel Lab Results  Component Value Date   CHOL 181 02/25/2020   HDL 45 02/25/2020   LDLCALC 123 (H) 02/25/2020   TRIG 64 02/25/2020      Wt Readings from Last 3 Encounters:  04/24/20 170 lb 12.8 oz (77.5 kg)  04/11/20 169 lb 4 oz (76.8 kg)  03/14/20 166 lb 4 oz (75.4 kg)       ASSESSMENT AND PLAN:  Problem List Items Addressed This Visit      Cardiology Problems   Stroke [facility])    Other Visit Diagnoses    Coronary artery disease of native artery of native heart with stable angina pectoris (HCC)    -  Primary   Ischemic cardiomyopathy       Chronic combined systolic and diastolic heart failure (HCC)       Pulmonary hypertension,  unspecified (HCC)       Hyperlipidemia LDL goal <70       Nonrheumatic mitral valve regurgitation         Ischemic cardiomyopathy Occluded LAD on catheterization, late presenting MI Initial ejection fraction 20 to 25% On metoprolol losartan spironolactone Lasix, appears euvolemic Consider transition to [facility] Consider repeat echocardiogram  Coronary disease with stable angina Needs repeat lipid panel, orders placed Goal LDL less than 70 Currently on simvastatin 40   Disposition:   F/U  3 months   Total encounter time more than 25 minutes  Greater than 50% was spent in counseling and coordination of care with the patient    Signed, Dossie Arbour, M.D., Ph.D. [facility] [place], [place] [phone]

## 2020-06-09 NOTE — Progress Notes (Signed)
Daily Session Note  Patient Details  Name: Hannah Martinez MRN: [id number] Date of Birth: 1956-06-09 Referring Provider:     Cardiac Rehab from 04/24/2020 in [facility] Cardiac and Pulmonary Rehab  Referring Provider Ida Rogue MD      Encounter Date: 06/09/2020  Check In:  Session Check In - 06/09/20 1458      Check-In   Supervising physician immediately available to respond to emergencies See telemetry face sheet for immediately available ER MD    Location [facility]-Cardiac & Pulmonary Rehab    Staff Present Renita Papa, RN Moises Blood, BS, ACSM CEP, Exercise Physiologist;Jessica Pringle, MA, RCEP, CCRP, CCET;Joseph Hood RCP,RRT,BSRT    Virtual Visit No    Medication changes reported     No    Fall or balance concerns reported    No    Warm-up and Cool-down Performed on first and last piece of equipment    Resistance Training Performed Yes    VAD Patient? No    PAD/SET Patient? No      Pain Assessment   Currently in Pain? No/denies              Social History   Tobacco Use  Smoking Status Never Smoker  Smokeless Tobacco Never Used    Goals Met:  Independence with exercise equipment Exercise tolerated well No report of cardiac concerns or symptoms Strength training completed today  Goals Unmet:  Not Applicable  Comments: Pt able to follow exercise prescription today without complaint.  Will continue to monitor for progression.  Reviewed home exercise with pt today.  Pt plans to walk longer and staff videos for exercise.  Reviewed THR, pulse, RPE, sign and symptoms, pulse oximetery and when to call 911 or MD.  Also discussed weather considerations and indoor options.  Pt voiced understanding.   Dr. Emily Filbert is Medical Director for [place] and [facility].

## 2020-06-10 ENCOUNTER — Ambulatory Visit: Payer: Medicare Other | Admitting: Cardiovascular Disease

## 2020-06-11 ENCOUNTER — Other Ambulatory Visit: Payer: Self-pay

## 2020-06-11 ENCOUNTER — Encounter: Payer: Self-pay | Admitting: *Deleted

## 2020-06-11 ENCOUNTER — Encounter: Payer: Medicare Other | Admitting: *Deleted

## 2020-06-11 DIAGNOSIS — I5042 Chronic combined systolic (congestive) and diastolic (congestive) heart failure: Secondary | ICD-10-CM

## 2020-06-11 NOTE — Progress Notes (Signed)
Cardiac Individual Treatment Plan  Patient Details  Name: Hannah Martinez MRN: [id number] Date of Birth: 11-19-1956 Referring Provider:     Cardiac Rehab from 04/24/2020 in [facility] Cardiac and Pulmonary Rehab  Referring Provider Ida Rogue MD      Initial Encounter Date:    Cardiac Rehab from 04/24/2020 in [facility] Cardiac and Pulmonary Rehab  Date 04/24/20      Visit Diagnosis: Heart failure, systolic and diastolic, chronic ([place])  Patient's Home Medications on Admission:  Current Outpatient Medications:  .  aspirin 81 MG chewable tablet, Chew 1 tablet (81 mg total) by mouth daily., Disp: 90 tablet, Rfl: 0 .  benzonatate (TESSALON PERLES) 100 MG capsule, Take 1 capsule (100 mg total) by mouth every 6 (six) hours as needed for cough., Disp: 30 capsule, Rfl: 0 .  fluticasone (FLONASE) 50 MCG/ACT nasal spray, Place 1-2 sprays into both nostrils daily as needed for allergies., Disp: , Rfl:  .  furosemide (LASIX) 40 MG tablet, Take 1 tablet (40 mg total) by mouth 2 (two) times daily., Disp: 180 tablet, Rfl: 1 .  gabapentin (NEURONTIN) 300 MG capsule, Take 300 mg by mouth 3 (three) times daily. (take each capsule with 620m tablet to equal 9042mthree times a day), Disp: , Rfl:  .  gabapentin (NEURONTIN) 600 MG tablet, Take 600 mg by mouth 3 (three) times daily. (take each tablet with 30056mapsule to equal 900m12mree times a day), Disp: , Rfl:  .  HYDROcodone-acetaminophen (NORCO/VICODIN) 5-325 MG tablet, Take 1 tablet by mouth every 6 (six) hours as needed for moderate pain. (Patient not taking: Reported on 04/23/2020), Disp: 10 tablet, Rfl: 0 .  losartan (COZAAR) 25 MG tablet, Take 0.5 tablets (12.5 mg total) by mouth daily., Disp: 45 tablet, Rfl: 1 .  meloxicam (MOBIC) 7.5 MG tablet, Take 7.5 mg by mouth daily., Disp: , Rfl:  .  metoprolol succinate (TOPROL-XL) 25 MG 24 hr tablet, Take 1 tablet (25 mg total) by mouth daily., Disp: 45 tablet, Rfl: 1 .  omeprazole (PRILOSEC) 20 MG capsule,  Take 20 mg by mouth daily., Disp: , Rfl:  .  simvastatin (ZOCOR) 40 MG tablet, Take 1 tablet (40 mg total) by mouth at bedtime., Disp: 90 tablet, Rfl: 1 .  spironolactone (ALDACTONE) 25 MG tablet, Take 0.5 tablets (12.5 mg total) by mouth daily., Disp: 45 tablet, Rfl: 1  Past Medical History: Past Medical History:  Diagnosis Date  . Cerebral aneurysm   . Chronic pain   . Degenerative joint disease   . Hypercholesteremia   . Stroke (HCC)[place]. TMJ (dislocation of temporomandibular joint)     Tobacco Use: Social History   Tobacco Use  Smoking Status Never Smoker  Smokeless Tobacco Never Used    Labs: Recent Review [employer], physiologicalLabs for ITP Cardiac and Pulmonary Rehab Latest Ref Rng & Units 02/25/2020   Cholestrol 0 - 200 mg/dL 181   LDLCALC 0 - 99 mg/dL 123(H)   HDL >40 mg/dL 45   Trlycerides <150 mg/dL 64   Hemoglobin A1c 4.8 - 5.6 % 4.9       Exercise Target Goals: Exercise Program Goal: Individual exercise prescription set using results from initial 6 min walk test and THRR while considering  patient's activity barriers and safety.   Exercise Prescription Goal: Initial exercise prescription builds to 30-45 minutes a day of aerobic activity, 2-3 days per week.  Home exercise guidelines will be given to patient during program as part of  exercise prescription that the participant will acknowledge.   Education: Aerobic Exercise & Resistance Training: - Gives group verbal and written instruction on the various components of exercise. Focuses on aerobic and resistive training programs and the benefits of this training and how to safely progress through these programs..   Education: Exercise & Equipment Safety: - Individual verbal instruction and demonstration of equipment use and safety with use of the equipment.   Cardiac Rehab from 04/24/2020 in [facility] Cardiac and Pulmonary Rehab  Date 04/24/20  Educator [facility]  Instruction Review Code 1- Verbalizes Understanding       Education: Exercise Physiology & General Exercise Guidelines: - Group verbal and written instruction with models to review the exercise physiology of the cardiovascular system and associated critical values. Provides general exercise guidelines with specific guidelines to those with heart or lung disease.    Education: Flexibility, Balance, Mind/Body Relaxation: Provides group verbal/written instruction on the benefits of flexibility and balance training, including mind/body exercise modes such as yoga, pilates and tai chi.  Demonstration and skill practice provided.   Activity Barriers & Risk Stratification:  Activity Barriers & Cardiac Risk Stratification - 04/24/20 1011      Activity Barriers & Cardiac Risk Stratification   Activity Barriers Arthritis;Other (comment);Deconditioning;Muscular Weakness;Assistive Device    Comments Left sided weakness post CVA 1999  2003 (very limited left arm use)    Cardiac Risk Stratification High           6 Minute Walk:  6 Minute Walk    Row Name 04/24/20 1009         6 Minute Walk   Phase Initial     Distance 574 feet     Walk Time 6 minutes     # of Rest Breaks 0     MPH 1.08     METS 2.25     RPE 9     VO2 Peak 7.86     Symptoms No     Resting HR 82 bpm     Resting BP 146/70     Resting Oxygen Saturation  99 %     Exercise Oxygen Saturation  during 6 min walk 99 %     Max Ex. HR 108 bpm     Max Ex. BP 146/74     2 Minute Post BP 132/60            Oxygen Initial Assessment:   Oxygen Re-Evaluation:   Oxygen Discharge (Final Oxygen Re-Evaluation):   Initial Exercise Prescription:  Initial Exercise Prescription - 04/24/20 1000      Date of Initial Exercise RX and Referring Provider   Date 04/24/20    Referring Provider Ida Rogue MD      Treadmill   MPH 0.7    Grade 0    Minutes 15    METs 1.5      NuStep   Level 1    SPM 80    Minutes 15    METs 1      Biostep-RELP   Level 1    SPM 50     Minutes 15    METs 1      Prescription Details   Frequency (times per week) 3    Duration Progress to 30 minutes of continuous aerobic without signs/symptoms of physical distress      Intensity   THRR 40-80% of Max Heartrate 112-143    Ratings of Perceived Exertion 11-13    Perceived Dyspnea 0-4  Progression   Progression Continue to progress workloads to maintain intensity without signs/symptoms of physical distress.      Resistance Training   Training Prescription Yes    Weight 3 lb    Reps 10-15           Perform Capillary Blood Glucose checks as needed.  Exercise Prescription Changes:  Exercise Prescription Changes    Row Name 04/24/20 1000 05/06/20 1500 05/22/20 1500 06/05/20 1400       Response to Exercise   Blood Pressure (Admit) 146/70 124/70 98/5 90/58    Blood Pressure (Exercise) 146/74 140/72 124/70 110/58    Blood Pressure (Exit) 132/60 100/60 106/58 88/56    Heart Rate (Admit) 82 bpm 66 bpm 76 bpm 75 bpm    Heart Rate (Exercise) 108 bpm 100 bpm 102 bpm 93 bpm    Heart Rate (Exit) 85 bpm 107 bpm 85 bpm 77 bpm    Oxygen Saturation (Admit) 99 % -- -- --    Oxygen Saturation (Exercise) 99 % -- -- --    Rating of Perceived Exertion (Exercise) 9 11 13 11     Symptoms limps from strokes -- none none    Comments walk test results -- -- --    Duration -- -- Continue with 30 min of aerobic exercise without signs/symptoms of physical distress. Continue with 30 min of aerobic exercise without signs/symptoms of physical distress.    Intensity -- -- THRR unchanged THRR unchanged      Progression   Progression -- -- Continue to progress workloads to maintain intensity without signs/symptoms of physical distress. Continue to progress workloads to maintain intensity without signs/symptoms of physical distress.    Average METs -- -- 2.03 2.5      Resistance Training   Training Prescription -- -- Yes Yes    Weight -- -- 3 lb 3 lb    Reps -- -- 10-15 10-15       Interval Training   Interval Training -- -- No No      Treadmill   MPH -- -- 0.7 --    Grade -- -- 0 --    Minutes -- -- 3 --    METs -- -- 1.5 --      NuStep   Level -- 2 3 3     SPM -- 80 -- --    Minutes -- 15 15 15     METs -- 2.1 2.6 2      Biostep-RELP   Level -- 1 3 3     SPM -- 50 -- --    Minutes -- 15 15 15     METs -- 2 2 3            Exercise Comments:   Exercise Goals and Review:  Exercise Goals    Row Name 04/24/20 1016             Exercise Goals   Increase Physical Activity Yes       Intervention Provide advice, education, support and counseling about physical activity/exercise needs.;Develop an individualized exercise prescription for aerobic and resistive training based on initial evaluation findings, risk stratification, comorbidities and participant's personal goals.       Expected Outcomes Short Term: Attend rehab on a regular basis to increase amount of physical activity.;Long Term: Add in home exercise to make exercise part of routine and to increase amount of physical activity.;Long Term: Exercising regularly at least 3-5 days a week.       Increase Strength and Stamina  Yes       Intervention Provide advice, education, support and counseling about physical activity/exercise needs.;Develop an individualized exercise prescription for aerobic and resistive training based on initial evaluation findings, risk stratification, comorbidities and participant's personal goals.       Expected Outcomes Short Term: Increase workloads from initial exercise prescription for resistance, speed, and METs.;Short Term: Perform resistance training exercises routinely during rehab and add in resistance training at home;Long Term: Improve cardiorespiratory fitness, muscular endurance and strength as measured by increased METs and functional capacity (6MWT)       Able to understand and use rate of perceived exertion (RPE) scale Yes       Intervention Provide education and  explanation on how to use RPE scale       Expected Outcomes Short Term: Able to use RPE daily in rehab to express subjective intensity level;Long Term:  Able to use RPE to guide intensity level when exercising independently       Able to understand and use Dyspnea scale Yes       Intervention Provide education and explanation on how to use Dyspnea scale       Expected Outcomes Short Term: Able to use Dyspnea scale daily in rehab to express subjective sense of shortness of breath during exertion;Long Term: Able to use Dyspnea scale to guide intensity level when exercising independently       Knowledge and understanding of Target Heart Rate Range (THRR) Yes       Intervention Provide education and explanation of THRR including how the numbers were predicted and where they are located for reference       Expected Outcomes Short Term: Able to state/look up THRR;Short Term: Able to use daily as guideline for intensity in rehab;Long Term: Able to use THRR to govern intensity when exercising independently       Able to check pulse independently Yes       Intervention Provide education and demonstration on how to check pulse in carotid and radial arteries.;Review the importance of being able to check your own pulse for safety during independent exercise       Expected Outcomes Short Term: Able to explain why pulse checking is important during independent exercise;Long Term: Able to check pulse independently and accurately       Understanding of Exercise Prescription Yes       Intervention Provide education, explanation, and written materials on patient's individual exercise prescription       Expected Outcomes Short Term: Able to explain program exercise prescription;Long Term: Able to explain home exercise prescription to exercise independently              Exercise Goals Re-Evaluation :  Exercise Goals Re-Evaluation    Row Name 04/28/20 1515 05/06/20 1514 05/22/20 1515 06/05/20 1411 06/09/20 1512      Exercise Goal Re-Evaluation   Exercise Goals Review Increase Physical Activity;Able to understand and use rate of perceived exertion (RPE) scale;Knowledge and understanding of Target Heart Rate Range (THRR);Understanding of Exercise Prescription;Increase Strength and Stamina;Able to check pulse independently Increase Physical Activity;Able to understand and use rate of perceived exertion (RPE) scale;Knowledge and understanding of Target Heart Rate Range (THRR);Understanding of Exercise Prescription;Increase Strength and Stamina;Able to check pulse independently Increase Physical Activity;Increase Strength and Stamina;Understanding of Exercise Prescription Increase Physical Activity;Increase Strength and Stamina;Understanding of Exercise Prescription Increase Physical Activity;Increase Strength and Stamina;Understanding of Exercise Prescription   Comments Reviewed RPE and dyspnea scales, THR and program prescription with pt today.  Pt voiced understanding and was given a copy of goals to take home. Hannah Martinez has tolerated exercise well.  She was able to walk on TM for a few minutes.  Staff will monito progress. Hannah Martinez is doing well in rehab.  She is still fearful of the treadmill, and has been averaging around 3 min on there.  She is up to level 3 on BioStep and NuStep.  We will continue to monitor her progress. Hannah Martinez has been doing well in rehab. She has not been on the treadmill recently.  We will encourage moving up workloads.  We will continue to montior her progress. Hannah Martinez is doing well in rehab.  She is walking in her parking lot  at home for about 20 min.  We talked about increasing to 30 min. Reviewed home exercise with pt today.  Pt plans to walk longer and staff videos for exercise.  Reviewed THR, pulse, RPE, sign and symptoms, pulse oximetery and when to call 911 or MD.  Also discussed weather considerations and indoor options.  Pt voiced understanding.   Expected Outcomes Short: Use RPE daily to regulate  intensity. Long: Follow program prescription in THR. Short: attend program session sconsistently Long:  increase stamina Short: return to regular attendance  Long: Continue to improve stamina. Short: Increase workloads Long: Continue to improve stamina. Short: Increase walking time at home Long: Continue to improve stamina.          Discharge Exercise Prescription (Final Exercise Prescription Changes):  Exercise Prescription Changes - 06/05/20 1400      Response to Exercise   Blood Pressure (Admit) 90/58    Blood Pressure (Exercise) 110/58    Blood Pressure (Exit) 88/56    Heart Rate (Admit) 75 bpm    Heart Rate (Exercise) 93 bpm    Heart Rate (Exit) 77 bpm    Rating of Perceived Exertion (Exercise) 11    Symptoms none    Duration Continue with 30 min of aerobic exercise without signs/symptoms of physical distress.    Intensity THRR unchanged      Progression   Progression Continue to progress workloads to maintain intensity without signs/symptoms of physical distress.    Average METs 2.5      Resistance Training   Training Prescription Yes    Weight 3 lb    Reps 10-15      Interval Training   Interval Training No      NuStep   Level 3    Minutes 15    METs 2      Biostep-RELP   Level 3    Minutes 15    METs 3           Nutrition:  Target Goals: Understanding of nutrition guidelines, daily intake of sodium <1560m, cholesterol <2047m calories 30% from fat and 7% or less from saturated fats, daily to have 5 or more servings of fruits and vegetables.  Education: Controlling Sodium/Reading Food Labels -Group verbal and written material supporting the discussion of sodium use in heart healthy nutrition. Review and explanation with models, verbal and written materials for utilization of the food label.   Education: General Nutrition Guidelines/Fats and Fiber: -Group instruction provided by verbal, written material, models and posters to present the general  guidelines for heart healthy nutrition. Gives an explanation and review of dietary fats and fiber.   Biometrics:  Pre Biometrics - 04/24/20 1017      Pre Biometrics   Height 5' 6"  (1.676 m)    Weight  170 lb 12.8 oz (77.5 kg)    BMI (Calculated) 27.58    Single Leg Stand 26.72 seconds            Nutrition Therapy Plan and Nutrition Goals:   Nutrition Assessments:  Nutrition Assessments - 04/24/20 1017      MEDFICTS Scores   Pre Score 78           MEDIFICTS Score Key:          ?70 Need to make dietary changes          40-70 Heart Healthy Diet         ? 40 Therapeutic Level Cholesterol Diet  Nutrition Goals Re-Evaluation:  Nutrition Goals Re-Evaluation    [place] Name 06/09/20 1523             Goals   Nutrition Goal Work on more nutrious snacks and food.       Comment Hannah Martinez has already started to work on her diet. She is working on Engineer, maintenance.       Expected Outcome Short: Continue to work on diet  Long: Continue to follow heart healthy diet.              Nutrition Goals Discharge (Final Nutrition Goals Re-Evaluation):  Nutrition Goals Re-Evaluation - 06/09/20 1523      Goals   Nutrition Goal Work on more nutrious snacks and food.    Comment Hannah Martinez has already started to work on her diet. She is working on Engineer, maintenance.    Expected Outcome Short: Continue to work on diet  Long: Continue to follow heart healthy diet.           Psychosocial: Target Goals: Acknowledge presence or absence of significant depression and/or stress, maximize coping skills, provide positive support system. Participant is able to verbalize types and ability to use techniques and skills needed for reducing stress and depression.   Education: Depression - Provides group verbal and written instruction on the correlation between heart/lung disease and depressed mood, treatment options, and the stigmas associated with seeking treatment.   Education: Sleep Hygiene -Provides  group verbal and written instruction about how sleep can affect your health.  Define sleep hygiene, discuss sleep cycles and impact of sleep habits. Review good sleep hygiene tips.     Education: Stress and Anxiety: - Provides group verbal and written instruction about the health risks of elevated stress and causes of high stress.  Discuss the correlation between heart/lung disease and anxiety and treatment options. Review healthy ways to manage with stress and anxiety.    Initial Review & Psychosocial Screening:  Initial Psych Review & Screening - 04/23/20 1114      Initial Review   Current issues with None Identified      Family Dynamics   Good Support System? Yes   Friends, First cousin checks on her every day and her church family     Barriers   Psychosocial barriers to participate in program There are no identifiable barriers or psychosocial needs.;The patient should benefit from training in stress management and relaxation.      Screening Interventions   Interventions Encouraged to exercise;To provide support and resources with identified psychosocial needs;Provide feedback about the scores to participant    Expected Outcomes Short Term goal: Utilizing psychosocial counselor, staff and physician to assist with identification of specific Stressors or current issues interfering with healing process. Setting desired goal for each stressor or current issue identified.;Long Term Goal: Stressors or current issues  are controlled or eliminated.;Short Term goal: Identification and review with participant of any Quality of Life or Depression concerns found by scoring the questionnaire.;Long Term goal: The participant improves quality of Life and PHQ9 Scores as seen by post scores and/or verbalization of changes           Quality of Life Scores:   Quality of Life - 04/24/20 1017      Quality of Life   Select Quality of Life      Quality of Life Scores   Health/Function Pre 29.57 %     Socioeconomic Pre 30 %    Psych/Spiritual Pre 30 %    Family Pre 30 %    GLOBAL Pre 26.82 %          Scores of 19 and below usually indicate a poorer quality of life in these areas.  A difference of  2-3 points is a clinically meaningful difference.  A difference of 2-3 points in the total score of the Quality of Life Index has been associated with significant improvement in overall quality of life, self-image, physical symptoms, and general health in studies assessing change in quality of life.  PHQ-9: Recent Review Flowsheet Data    Depression screen [facility] 2/9 04/24/2020 04/24/2020   Decreased Interest 0 2   Down, Depressed, Hopeless 0 1   PHQ - 2 Score 0 3   Altered sleeping 0 2   Tired, decreased energy 1 3   Change in appetite 0 3   Feeling bad or failure about yourself  0 0   Trouble concentrating 0 0   Moving slowly or fidgety/restless 0 0   Suicidal thoughts 0 0   PHQ-9 Score 1 11   Difficult doing work/chores Not difficult at all Somewhat difficult     Interpretation of Total Score  Total Score Depression Severity:  1-4 = Minimal depression, 5-9 = Mild depression, 10-14 = Moderate depression, 15-19 = Moderately severe depression, 20-27 = Severe depression   Psychosocial Evaluation and Intervention:  Psychosocial Evaluation - 04/23/20 1126      Psychosocial Evaluation & Interventions   Comments Hannah Martinez has no recognized barriers to attending the program. She lives alone and wants a dog. She has friends and family as her support system. She is eager to start and have something to do every day. She has had 2 strokes in the past and has left sided weakness. She will use a electric wheelchair at times, does not expect to use it for this program. She should do well during her time here.    Expected Outcomes STG: Hannah Martinez will attend all sessions.  LTG: Hannah Martinez will be able to maintain the lifestyle changes learned while she attended the program.    Continue Psychosocial Services  Follow up  required by staff           Psychosocial Re-Evaluation:  Psychosocial Re-Evaluation    [place] Name 06/09/20 1515             Psychosocial Re-Evaluation   Current issues with Current Stress Concerns;Current Sleep Concerns       Comments Hannah Martinez is doing well mentally.  She was getting frustrated with transportation, but she thinks she finally has it straight and will talk to them about her July and August schedule this week.  She continues to cope with her limits from her stroke.  She does not sleep well, and she plans to talk to doctor about sleep.   We talked about watching sleep videos.  Expected Outcomes Short: watch sleep videos and talk to doctor about sleep  Long: Continue to stay positive.              Psychosocial Discharge (Final Psychosocial Re-Evaluation):  Psychosocial Re-Evaluation - 06/09/20 1515      Psychosocial Re-Evaluation   Current issues with Current Stress Concerns;Current Sleep Concerns    Comments Hannah Martinez is doing well mentally.  She was getting frustrated with transportation, but she thinks she finally has it straight and will talk to them about her July and August schedule this week.  She continues to cope with her limits from her stroke.  She does not sleep well, and she plans to talk to doctor about sleep.   We talked about watching sleep videos.    Expected Outcomes Short: watch sleep videos and talk to doctor about sleep  Long: Continue to stay positive.           Vocational Rehabilitation: Provide vocational rehab assistance to qualifying candidates.   Vocational Rehab Evaluation & Intervention:  Vocational Rehab - 04/23/20 1116      Initial Vocational Rehab Evaluation & Intervention   Assessment shows need for Vocational Rehabilitation No           Education: Education Goals: Education classes will be provided on a variety of topics geared toward better understanding of heart health and risk factor modification. Participant will state  understanding/return demonstration of topics presented as noted by education test scores.  Learning Barriers/Preferences:  Learning Barriers/Preferences - 04/23/20 1116      Learning Barriers/Preferences   Learning Barriers None    Learning Preferences None           General Cardiac Education Topics:  AED/CPR: - Group verbal and written instruction with the use of models to demonstrate the basic use of the AED with the basic ABC's of resuscitation.   Anatomy & Physiology of the Heart: - Group verbal and written instruction and models provide basic cardiac anatomy and physiology, with the coronary electrical and arterial systems. Review of Valvular disease and Heart Failure   Cardiac Procedures: - Group verbal and written instruction to review commonly prescribed medications for heart disease. Reviews the medication, class of the drug, and side effects. Includes the steps to properly store meds and maintain the prescription regimen. (beta blockers and nitrates)   Cardiac Medications I: - Group verbal and written instruction to review commonly prescribed medications for heart disease. Reviews the medication, class of the drug, and side effects. Includes the steps to properly store meds and maintain the prescription regimen.   Cardiac Medications II: -Group verbal and written instruction to review commonly prescribed medications for heart disease. Reviews the medication, class of the drug, and side effects. (all other drug classes)    Go Sex-Intimacy & Heart Disease, Get SMART - Goal Setting: - Group verbal and written instruction through game format to discuss heart disease and the return to sexual intimacy. Provides group verbal and written material to discuss and apply goal setting through the application of the S.M.A.R.T. Method.   Other Matters of the Heart: - Provides group verbal, written materials and models to describe Stable Angina and Peripheral Artery. Includes  description of the disease process and treatment options available to the cardiac patient.   Infection Prevention: - Provides verbal and written material to individual with discussion of infection control including proper hand washing and proper equipment cleaning during exercise session.   Cardiac Rehab from 04/24/2020 in [facility] Cardiac and Pulmonary  Rehab  Date 04/24/20  Educator [facility]  Instruction Review Code 1- Verbalizes Understanding      Falls Prevention: - Provides verbal and written material to individual with discussion of falls prevention and safety.   Cardiac Rehab from 04/24/2020 in [facility] Cardiac and Pulmonary Rehab  Date 04/24/20  Educator [facility]  Instruction Review Code 1- Verbalizes Understanding      Other: -Provides group and verbal instruction on various topics (see comments)   Knowledge Questionnaire Score:  Knowledge Questionnaire Score - 04/24/20 1017      Knowledge Questionnaire Score   Pre Score 22/26 Education Focus: MI, NTG, [place], Exercise           Core Components/Risk Factors/Patient Goals at Admission:  Personal Goals and Risk Factors at Admission - 04/24/20 1018      Core Components/Risk Factors/Patient Goals on Admission    Weight Management Yes;Weight Loss    Intervention Weight Management: Develop a combined nutrition and exercise program designed to reach desired caloric intake, while maintaining appropriate intake of nutrient and fiber, sodium and fats, and appropriate energy expenditure required for the weight goal.;Weight Management: Provide education and appropriate resources to help participant work on and attain dietary goals.    Admit Weight 170 lb 12.8 oz (77.5 kg)    Goal Weight: Short Term 165 lb (74.8 kg)    Goal Weight: Long Term 160 lb (72.6 kg)    Expected Outcomes Short Term: Continue to assess and modify interventions until short term weight is achieved;Long Term: Adherence to nutrition and physical activity/exercise program aimed  toward attainment of established weight goal;Weight Loss: Understanding of general recommendations for a balanced deficit meal plan, which promotes 1-2 lb weight loss per week and includes a negative energy balance of [phone] kcal/d;Understanding recommendations for meals to include 15-35% energy as protein, 25-35% energy from fat, 35-60% energy from carbohydrates, less than 242m of dietary cholesterol, 20-35 gm of total fiber daily;Understanding of distribution of calorie intake throughout the day with the consumption of 4-5 meals/snacks    Heart Failure Yes    Intervention Provide a combined exercise and nutrition program that is supplemented with education, support and counseling about heart failure. Directed toward relieving symptoms such as shortness of breath, decreased exercise tolerance, and extremity edema.    Expected Outcomes Improve functional capacity of life;Short term: Attendance in program 2-3 days a week with increased exercise capacity. Reported lower sodium intake. Reported increased fruit and vegetable intake. Reports medication compliance.;Short term: Daily weights obtained and reported for increase. Utilizing diuretic protocols set by physician.;Long term: Adoption of self-care skills and reduction of barriers for early signs and symptoms recognition and intervention leading to self-care maintenance.    Lipids Yes    Intervention Provide education and support for participant on nutrition & aerobic/resistive exercise along with prescribed medications to achieve LDL <782m HDL >4043m   Expected Outcomes Short Term: Participant states understanding of desired cholesterol values and is compliant with medications prescribed. Participant is following exercise prescription and nutrition guidelines.;Long Term: Cholesterol controlled with medications as prescribed, with individualized exercise RX and with personalized nutrition plan. Value goals: LDL < 43m83mDL > 40 mg.            Education:Diabetes - Individual verbal and written instruction to review signs/symptoms of diabetes, desired ranges of glucose level fasting, after meals and with exercise. Acknowledge that pre and post exercise glucose checks will be done for 3 sessions at entry of program.   Education: Know Your Numbers  and Risk Factors: -Group verbal and written instruction about important numbers in your health.  Discussion of what are risk factors and how they play a role in the disease process.  Review of Cholesterol, Blood Pressure, Diabetes, and BMI and the role they play in your overall health.   Core Components/Risk Factors/Patient Goals Review:   Goals and Risk Factor Review    Row Name 06/09/20 1520             Core Components/Risk Factors/Patient Goals Review   Personal Goals Review Weight Management/Obesity;Heart Failure;Lipids       Review Hannah Martinez is doing well in rehab.  Her weight is up and down, but she tries not to go over 180 lbs.  She denies heart failure symptoms.  She will occassionally check her pressure at home, but not since coming to class. Overall, she is doing well.       Expected Outcomes Short: Conitnue to work on weight loss Long; Continue to monitor risk factors.              Core Components/Risk Factors/Patient Goals at Discharge (Final Review):   Goals and Risk Factor Review - 06/09/20 1520      Core Components/Risk Factors/Patient Goals Review   Personal Goals Review Weight Management/Obesity;Heart Failure;Lipids    Review Hannah Martinez is doing well in rehab.  Her weight is up and down, but she tries not to go over 180 lbs.  She denies heart failure symptoms.  She will occassionally check her pressure at home, but not since coming to class. Overall, she is doing well.    Expected Outcomes Short: Conitnue to work on weight loss Long; Continue to monitor risk factors.           ITP Comments:  ITP Comments    Row Name 04/23/20 1124 04/24/20 1009 04/28/20 1514 05/14/20  0620     ITP Comments Virtual Orientation completed today. HAs EP eval and Gym Orientation scheduled for tomorrow am. Documentation of diagnosis can be found in [facility] 02/24/2020. Completed 6MWT and gym orientation.  Initial ITP created and sent for review to Dr. Emily Filbert, Medical Director. First full day of exercise!  Patient was oriented to gym and equipment including functions, settings, policies, and procedures.  Patient's individual exercise prescription and treatment plan were reviewed.  All starting workloads were established based on the results of the 6 minute walk test done at initial orientation visit.  The plan for exercise progression was also introduced and progression will be customized based on patient's performance and goals. 30 Day review completed. ITP review done, changes made as directed,and approval shown by signature of  Scientist, research (life sciences). New to program           Comments: 30 Day review completed. Medical Director ITP review done, changes made as directed, and signed approval by Medical Director.

## 2020-06-11 NOTE — Progress Notes (Signed)
Daily Session Note  Patient Details  Name: Anesa Fronek MRN: [id number] Date of Birth: [birth year] Referring Provider:     Cardiac Rehab from 04/24/2020 in [facility] Cardiac and Pulmonary Rehab  Referring Provider Ida Rogue MD      Encounter Date: 06/11/2020  Check In:  Session Check In - 06/11/20 1516      Check-In   Supervising physician immediately available to respond to emergencies See telemetry face sheet for immediately available ER MD    Location [facility]-Cardiac & Pulmonary Rehab    Staff Present Renita Papa, RN BSN;Melissa Caiola RDN, Rowe Pavy, BA, ACSM CEP, Exercise Physiologist    Virtual Visit No    Medication changes reported     No    Fall or balance concerns reported    No    Warm-up and Cool-down Performed on first and last piece of equipment    Resistance Training Performed Yes    VAD Patient? No    PAD/SET Patient? No      Pain Assessment   Currently in Pain? No/denies              Social History   Tobacco Use  Smoking Status Never Smoker  Smokeless Tobacco Never Used    Goals Met:  Independence with exercise equipment Exercise tolerated well No report of cardiac concerns or symptoms Strength training completed today  Goals Unmet:  Not Applicable  Comments: Pt able to follow exercise prescription today without complaint.  Will continue to monitor for progression.    Dr. Emily Filbert is Medical Director for [place] and [facility].

## 2020-06-12 ENCOUNTER — Encounter: Payer: Medicare Other | Admitting: *Deleted

## 2020-06-12 ENCOUNTER — Other Ambulatory Visit: Payer: Self-pay

## 2020-06-12 DIAGNOSIS — I5042 Chronic combined systolic (congestive) and diastolic (congestive) heart failure: Secondary | ICD-10-CM | POA: Diagnosis not present

## 2020-06-12 NOTE — Progress Notes (Signed)
Daily Session Note  Patient Details  Name: Hannah Martinez MRN: [id number] Date of Birth: February 19, 1956 Referring Provider:     Cardiac Rehab from 04/24/2020 in [facility] Cardiac and Pulmonary Rehab  Referring Provider Ida Rogue MD      Encounter Date: 06/12/2020  Check In:  Session Check In - 06/12/20 1459      Check-In   Supervising physician immediately available to respond to emergencies See telemetry face sheet for immediately available ER MD    Location [facility]-Cardiac & Pulmonary Rehab    Staff Present Renita Papa, RN BSN;Joseph [address] [place], [place], RCEP, CCRP, CCET    Virtual Visit No    Medication changes reported     No    Fall or balance concerns reported    No    Warm-up and Cool-down Performed on first and last piece of equipment    Resistance Training Performed Yes    VAD Patient? No    PAD/SET Patient? No      Pain Assessment   Currently in Pain? No/denies              Social History   Tobacco Use  Smoking Status Never Smoker  Smokeless Tobacco Never Used    Goals Met:  Independence with exercise equipment Exercise tolerated well No report of cardiac concerns or symptoms Strength training completed today  Goals Unmet:  Not Applicable  Comments: Pt able to follow exercise prescription today without complaint.  Will continue to monitor for progression.    Dr. Emily Filbert is Medical Director for [place] and [facility].

## 2020-06-16 ENCOUNTER — Encounter: Payer: Medicare Other | Admitting: *Deleted

## 2020-06-16 ENCOUNTER — Other Ambulatory Visit: Payer: Self-pay

## 2020-06-16 DIAGNOSIS — I5042 Chronic combined systolic (congestive) and diastolic (congestive) heart failure: Secondary | ICD-10-CM

## 2020-06-16 NOTE — Progress Notes (Signed)
Daily Session Note  Patient Details  Name: Marye Eagen MRN: [id number] Date of Birth: 1956/09/23 Referring Provider:     Cardiac Rehab from 04/24/2020 in [facility] Cardiac and Pulmonary Rehab  Referring Provider Ida Rogue MD      Encounter Date: 06/16/2020  Check In:  Session Check In - 06/16/20 1512      Check-In   Supervising physician immediately available to respond to emergencies See telemetry face sheet for immediately available ER MD    Location [facility]-Cardiac & Pulmonary Rehab    Staff Present Renita Papa, RN BSN;Joseph [address] [place], [place], ACSM CEP, Exercise Physiologist;Amanda Oletta Darter, [place], ACSM CEP, Exercise Physiologist    Virtual Visit No    Medication changes reported     No    Fall or balance concerns reported    No    Warm-up and Cool-down Performed on first and last piece of equipment    Resistance Training Performed Yes    VAD Patient? No    PAD/SET Patient? No      Pain Assessment   Currently in Pain? No/denies              Social History   Tobacco Use  Smoking Status Never Smoker  Smokeless Tobacco Never Used    Goals Met:  Independence with exercise equipment Exercise tolerated well No report of cardiac concerns or symptoms Strength training completed today  Goals Unmet:  Not Applicable  Comments: Pt able to follow exercise prescription today without complaint.  Will continue to monitor for progression.    Dr. Emily Filbert is Medical Director for [place] and [facility].

## 2020-06-17 ENCOUNTER — Encounter: Payer: Self-pay | Admitting: Family

## 2020-06-17 ENCOUNTER — Other Ambulatory Visit: Payer: Self-pay

## 2020-06-17 ENCOUNTER — Ambulatory Visit (INDEPENDENT_AMBULATORY_CARE_PROVIDER_SITE_OTHER): Payer: Medicare Other | Admitting: Family

## 2020-06-17 VITALS — BP 110/72 | HR 69 | Ht 65.0 in | Wt 179.2 lb

## 2020-06-17 DIAGNOSIS — R059 Cough, unspecified: Secondary | ICD-10-CM

## 2020-06-17 DIAGNOSIS — R05 Cough: Secondary | ICD-10-CM | POA: Diagnosis not present

## 2020-06-17 DIAGNOSIS — E785 Hyperlipidemia, unspecified: Secondary | ICD-10-CM

## 2020-06-17 DIAGNOSIS — I5042 Chronic combined systolic (congestive) and diastolic (congestive) heart failure: Secondary | ICD-10-CM

## 2020-06-17 DIAGNOSIS — I2510 Atherosclerotic heart disease of native coronary artery without angina pectoris: Secondary | ICD-10-CM

## 2020-06-17 DIAGNOSIS — I255 Ischemic cardiomyopathy: Secondary | ICD-10-CM

## 2020-06-17 MED ORDER — METOPROLOL SUCCINATE ER 25 MG PO TB24: 25.0000 mg | ORAL_TABLET | Freq: Every day | ORAL | 1 refills | Status: DC

## 2020-06-17 MED ORDER — LOSARTAN POTASSIUM 25 MG PO TABS: 12.5000 mg | ORAL_TABLET | Freq: Every day | ORAL | 1 refills | Status: DC

## 2020-06-17 MED ORDER — BENZONATATE 100 MG PO CAPS: 100.0000 mg | ORAL_CAPSULE | Freq: Four times a day (QID) | ORAL | 0 refills | Status: AC | PRN

## 2020-06-17 NOTE — Patient Instructions (Addendum)
Medication Instructions:  Your physician has recommended you make the following change in your medication:   CHANGE Lasix:  For Tuesday, Wednesday, Thursday take Lasix 80mg  (two tablets) in the morning  Then return to Lasix 40mg  twice daily. You may take your second dose in the afternoon instead of in the evening.   Refill of your Metoprolol, Losartan, Tessalon were sent to your pharmacy.   If you need a refill of your Gabapentin or pain medication please talk with you primary care provider.   *If you need a refill on your cardiac medications before your next appointment, please call your pharmacy*   Lab Work: Your physician recommends that you return for lab work today: lipid panel, CMP, BNP  If you have labs (blood work) drawn today and your tests are completely normal, you will receive your results only by: MyChart Message (if you have MyChart) OR . A paper copy in the mail If you have any lab test that is abnormal or we need to change your treatment, we will call you to review the results.   Testing/Procedures: Your EKG today looked stable compared to previous! This was a great result.   1- Your physician has requested that you have an echocardiogram. Echocardiography is a painless test that uses sound waves to create images of your heart. It provides your doctor with information about the size and shape of your heart and how well your heart's chambers and valves are working. This procedure takes approximately one hour. There are no restrictions for this procedure.  Follow-Up: At [facility], you and your health needs are our priority.  As part of our continuing mission to provide you with exceptional heart care, we have created designated Provider Care Teams.  These Care Teams include your primary Cardiologist (physician) and Advanced Practice Providers (APPs -  Physician Assistants and Nurse Practitioners) who all work together to provide you with the care you need, when you  need it.  We recommend signing up for the patient portal called "MyChart".  Sign up information is provided on this After Visit Summary.  MyChart is used to connect with patients for Virtual Visits (Telemedicine).  Patients are able to view lab/test results, encounter notes, upcoming appointments, etc.  Non-urgent messages can be sent to your provider as well.   To learn more about what you can do with MyChart, go to [employer] [employer].    Your next appointment:  3-4 months with Dr [facility] preferred  If you gain 3lbs overnight or 5lbs in one week, please call [email or web address].

## 2020-06-17 NOTE — Progress Notes (Signed)
Office Visit    Patient Name: Hannah Martinez Date of Encounter: 06/17/2020  Primary Care Provider:  [facility] Cardiologist:  Ida Rogue, MD Electrophysiologist:  None   Chief Complaint    Hannah Martinez is a 64 y.o. female with a hx of HFrEF, CAD, ICM, CVA, HLD, GERD, cerebral aneurysm s/p clipping presents today for follow up of HFrEF/ICM.  Past Medical History    Past Medical History:  Diagnosis Date  . Cerebral aneurysm   . Chronic pain   . Degenerative joint disease   . Hypercholesteremia   . Stroke ([place])   . TMJ (dislocation of temporomandibular joint)    Past Surgical History:  Procedure Laterality Date  . CEREBRAL ANEURYSM REPAIR    . RIGHT/LEFT HEART CATH AND CORONARY ANGIOGRAPHY N/A 02/25/2020   Procedure: RIGHT/LEFT HEART CATH AND CORONARY ANGIOGRAPHY;  Surgeon: Wellington Hampshire, MD;  Location: [facility];  Service: Cardiovascular;  Laterality: N/A;    Allergies  Allergies  Allergen Reactions  . Lipitor [Atorvastatin] Hives    History of Present Illness    Hannah Martinez is a 64 y.o. female with a hx of CAD, HFrEF/ICM, CVA, HLD, GERD, cerebral aneurysm s/p clipping last seen 03/14/20.  Her cerebral aneurysm was repaired in 1999 at [facility].  In 2003 she had progressive left hemiparesis and found to have large right middle cerebral artery aneurysm proximal aneurysmal clip with reoperation at [facility] 04/2002.  She had a lipoma removed from her right frontal bone 67/6720 complicated by MRSA infection.   Admitted 02/24/2020-03/25/2020 to [facility] after presenting with cough, wheezing, chest pain. Underwent echo 02/25/20 showing LVEF 20-25%, LV mildly dilated, akinesis in the LAD territory, RV normal size and function, mild-moderate MR. Underwent left and right heart cath 02/25/20 showed chronic occluded osital LAD with right to left and left to left collaterals, mild disease (30%) to L Cx. RHC with severely elevated filling  pressures - moderate pulmonary hypertension, mildly reduced cardiac output. Per Dr. Tyrell Antonio cath report suspect LAD infarct 2 weeks prior to cath and question whether or not the anterior wall is viable. Her cough and shortness of breath were thought to be due to her severe volume overload. Diuresis and guideline directed therapy were initiated.   Labs reviewed from [place] clinic dated 03/03/2020:  Hemoglobin 14.2, white blood cell 4.5, platelets 307  Creatinine 0.97, GFR 72, K3.8, CA 9.7, AST 12, ALT 11]  At last visit 03/14/20 she reported feeling much better, stable 2 pillow orthopnea, and quitting smoking marijuana.  In discussion with Dr. Fletcher Anon (physician performing cath) and Dr. Rockey Situ (her primary cardiologist) it was recommended to maximize medical therapy to see her clinical progress. She is unable to undergo cardiac MRI for cardiac viability due to metal in her head from cerebral artery aneurysm repair. If she has anginal symptoms, a CTO PCI of LAD could be considered.   She reports feeling overall well.  She has been participating cardiac rehab.  She has made a very good friend in [place] as they were the only 2 ladies in the group.  Endorses some improvement in her DOE.  No LE edema, shortness of breath at rest, orthopnea, PND.  Continues to have intermittent cough at nighttime which improves with Tessalon as needed.  Reports no lightheadedness, dizziness, near syncope.  Reports no chest pain, pressure, tightness.    She is up 10 pounds from last clinic visit.  She has been weighing herself and noted  that she wanted to talk with [place] today about what to do.     EKGs/Labs/Other Studies Reviewed:   The following studies were reviewed today:  LHC 02/25/2020  Ost LAD to Prox LAD lesion is 100% stenosed.  Mid Cx lesion is 30% stenosed.  3rd Mrg lesion is 30% stenosed. 1.  Left dominant coronary arteries with chronically occluded ostial LAD with some right to left and left  to left collaterals.  Mild disease affecting the left circumflex. 2.  Right heart catheterization showed severely elevated filling pressures with RA pressure of 10 mmHg, pulmonary capillary wedge pressure of 31 mmHg with prominent V wave suggestive of mitral regurgitation, moderate pulmonary hypertension at 55/31 mmHg and mildly reduced cardiac output at 4.56 with a cardiac index of 2.47.  LVEDP was 34 mmHg. Recommendations: The patient likely had an LAD infarct 2 weeks ago.  I suspect that the anterior wall is now nonviable.  Unfortunately, she is left with severe ischemic cardiomyopathy and right heart catheterization shows evidence of severe volume overload likely responsible for her symptoms of shortness of breath and dry cough. I am going to start intravenous diuresis with furosemide 40 mg twice daily.  I added small dose carvedilol and losartan.  Recommend small dose spironolactone before hospital discharge and consider switching losartan to [facility] as an outpatient. Recommend aggressive medical therapy for coronary artery disease.     Echo 02/25/2020  1. Left ventricular ejection fraction, by estimation, is 20 to 25%. The  left ventricle has severely decreased function. The left ventricle  demonstrates regional wall motion abnormalities (see scoring  diagram/findings for description). The left  ventricular internal cavity size was mildly dilated. Left ventricular  diastolic parameters are consistent with Grade II diastolic dysfunction  (pseudonormalization). There is akinesis of the left ventricular,  mid-apical anteroseptal wall, anterior segment   and apical segment. Prominent apical trabeculation with no clear apical  thrombus with echo contrast.   2. Right ventricular systolic function is normal. The right ventricular  size is normal. Tricuspid regurgitation signal is inadequate for assessing  PA pressure.   3. Left atrial size was mildly dilated.   4. The mitral valve is normal in  structure and function. Mild to moderate  mitral valve regurgitation. No evidence of mitral stenosis.   5. The aortic valve is normal in structure and function. Aortic valve  regurgitation is not visualized. No aortic stenosis is present.   EKG:  EKG is ordered today.  The ekg ordered today demonstrates SR 69 bpm with stable septal wave inversions (lead V1, V2). T wave abnormality noted with overall flat T-waves in lateral leads, but stable compared to previous  Recent Labs: 02/27/2020: Hemoglobin 14.5; Magnesium 2.4; Platelets 375 04/11/2020: BUN 15; Creatinine, Ser 0.87; Potassium 4.6; Sodium 141  Recent Lipid Panel    Component Value Date/Time   CHOL 181 02/25/2020 0352   TRIG 64 02/25/2020 0352   HDL 45 02/25/2020 0352   CHOLHDL 4.0 02/25/2020 0352   VLDL 13 02/25/2020 0352   LDLCALC 123 (H) 02/25/2020 0352    Home Medications   Current Meds  Medication Sig  . aspirin 81 MG chewable tablet Chew 1 tablet (81 mg total) by mouth daily.  . benzonatate (TESSALON PERLES) 100 MG capsule Take 1 capsule (100 mg total) by mouth every 6 (six) hours as needed for cough.  . fluticasone (FLONASE) 50 MCG/ACT nasal spray Place 1-2 sprays into both nostrils daily as needed for allergies.  . furosemide (LASIX) 40 MG  tablet Take 1 tablet (40 mg total) by mouth 2 (two) times daily.  Marland Kitchen gabapentin (NEURONTIN) 300 MG capsule Take 300 mg by mouth 3 (three) times daily. (take each capsule with 600mg  tablet to equal 900mg  three times a day)  . gabapentin (NEURONTIN) 600 MG tablet Take 600 mg by mouth 3 (three) times daily. (take each tablet with 300mg  capsule to equal 900mg  three times a day)  . HYDROcodone-acetaminophen (NORCO/VICODIN) 5-325 MG tablet Take 1 tablet by mouth every 6 (six) hours as needed for moderate pain.  losartan (COZAAR) 25 MG tablet Take 0.5 tablets (12.5 mg total) by mouth daily.  . meloxicam (MOBIC) 7.5 MG tablet Take 7.5 mg by mouth daily.  . metoprolol succinate (TOPROL-XL) 25 MG  24 hr tablet Take 1 tablet (25 mg total) by mouth daily.  omeprazole (PRILOSEC) 20 MG capsule Take 20 mg by mouth daily.  . simvastatin (ZOCOR) 40 MG tablet Take 1 tablet (40 mg total) by mouth at bedtime.  spironolactone (ALDACTONE) 25 MG tablet Take 0.5 tablets (12.5 mg total) by mouth daily.  . [DISCONTINUED] benzonatate (TESSALON PERLES) 100 MG capsule Take 1 capsule (100 mg total) by mouth every 6 (six) hours as needed for cough.  . [DISCONTINUED] losartan (COZAAR) 25 MG tablet Take 0.5 tablets (12.5 mg total) by mouth daily.  . [DISCONTINUED] metoprolol succinate (TOPROL-XL) 25 MG 24 hr tablet Take 1 tablet (25 mg total) by mouth daily.    Review of Systems  Review of Systems  Constitutional: Negative for chills, fever and malaise/fatigue.  Cardiovascular: Positive for dyspnea on exertion (improving). Negative for chest pain, leg swelling, near-syncope, orthopnea, palpitations and syncope.  Respiratory: Positive for cough ("sometimes at night"). Negative for shortness of breath and wheezing.   Gastrointestinal: Negative for nausea and vomiting.  Neurological: Negative for dizziness, light-headedness and weakness.   All other systems reviewed and are otherwise negative except as noted above.  Physical Exam    VS:  BP 110/72 (BP Location: Left Arm, Patient Position: Sitting, Cuff Size: Normal)   Pulse 69   Ht 5\' 5"  (1.651 m)   Wt 179 lb 4 oz (81.3 kg)   SpO2 95%   BMI 29.83 kg/m  , BMI Body mass index is 29.83 kg/m. GEN: Well nourished, well developed, in no acute distress. HEENT: normal. Neck: Supple, no JVD, carotid bruits, or masses. Cardiac: RRR, no murmurs, rubs, or gallops. No clubbing, cyanosis, edema.  Radials/DP/PT 2+ and equal bilaterally.  Respiratory:  Respirations regular and unlabored, clear to auscultation bilaterally. GI: Soft, nontender, nondistended, BS + x 4. MS: No deformity or atrophy. Skin: Warm and dry, no rash. Neuro:  Strength and sensation are  intact. Psych: Normal affect.  Assessment & Plan    1. CAD - Late presenting anterior STEMI 02/2020. R/LHC 02/25/20 with CTO ostial LAD with right to left and left ot left collaterals, mild dz (30%) to LCx. Stable with no anginal symptoms. No indication for ischemic evaluation at this time. GDMT includes aspirin, beta blocker, statin.  She is presently participating in cardiac rehab and enjoying it.  Encourage low-sodium, healthy diet.  2. HFrEF/ICM - Echo 02/25/20 LVEF 20-25%. Up 10 pounds from last visit with no noted edema. Endorses some dietary indiscretion. Reports no SOB, edema - reports DOE is stable. NYHA II.     Continue GDMT including Losartan 25mg , Toprol 25mg , Spironolactone 12.5mg , Lasix 40mg  BID.  Anticipate transition to Marland Kitchen will be cost prohibitive.   Low sodium diet encouraged.  Daily weights encouraged. Will report gain of 3lb overnight or 5lb in one week.   For 3 days she will take Lasix 80mg  in the AM. After 3 days she will return to 40mg  twice daily.   Repeat echo as she is 3 months on GDMT. Will allow for assessment of whether further escalation of therapy is needed.   1. Moderate pulmonary HTN - Echo 02/2020 with mod pulmonary HTN 55/71mmHg. Lasix, as above  2. Mitral regurgitation - Noted by echo 02/2020. Continue to optimize blood pressure and fluid status.   3. HLD, LDL goal <70 - 02/25/20 LDL 03/2020. Pravastatin increased from 20mg  to 40mg  at time of discharge 03/14/20. Lipid panel today.   4. Cough -reports intermittent cough at nighttime.  Request refill of Tessalon.  Provided.  Encouraged to discuss with her primary care provider.  Disposition: Follow up in 3 month(s) with Dr. 185 or APP   , NP 06/17/2020, 4:59 PM

## 2020-06-18 ENCOUNTER — Encounter: Payer: Medicare Other | Admitting: *Deleted

## 2020-06-18 ENCOUNTER — Telehealth: Payer: Self-pay | Admitting: *Deleted

## 2020-06-18 DIAGNOSIS — I5042 Chronic combined systolic (congestive) and diastolic (congestive) heart failure: Secondary | ICD-10-CM

## 2020-06-18 LAB — COMPREHENSIVE METABOLIC PANEL
ALT: 13 IU/L (ref 0–32)
AST: 14 IU/L (ref 0–40)
Albumin/Globulin Ratio: 1.5 (ref 1.2–2.2)
Albumin: 4.1 g/dL (ref 3.8–4.8)
Alkaline Phosphatase: 108 IU/L (ref 48–121)
BUN/Creatinine Ratio: 15 (ref 12–28)
BUN: 14 mg/dL (ref 8–27)
Bilirubin Total: 0.5 mg/dL (ref 0.0–1.2)
CO2: 24 mmol/L (ref 20–29)
Calcium: 9.4 mg/dL (ref 8.7–10.3)
Chloride: 108 mmol/L — ABNORMAL HIGH (ref 96–106)
Creatinine, Ser: 0.91 mg/dL (ref 0.57–1.00)
GFR calc Af Amer: 78 mL/min/{1.73_m2} (ref >59)
GFR calc non Af Amer: 68 mL/min/{1.73_m2} (ref >59)
Globulin, Total: 2.7 g/dL (ref 1.5–4.5)
Glucose: 88 mg/dL (ref 65–99)
Potassium: 4.3 mmol/L (ref 3.5–5.2)
Sodium: 143 mmol/L (ref 134–144)
Total Protein: 6.8 g/dL (ref 6.0–8.5)

## 2020-06-18 LAB — LIPID PANEL
Chol/HDL Ratio: 3.3 ratio (ref 0.0–4.4)
Cholesterol, Total: 178 mg/dL (ref 100–199)
HDL: 54 mg/dL (ref >39)
LDL Chol Calc (NIH): 102 mg/dL — ABNORMAL HIGH (ref 0–99)
Triglycerides: 122 mg/dL (ref 0–149)
VLDL Cholesterol Cal: 22 mg/dL (ref 5–40)

## 2020-06-18 LAB — BRAIN NATRIURETIC PEPTIDE: BNP: 273.8 pg/mL — ABNORMAL HIGH (ref 0.0–100.0)

## 2020-06-18 NOTE — Progress Notes (Signed)
Daily Session Note  Patient Details  Name: Hannah Martinez MRN: [id number] Date of Birth: 1956-08-15 Referring Provider:     Cardiac Rehab from 04/24/2020 in [facility] Cardiac and Pulmonary Rehab  Referring Provider Ida Rogue MD      Encounter Date: 06/18/2020  Check In:  Session Check In - 06/18/20 1454      Check-In   Supervising physician immediately available to respond to emergencies See telemetry face sheet for immediately available ER MD    Location [facility]-Cardiac & Pulmonary Rehab    Staff Present Renita Papa, RN BSN;Melissa Caiola RDN, Rowe Pavy, BA, ACSM CEP, Exercise Physiologist    Virtual Visit No    Medication changes reported     No    Fall or balance concerns reported    No    Warm-up and Cool-down Performed on first and last piece of equipment    Resistance Training Performed Yes    VAD Patient? No    PAD/SET Patient? No      Pain Assessment   Currently in Pain? No/denies              Social History   Tobacco Use  Smoking Status Never Smoker  Smokeless Tobacco Never Used    Goals Met:  Independence with exercise equipment Exercise tolerated well No report of cardiac concerns or symptoms Strength training completed today  Goals Unmet:  Not Applicable  Comments: Pt able to follow exercise prescription today without complaint.  Will continue to monitor for progression.    Dr. Emily Filbert is Medical Director for [place] and [facility].

## 2020-06-18 NOTE — Telephone Encounter (Signed)
-----   Message from Alver Sorrow, NP sent at 06/18/2020  7:46 AM EDT ----- Normal kidney function, liver function. Electrolytes stable.   LDL or "lousy cholesterol" has gone from 123 to 102 since her Pravastatin was increased in March. Not at goal of less than 70, but improved. For further optimization, add Zetia 10mg  daily. Repeat lipid/liver in 8 weeks

## 2020-06-18 NOTE — Telephone Encounter (Signed)
Left voicemail message to call back for results and recommendations. 

## 2020-06-19 NOTE — Telephone Encounter (Signed)
Call to patient to review labs.    Pt verbalized understanding and has no further questions at this time.    Advised pt to call for any further questions or concerns.  No further orders.   

## 2020-06-19 NOTE — Telephone Encounter (Signed)
-----   Message from Alver Sorrow, NP sent at 06/19/2020  7:58 AM EDT ----- BNP mildly elevated suggestive of fluid retention. Her Lasix was increased at recent office visit for 3 days which should resolve this.

## 2020-06-23 ENCOUNTER — Other Ambulatory Visit: Payer: Self-pay

## 2020-06-23 ENCOUNTER — Encounter: Payer: Medicare Other | Admitting: *Deleted

## 2020-06-23 DIAGNOSIS — I5042 Chronic combined systolic (congestive) and diastolic (congestive) heart failure: Secondary | ICD-10-CM

## 2020-06-23 NOTE — Progress Notes (Signed)
Daily Session Note  Patient Details  Name: Hannah Martinez MRN: [id number] Date of Birth: [birth year] Referring Provider:     Cardiac Rehab from 04/24/2020 in [facility] Cardiac and Pulmonary Rehab  Referring Provider Ida Rogue MD      Encounter Date: 06/23/2020  Check In:  Session Check In - 06/23/20 1507      Check-In   Supervising physician immediately available to respond to emergencies See telemetry face sheet for immediately available ER MD    Location [facility]-Cardiac & Pulmonary Rehab    Staff Present Renita Papa, RN BSN;Joseph Lou Miner, [place] Exercise Physiologist;Amanda Oletta Darter, [place], ACSM CEP, Exercise Physiologist    Virtual Visit No    Medication changes reported     No    Fall or balance concerns reported    No    Warm-up and Cool-down Performed on first and last piece of equipment    Resistance Training Performed Yes    VAD Patient? No    PAD/SET Patient? No      Pain Assessment   Currently in Pain? No/denies              Social History   Tobacco Use  Smoking Status Never Smoker  Smokeless Tobacco Never Used    Goals Met:  Independence with exercise equipment Exercise tolerated well No report of cardiac concerns or symptoms Strength training completed today  Goals Unmet:  Not Applicable  Comments: Pt able to follow exercise prescription today without complaint.  Will continue to monitor for progression.    Dr. Emily Filbert is Medical Director for [place] and [facility].

## 2020-06-25 ENCOUNTER — Encounter: Payer: Medicare Other | Admitting: *Deleted

## 2020-06-25 ENCOUNTER — Other Ambulatory Visit: Payer: Self-pay

## 2020-06-25 DIAGNOSIS — I5042 Chronic combined systolic (congestive) and diastolic (congestive) heart failure: Secondary | ICD-10-CM | POA: Diagnosis not present

## 2020-06-25 NOTE — Progress Notes (Signed)
Daily Session Note  Patient Details  Name: Hannah Martinez MRN: [id number] Date of Birth: 10-12-1956 Referring Provider:     Cardiac Rehab from 04/24/2020 in [facility] Cardiac and Pulmonary Rehab  Referring Provider Ida Rogue MD      Encounter Date: 06/25/2020  Check In:  Session Check In - 06/25/20 1501      Check-In   Supervising physician immediately available to respond to emergencies See telemetry face sheet for immediately available ER MD    Location [facility]-Cardiac & Pulmonary Rehab    Staff Present Renita Papa, RN BSN;Jessica Trenton, MA, RCEP, CCRP, CCET;Amanda Sommer, BA, ACSM CEP, Exercise Physiologist;Laureen Owens Shark, BS, RRT, CPFT    Virtual Visit No    Medication changes reported     No    Fall or balance concerns reported    No    Warm-up and Cool-down Performed on first and last piece of equipment    Resistance Training Performed Yes    VAD Patient? No      Pain Assessment   Currently in Pain? No/denies              Social History   Tobacco Use  Smoking Status Never Smoker  Smokeless Tobacco Never Used    Goals Met:  Independence with exercise equipment Exercise tolerated well No report of cardiac concerns or symptoms Strength training completed today  Goals Unmet:  Not Applicable  Comments: Pt able to follow exercise prescription today without complaint.  Will continue to monitor for progression.    Dr. Emily Filbert is Medical Director for [place] and [facility].

## 2020-07-02 ENCOUNTER — Other Ambulatory Visit: Payer: Self-pay

## 2020-07-02 ENCOUNTER — Encounter: Payer: Medicare Other | Attending: Cardiovascular Disease | Admitting: *Deleted

## 2020-07-02 DIAGNOSIS — I5042 Chronic combined systolic (congestive) and diastolic (congestive) heart failure: Secondary | ICD-10-CM | POA: Insufficient documentation

## 2020-07-02 NOTE — Progress Notes (Signed)
Daily Session Note  Patient Details  Name: Hannah Martinez MRN: [id number] Date of Birth: 1956/11/26 Referring Provider:     Cardiac Rehab from 04/24/2020 in [facility] Cardiac and Pulmonary Rehab  Referring Provider Ida Rogue MD      Encounter Date: 07/02/2020  Check In:  Session Check In - 07/02/20 1542      Check-In   Supervising physician immediately available to respond to emergencies See telemetry face sheet for immediately available ER MD    Location [facility]-Cardiac & Pulmonary Rehab    Staff Present Renita Papa, RN BSN;Joseph Lou Miner, [place] Exercise Physiologist    Virtual Visit No    Medication changes reported     No    Fall or balance concerns reported    No    Warm-up and Cool-down Performed on first and last piece of equipment    Resistance Training Performed Yes    VAD Patient? No    PAD/SET Patient? No      Pain Assessment   Currently in Pain? No/denies              Social History   Tobacco Use  Smoking Status Never Smoker  Smokeless Tobacco Never Used    Goals Met:  Independence with exercise equipment Exercise tolerated well No report of cardiac concerns or symptoms Strength training completed today  Goals Unmet:  Not Applicable  Comments: Pt able to follow exercise prescription today without complaint.  Will continue to monitor for progression.    Dr. Emily Filbert is Medical Director for [place] and [facility].

## 2020-07-06 ENCOUNTER — Other Ambulatory Visit: Payer: Self-pay | Admitting: Family

## 2020-07-07 ENCOUNTER — Other Ambulatory Visit: Payer: Self-pay

## 2020-07-07 ENCOUNTER — Encounter: Payer: Medicare Other | Admitting: *Deleted

## 2020-07-07 DIAGNOSIS — I5042 Chronic combined systolic (congestive) and diastolic (congestive) heart failure: Secondary | ICD-10-CM | POA: Diagnosis not present

## 2020-07-07 NOTE — Progress Notes (Signed)
Daily Session Note  Patient Details  Name: Hannah Martinez MRN: [id number] Date of Birth: [birth year] Referring Provider:     Cardiac Rehab from 04/24/2020 in [facility] Cardiac and Pulmonary Rehab  Referring Provider Ida Rogue MD      Encounter Date: 07/07/2020  Check In:  Session Check In - 07/07/20 1546      Check-In   Supervising physician immediately available to respond to emergencies See telemetry face sheet for immediately available ER MD    Location [facility]-Cardiac & Pulmonary Rehab    Staff Present Renita Papa, RN Moises Blood, BS, ACSM CEP, Exercise Physiologist;Kara Eliezer Bottom, MS Exercise Physiologist    Virtual Visit No    Medication changes reported     No    Fall or balance concerns reported    No    Warm-up and Cool-down Performed on first and last piece of equipment    Resistance Training Performed Yes    VAD Patient? No    PAD/SET Patient? No      Pain Assessment   Currently in Pain? No/denies              Social History   Tobacco Use  Smoking Status Never Smoker  Smokeless Tobacco Never Used    Goals Met:  Independence with exercise equipment Exercise tolerated well No report of cardiac concerns or symptoms Strength training completed today  Goals Unmet:  Not Applicable  Comments: Pt able to follow exercise prescription today without complaint.  Will continue to monitor for progression.    Dr. Emily Filbert is Medical Director for [place] and [facility].

## 2020-07-09 ENCOUNTER — Other Ambulatory Visit: Payer: Self-pay

## 2020-07-09 ENCOUNTER — Encounter: Payer: Self-pay | Admitting: *Deleted

## 2020-07-09 ENCOUNTER — Encounter: Payer: Medicare Other | Admitting: *Deleted

## 2020-07-09 DIAGNOSIS — I5042 Chronic combined systolic (congestive) and diastolic (congestive) heart failure: Secondary | ICD-10-CM

## 2020-07-09 NOTE — Progress Notes (Signed)
Cardiac Individual Treatment Plan  Patient Details  Name: Hannah Martinez MRN: [id number] Date of Birth: 12/18/1956 Referring Provider:     Cardiac Rehab from 04/24/2020 in [facility] Cardiac and Pulmonary Rehab  Referring Provider Ida Rogue MD      Initial Encounter Date:    Cardiac Rehab from 04/24/2020 in [facility] Cardiac and Pulmonary Rehab  Date 04/24/20      Visit Diagnosis: Heart failure, systolic and diastolic, chronic ([place])  Patient's Home Medications on Admission:  Current Outpatient Medications:  .  aspirin 81 MG chewable tablet, Chew 1 tablet (81 mg total) by mouth daily., Disp: 90 tablet, Rfl: 0 .  benzonatate (TESSALON PERLES) 100 MG capsule, Take 1 capsule (100 mg total) by mouth every 6 (six) hours as needed for cough., Disp: 30 capsule, Rfl: 0 .  fluticasone (FLONASE) 50 MCG/ACT nasal spray, Place 1-2 sprays into both nostrils daily as needed for allergies., Disp: , Rfl:  .  furosemide (LASIX) 40 MG tablet, Take 1 tablet (40 mg total) by mouth 2 (two) times daily., Disp: 180 tablet, Rfl: 1 .  gabapentin (NEURONTIN) 300 MG capsule, Take 300 mg by mouth 3 (three) times daily. (take each capsule with 655m tablet to equal 9085mthree times a day), Disp: , Rfl:  .  gabapentin (NEURONTIN) 600 MG tablet, Take 600 mg by mouth 3 (three) times daily. (take each tablet with 30045mapsule to equal 900m31mree times a day), Disp: , Rfl:  .  HYDROcodone-acetaminophen (NORCO/VICODIN) 5-325 MG tablet, Take 1 tablet by mouth every 6 (six) hours as needed for moderate pain., Disp: 10 tablet, Rfl: 0 .  losartan (COZAAR) 25 MG tablet, Take 0.5 tablets (12.5 mg total) by mouth daily., Disp: 45 tablet, Rfl: 1 .  meloxicam (MOBIC) 7.5 MG tablet, Take 7.5 mg by mouth daily., Disp: , Rfl:  .  metoprolol succinate (TOPROL-XL) 25 MG 24 hr tablet, Take 1 tablet (25 mg total) by mouth daily., Disp: 45 tablet, Rfl: 1 .  omeprazole (PRILOSEC) 20 MG capsule, Take 20 mg by mouth daily., Disp: , Rfl:  .   simvastatin (ZOCOR) 40 MG tablet, TAKE 1 TABLET BY MOUTH EVERYDAY AT BEDTIME, Disp: 90 tablet, Rfl: 3 .  spironolactone (ALDACTONE) 25 MG tablet, Take 0.5 tablets (12.5 mg total) by mouth daily., Disp: 45 tablet, Rfl: 1  Past Medical History: Past Medical History:  Diagnosis Date  . Cerebral aneurysm   . Chronic pain   . Degenerative joint disease   . Hypercholesteremia   . Stroke (HCC)[place]. TMJ (dislocation of temporomandibular joint)     Tobacco Use: Social History   Tobacco Use  Smoking Status Never Smoker  Smokeless Tobacco Never Used    Labs: Recent Review [employer], physiologicalLabs for ITP Cardiac and Pulmonary Rehab Latest Ref Rng & Units 02/25/2020 06/17/2020   Cholestrol 100 - 199 mg/dL 181 178   LDLCALC 0 - 99 mg/dL 123(H) 102(H)   HDL >39 mg/dL 45 54   Trlycerides 0 - 149 mg/dL 64 122   Hemoglobin A1c 4.8 - 5.6 % 4.9 -       Exercise Target Goals: Exercise Program Goal: Individual exercise prescription set using results from initial 6 min walk test and THRR while considering  patient's activity barriers and safety.   Exercise Prescription Goal: Initial exercise prescription builds to 30-45 minutes a day of aerobic activity, 2-3 days per week.  Home exercise guidelines will be given to patient during program as part of  exercise prescription that the participant will acknowledge.   Education: Aerobic Exercise & Resistance Training: - Gives group verbal and written instruction on the various components of exercise. Focuses on aerobic and resistive training programs and the benefits of this training and how to safely progress through these programs..   Education: Exercise & Equipment Safety: - Individual verbal instruction and demonstration of equipment use and safety with use of the equipment.   Cardiac Rehab from 04/24/2020 in [facility] Cardiac and Pulmonary Rehab  Date 04/24/20  Educator [facility]  Instruction Review Code 1- Verbalizes Understanding      Education:  Exercise Physiology & General Exercise Guidelines: - Group verbal and written instruction with models to review the exercise physiology of the cardiovascular system and associated critical values. Provides general exercise guidelines with specific guidelines to those with heart or lung disease.    Education: Flexibility, Balance, Mind/Body Relaxation: Provides group verbal/written instruction on the benefits of flexibility and balance training, including mind/body exercise modes such as yoga, pilates and tai chi.  Demonstration and skill practice provided.   Activity Barriers & Risk Stratification:  Activity Barriers & Cardiac Risk Stratification - 04/24/20 1011      Activity Barriers & Cardiac Risk Stratification   Activity Barriers Arthritis;Other (comment);Deconditioning;Muscular Weakness;Assistive Device    Comments Left sided weakness post CVA 1999  2003 (very limited left arm use)    Cardiac Risk Stratification High           6 Minute Walk:  6 Minute Walk    Row Name 04/24/20 1009         6 Minute Walk   Phase Initial     Distance 574 feet     Walk Time 6 minutes     # of Rest Breaks 0     MPH 1.08     METS 2.25     RPE 9     VO2 Peak 7.86     Symptoms No     Resting HR 82 bpm     Resting BP 146/70     Resting Oxygen Saturation  99 %     Exercise Oxygen Saturation  during 6 min walk 99 %     Max Ex. HR 108 bpm     Max Ex. BP 146/74     2 Minute Post BP 132/60            Oxygen Initial Assessment:   Oxygen Re-Evaluation:   Oxygen Discharge (Final Oxygen Re-Evaluation):   Initial Exercise Prescription:  Initial Exercise Prescription - 04/24/20 1000      Date of Initial Exercise RX and Referring Provider   Date 04/24/20    Referring Provider Ida Rogue MD      Treadmill   MPH 0.7    Grade 0    Minutes 15    METs 1.5      NuStep   Level 1    SPM 80    Minutes 15    METs 1      Biostep-RELP   Level 1    SPM 50    Minutes 15    METs  1      Prescription Details   Frequency (times per week) 3    Duration Progress to 30 minutes of continuous aerobic without signs/symptoms of physical distress      Intensity   THRR 40-80% of Max Heartrate 112-143    Ratings of Perceived Exertion 11-13    Perceived Dyspnea 0-4  Progression   Progression Continue to progress workloads to maintain intensity without signs/symptoms of physical distress.      Resistance Training   Training Prescription Yes    Weight 3 lb    Reps 10-15           Perform Capillary Blood Glucose checks as needed.  Exercise Prescription Changes:  Exercise Prescription Changes    Row Name 04/24/20 1000 05/06/20 1500 05/22/20 1500 06/05/20 1400 06/16/20 1500     Response to Exercise   Blood Pressure (Admit) 146/70 124/70 98/5 90/58 102/60   Blood Pressure (Exercise) 146/74 140/72 124/70 110/58 118/68   Blood Pressure (Exit) 132/60 100/60 106/58 88/56 112/70   Heart Rate (Admit) 82 bpm 66 bpm 76 bpm 75 bpm 90 bpm   Heart Rate (Exercise) 108 bpm 100 bpm 102 bpm 93 bpm 91 bpm   Heart Rate (Exit) 85 bpm 107 bpm 85 bpm 77 bpm 88 bpm   Oxygen Saturation (Admit) 99 % -- -- -- --   Oxygen Saturation (Exercise) 99 % -- -- -- --   Rating of Perceived Exertion (Exercise) _0 Symptoms limps from strokes -- none none none   Comments walk test results -- -- -- --   Duration -- -- Continue with 30 min of aerobic exercise without signs/symptoms of physical distress. Continue with 30 min of aerobic exercise without signs/symptoms of physical distress. Continue with 30 min of aerobic exercise without signs/symptoms of physical distress.   Intensity -- -- THRR unchanged THRR unchanged THRR unchanged     Progression   Progression -- -- Continue to progress workloads to maintain intensity without signs/symptoms of physical distress. Continue to progress workloads to maintain intensity without signs/symptoms of physical distress. Continue to progress  workloads to maintain intensity without signs/symptoms of physical distress.   Average METs -- -- 2.03 2.5 2.65     Resistance Training   Training Prescription -- -- Yes Yes Yes   Weight -- -- 3 lb 3 lb 3 lb   Reps -- -- 10-15 10-15 10-15     Interval Training   Interval Training -- -- No No No     Treadmill   MPH -- -- 0.7 -- --   Grade -- -- 0 -- --   Minutes -- -- 3 -- --   METs -- -- 1.5 -- --     NuStep   Level -- _1 SPM -- 80 -- -- --   Minutes -- _2 METs -- 2.1 2.6 2 2.9     Biostep-RELP   Level -- _3 --   SPM -- 50 -- -- --   Minutes -- _4 --   METs -- _5 --     Home Exercise Plan   Plans to continue exercise at -- -- -- -- Home (comment)  walking   Frequency -- -- -- -- Add 2 additional days to program exercise sessions.   Initial Home Exercises Provided -- -- -- -- 06/09/20   Row Name 07/02/20 1500             Response to Exercise   Blood Pressure (Admit) 102/66       Blood Pressure (Exercise) 144/66       Blood Pressure (Exit) 90/54       Heart Rate (Admit) 87 bpm       Heart Rate (Exercise) 106 bpm  Heart Rate (Exit) 82 bpm       Rating of Perceived Exertion (Exercise) 12       Symptoms none       Duration Continue with 30 min of aerobic exercise without signs/symptoms of physical distress.       Intensity THRR unchanged         Progression   Progression Continue to progress workloads to maintain intensity without signs/symptoms of physical distress.       Average METs 3.3         Resistance Training   Training Prescription Yes       Weight 3 lb       Reps 10-15         Interval Training   Interval Training No         NuStep   Level 2       SPM 80       Minutes 15       METs 3.3         Home Exercise Plan   Plans to continue exercise at Home (comment)  walking       Frequency Add 2 additional days to program exercise sessions.       Initial Home Exercises Provided 06/09/20               Exercise Comments:   Exercise Goals and Review:  Exercise Goals    Row Name 04/24/20 1016             Exercise Goals   Increase Physical Activity Yes       Intervention Provide advice, education, support and counseling about physical activity/exercise needs.;Develop an individualized exercise prescription for aerobic and resistive training based on initial evaluation findings, risk stratification, comorbidities and participant's personal goals.       Expected Outcomes Short Term: Attend rehab on a regular basis to increase amount of physical activity.;Long Term: Add in home exercise to make exercise part of routine and to increase amount of physical activity.;Long Term: Exercising regularly at least 3-5 days a week.       Increase Strength and Stamina Yes       Intervention Provide advice, education, support and counseling about physical activity/exercise needs.;Develop an individualized exercise prescription for aerobic and resistive training based on initial evaluation findings, risk stratification, comorbidities and participant's personal goals.       Expected Outcomes Short Term: Increase workloads from initial exercise prescription for resistance, speed, and METs.;Short Term: Perform resistance training exercises routinely during rehab and add in resistance training at home;Long Term: Improve cardiorespiratory fitness, muscular endurance and strength as measured by increased METs and functional capacity (6MWT)       Able to understand and use rate of perceived exertion (RPE) scale Yes       Intervention Provide education and explanation on how to use RPE scale       Expected Outcomes Short Term: Able to use RPE daily in rehab to express subjective intensity level;Long Term:  Able to use RPE to guide intensity level when exercising independently       Able to understand and use Dyspnea scale Yes       Intervention Provide education and explanation on how to use Dyspnea scale        Expected Outcomes Short Term: Able to use Dyspnea scale daily in rehab to express subjective sense of shortness of breath during exertion;Long Term: Able to use Dyspnea scale to guide intensity level when  exercising independently       Knowledge and understanding of Target Heart Rate Range (THRR) Yes       Intervention Provide education and explanation of THRR including how the numbers were predicted and where they are located for reference       Expected Outcomes Short Term: Able to state/look up THRR;Short Term: Able to use daily as guideline for intensity in rehab;Long Term: Able to use THRR to govern intensity when exercising independently       Able to check pulse independently Yes       Intervention Provide education and demonstration on how to check pulse in carotid and radial arteries.;Review the importance of being able to check your own pulse for safety during independent exercise       Expected Outcomes Short Term: Able to explain why pulse checking is important during independent exercise;Long Term: Able to check pulse independently and accurately       Understanding of Exercise Prescription Yes       Intervention Provide education, explanation, and written materials on patient's individual exercise prescription       Expected Outcomes Short Term: Able to explain program exercise prescription;Long Term: Able to explain home exercise prescription to exercise independently              Exercise Goals Re-Evaluation :  Exercise Goals Re-Evaluation    Row Name 04/28/20 1515 05/06/20 1514 05/22/20 1515 06/05/20 1411 06/09/20 1512     Exercise Goal Re-Evaluation   Exercise Goals Review Increase Physical Activity;Able to understand and use rate of perceived exertion (RPE) scale;Knowledge and understanding of Target Heart Rate Range (THRR);Understanding of Exercise Prescription;Increase Strength and Stamina;Able to check pulse independently Increase Physical Activity;Able to understand and use  rate of perceived exertion (RPE) scale;Knowledge and understanding of Target Heart Rate Range (THRR);Understanding of Exercise Prescription;Increase Strength and Stamina;Able to check pulse independently Increase Physical Activity;Increase Strength and Stamina;Understanding of Exercise Prescription Increase Physical Activity;Increase Strength and Stamina;Understanding of Exercise Prescription Increase Physical Activity;Increase Strength and Stamina;Understanding of Exercise Prescription   Comments Reviewed RPE and dyspnea scales, THR and program prescription with pt today.  Pt voiced understanding and was given a copy of goals to take home. Hannah Martinez has tolerated exercise well.  She was able to walk on TM for a few minutes.  Staff will monito progress. Hannah Martinez is doing well in rehab.  She is still fearful of the treadmill, and has been averaging around 3 min on there.  She is up to level 3 on BioStep and NuStep.  We will continue to monitor her progress. Hannah Martinez has been doing well in rehab. She has not been on the treadmill recently.  We will encourage moving up workloads.  We will continue to montior her progress. Hannah Martinez is doing well in rehab.  She is walking in her parking lot  at home for about 20 min.  We talked about increasing to 30 min. Reviewed home exercise with pt today.  Pt plans to walk longer and staff videos for exercise.  Reviewed THR, pulse, RPE, sign and symptoms, pulse oximetery and when to call 911 or MD.  Also discussed weather considerations and indoor options.  Pt voiced understanding.   Expected Outcomes Short: Use RPE daily to regulate intensity. Long: Follow program prescription in THR. Short: attend program session sconsistently Long:  increase stamina Short: return to regular attendance  Long: Continue to improve stamina. Short: Increase workloads Long: Continue to improve stamina. Short: Increase walking time at home  Long: Continue to improve stamina.   [place] Name 06/16/20 1534 06/23/20 1507 07/02/20  1558         Exercise Goal Re-Evaluation   Exercise Goals Review Increase Physical Activity;Increase Strength and Stamina;Understanding of Exercise Prescription Increase Physical Activity;Increase Strength and Stamina;Understanding of Exercise Prescription Increase Physical Activity;Increase Strength and Stamina;Understanding of Exercise Prescription     Comments Hannah Martinez continues to do well in rehab. She is still avoiding the treadmill as she builds up her nerves to try again.  She is up to level 4 on the NuStep and we continue to strap her foot.  We will continue to monitor her progress. Hannah Martinez is doing well in rehab.  She is walking on her off days at home in the parking lot for about 20-72mn. She is also doing her weights on her good.  Her strength and stamina are improving.  She is now able to go shopping again. KWendelyn Breslowhas had success using the NS with the leg stabilizer attachment.  She has been able to increase MET levels.  Staff willmonitor progress.     Expected Outcomes Short: Continue to build up courage for treadmill  Long: Continue to improve stamina. Short: Continue to walk for at least 30 min on off days  Long: Continue improve stamina and strength. Short: continue current plan Long: improve stamina            Discharge Exercise Prescription (Final Exercise Prescription Changes):  Exercise Prescription Changes - 07/02/20 1500      Response to Exercise   Blood Pressure (Admit) 102/66    Blood Pressure (Exercise) 144/66    Blood Pressure (Exit) 90/54    Heart Rate (Admit) 87 bpm    Heart Rate (Exercise) 106 bpm    Heart Rate (Exit) 82 bpm    Rating of Perceived Exertion (Exercise) 12    Symptoms none    Duration Continue with 30 min of aerobic exercise without signs/symptoms of physical distress.    Intensity THRR unchanged      Progression   Progression Continue to progress workloads to maintain intensity without signs/symptoms of physical distress.    Average METs 3.3       Resistance Training   Training Prescription Yes    Weight 3 lb    Reps 10-15      Interval Training   Interval Training No      NuStep   Level 2    SPM 80    Minutes 15    METs 3.3      Home Exercise Plan   Plans to continue exercise at Home (comment)   walking   Frequency Add 2 additional days to program exercise sessions.    Initial Home Exercises Provided 06/09/20           Nutrition:  Target Goals: Understanding of nutrition guidelines, daily intake of sodium <15019m cholesterol <20044mcalories 30% from fat and 7% or less from saturated fats, daily to have 5 or more servings of fruits and vegetables.  Education: Controlling Sodium/Reading Food Labels -Group verbal and written material supporting the discussion of sodium use in heart healthy nutrition. Review and explanation with models, verbal and written materials for utilization of the food label.   Education: General Nutrition Guidelines/Fats and Fiber: -Group instruction provided by verbal, written material, models and posters to present the general guidelines for heart healthy nutrition. Gives an explanation and review of dietary fats and fiber.   Biometrics:  Pre Biometrics - 04/24/20 1017  Pre Biometrics   Height _0  (1.676 m)    Weight 170 lb 12.8 oz (77.5 kg)    BMI (Calculated) 27.58    Single Leg Stand 26.72 seconds            Nutrition Therapy Plan and Nutrition Goals:   Nutrition Assessments:  Nutrition Assessments - 04/24/20 1017      MEDFICTS Scores   Pre Score 78           MEDIFICTS Score Key:          ?70 Need to make dietary changes          40-70 Heart Healthy Diet         ? 40 Therapeutic Level Cholesterol Diet  Nutrition Goals Re-Evaluation:  Nutrition Goals Re-Evaluation    [place] Name 06/09/20 1523 06/23/20 1548           Goals   Nutrition Goal Work on more nutrious snacks and food. Work on more nutrious snacks and food.      Comment Hannah Martinez has already started to  work on her diet. She is working on Engineer, maintenance. Hannah Martinez continues to work on her diet.  She is making better choices.      Expected Outcome Short: Continue to work on diet  Long: Continue to follow heart healthy diet. Short: Continue to work on diet  Long: Continue to follow heart healthy diet.             Nutrition Goals Discharge (Final Nutrition Goals Re-Evaluation):  Nutrition Goals Re-Evaluation - 06/23/20 1548      Goals   Nutrition Goal Work on more nutrious snacks and food.    Comment Hannah Martinez continues to work on her diet.  She is making better choices.    Expected Outcome Short: Continue to work on diet  Long: Continue to follow heart healthy diet.           Psychosocial: Target Goals: Acknowledge presence or absence of significant depression and/or stress, maximize coping skills, provide positive support system. Participant is able to verbalize types and ability to use techniques and skills needed for reducing stress and depression.   Education: Depression - Provides group verbal and written instruction on the correlation between heart/lung disease and depressed mood, treatment options, and the stigmas associated with seeking treatment.   Education: Sleep Hygiene -Provides group verbal and written instruction about how sleep can affect your health.  Define sleep hygiene, discuss sleep cycles and impact of sleep habits. Review good sleep hygiene tips.     Education: Stress and Anxiety: - Provides group verbal and written instruction about the health risks of elevated stress and causes of high stress.  Discuss the correlation between heart/lung disease and anxiety and treatment options. Review healthy ways to manage with stress and anxiety.    Initial Review & Psychosocial Screening:  Initial Psych Review & Screening - 04/23/20 1114      Initial Review   Current issues with None Identified      Family Dynamics   Good Support System? Yes   Friends, First cousin checks  on her every day and her church family     Barriers   Psychosocial barriers to participate in program There are no identifiable barriers or psychosocial needs.;The patient should benefit from training in stress management and relaxation.      Screening Interventions   Interventions Encouraged to exercise;To provide support and resources with identified psychosocial needs;Provide feedback about the scores to participant  Expected Outcomes Short Term goal: Utilizing psychosocial counselor, staff and physician to assist with identification of specific Stressors or current issues interfering with healing process. Setting desired goal for each stressor or current issue identified.;Long Term Goal: Stressors or current issues are controlled or eliminated.;Short Term goal: Identification and review with participant of any Quality of Life or Depression concerns found by scoring the questionnaire.;Long Term goal: The participant improves quality of Life and PHQ9 Scores as seen by post scores and/or verbalization of changes           Quality of Life Scores:   Quality of Life - 04/24/20 1017      Quality of Life   Select Quality of Life      Quality of Life Scores   Health/Function Pre 29.57 %    Socioeconomic Pre 30 %    Psych/Spiritual Pre 30 %    Family Pre 30 %    GLOBAL Pre 26.82 %          Scores of 19 and below usually indicate a poorer quality of life in these areas.  A difference of  2-3 points is a clinically meaningful difference.  A difference of 2-3 points in the total score of the Quality of Life Index has been associated with significant improvement in overall quality of life, self-image, physical symptoms, and general health in studies assessing change in quality of life.  PHQ-9: Recent Review Flowsheet Data    Depression screen [facility] 2/9 06/23/2020 04/24/2020 04/24/2020   Decreased Interest 1 0 2   Down, Depressed, Hopeless 1 0 1   PHQ - 2 Score 2 0 3   Altered sleeping 0 0 2    Tired, decreased energy _0 Change in appetite 0 0 3   Feeling bad or failure about yourself  1 0 0   Trouble concentrating 1 0 0   Moving slowly or fidgety/restless 0 0 0   Suicidal thoughts 0 0 0   PHQ-9 Score _1 Difficult doing work/chores Somewhat difficult Not difficult at all Somewhat difficult     Interpretation of Total Score  Total Score Depression Severity:  1-4 = Minimal depression, 5-9 = Mild depression, 10-14 = Moderate depression, 15-19 = Moderately severe depression, 20-27 = Severe depression   Psychosocial Evaluation and Intervention:  Psychosocial Evaluation - 04/23/20 1126      Psychosocial Evaluation & Interventions   Comments Hannah Martinez has no recognized barriers to attending the program. She lives alone and wants a dog. She has friends and family as her support system. She is eager to start and have something to do every day. She has had 2 strokes in the past and has left sided weakness. She will use a electric wheelchair at times, does not expect to use it for this program. She should do well during her time here.    Expected Outcomes STG: Hannah Martinez will attend all sessions.  LTG: Hannah Martinez will be able to maintain the lifestyle changes learned while she attended the program.    Continue Psychosocial Services  Follow up required by staff           Psychosocial Re-Evaluation:  Psychosocial Re-Evaluation    [place] Name 06/09/20 1515 06/23/20 1520           Psychosocial Re-Evaluation   Current issues with Current Stress Concerns;Current Sleep Concerns Current Stress Concerns      Comments Hannah Martinez is doing well mentally.  She was getting frustrated with  transportation, but she thinks she finally has it straight and will talk to them about her July and August schedule this week.  She continues to cope with her limits from her stroke.  She does not sleep well, and she plans to talk to doctor about sleep.   We talked about watching sleep videos. Hannah Martinez is doing okay mentally.  She  is active in her church community and leans on the support of her people and God.  Her PHQ is up from her initial, but overall she is doing okay.  She has had more down days recently.  She sleeps well.  Overall, her focus is to maintain.  She is trying to keep the negative out of her life.      Expected Outcomes Short: watch sleep videos and talk to doctor about sleep  Long: Continue to stay positive. Short: Continue to focus on the positive  Long: Continue to cope and learn on church community.      Interventions -- Stress management education;Encouraged to attend Cardiac Rehabilitation for the exercise      Continue Psychosocial Services  -- Follow up required by staff             Psychosocial Discharge (Final Psychosocial Re-Evaluation):  Psychosocial Re-Evaluation - 06/23/20 1520      Psychosocial Re-Evaluation   Current issues with Current Stress Concerns    Comments Hannah Martinez is doing okay mentally.  She is active in her church community and leans on the support of her people and God.  Her PHQ is up from her initial, but overall she is doing okay.  She has had more down days recently.  She sleeps well.  Overall, her focus is to maintain.  She is trying to keep the negative out of her life.    Expected Outcomes Short: Continue to focus on the positive  Long: Continue to cope and learn on church community.    Interventions Stress management education;Encouraged to attend Cardiac Rehabilitation for the exercise    Continue Psychosocial Services  Follow up required by staff           Vocational Rehabilitation: Provide vocational rehab assistance to qualifying candidates.   Vocational Rehab Evaluation & Intervention:  Vocational Rehab - 04/23/20 1116      Initial Vocational Rehab Evaluation & Intervention   Assessment shows need for Vocational Rehabilitation No           Education: Education Goals: Education classes will be provided on a variety of topics geared toward better  understanding of heart health and risk factor modification. Participant will state understanding/return demonstration of topics presented as noted by education test scores.  Learning Barriers/Preferences:  Learning Barriers/Preferences - 04/23/20 1116      Learning Barriers/Preferences   Learning Barriers None    Learning Preferences None           General Cardiac Education Topics:  AED/CPR: - Group verbal and written instruction with the use of models to demonstrate the basic use of the AED with the basic ABC's of resuscitation.   Anatomy & Physiology of the Heart: - Group verbal and written instruction and models provide basic cardiac anatomy and physiology, with the coronary electrical and arterial systems. Review of Valvular disease and Heart Failure   Cardiac Procedures: - Group verbal and written instruction to review commonly prescribed medications for heart disease. Reviews the medication, class of the drug, and side effects. Includes the steps to properly store meds and maintain the prescription regimen. (  beta blockers and nitrates)   Cardiac Medications I: - Group verbal and written instruction to review commonly prescribed medications for heart disease. Reviews the medication, class of the drug, and side effects. Includes the steps to properly store meds and maintain the prescription regimen.   Cardiac Medications II: -Group verbal and written instruction to review commonly prescribed medications for heart disease. Reviews the medication, class of the drug, and side effects. (all other drug classes)    Go Sex-Intimacy & Heart Disease, Get SMART - Goal Setting: - Group verbal and written instruction through game format to discuss heart disease and the return to sexual intimacy. Provides group verbal and written material to discuss and apply goal setting through the application of the S.M.A.R.T. Method.   Other Matters of the Heart: - Provides group verbal, written  materials and models to describe Stable Angina and Peripheral Artery. Includes description of the disease process and treatment options available to the cardiac patient.   Infection Prevention: - Provides verbal and written material to individual with discussion of infection control including proper hand washing and proper equipment cleaning during exercise session.   Cardiac Rehab from 04/24/2020 in [facility] Cardiac and Pulmonary Rehab  Date 04/24/20  Educator [facility]  Instruction Review Code 1- Verbalizes Understanding      Falls Prevention: - Provides verbal and written material to individual with discussion of falls prevention and safety.   Cardiac Rehab from 04/24/2020 in [facility] Cardiac and Pulmonary Rehab  Date 04/24/20  Educator [facility]  Instruction Review Code 1- Verbalizes Understanding      Other: -Provides group and verbal instruction on various topics (see comments)   Knowledge Questionnaire Score:  Knowledge Questionnaire Score - 04/24/20 1017      Knowledge Questionnaire Score   Pre Score 22/26 Education Focus: MI, NTG, [place], Exercise           Core Components/Risk Factors/Patient Goals at Admission:  Personal Goals and Risk Factors at Admission - 04/24/20 1018      Core Components/Risk Factors/Patient Goals on Admission    Weight Management Yes;Weight Loss    Intervention Weight Management: Develop a combined nutrition and exercise program designed to reach desired caloric intake, while maintaining appropriate intake of nutrient and fiber, sodium and fats, and appropriate energy expenditure required for the weight goal.;Weight Management: Provide education and appropriate resources to help participant work on and attain dietary goals.    Admit Weight 170 lb 12.8 oz (77.5 kg)    Goal Weight: Short Term 165 lb (74.8 kg)    Goal Weight: Long Term 160 lb (72.6 kg)    Expected Outcomes Short Term: Continue to assess and modify interventions until short term weight is  achieved;Long Term: Adherence to nutrition and physical activity/exercise program aimed toward attainment of established weight goal;Weight Loss: Understanding of general recommendations for a balanced deficit meal plan, which promotes 1-2 lb weight loss per week and includes a negative energy balance of [phone] kcal/d;Understanding recommendations for meals to include 15-35% energy as protein, 25-35% energy from fat, 35-60% energy from carbohydrates, less than 245m of dietary cholesterol, 20-35 gm of total fiber daily;Understanding of distribution of calorie intake throughout the day with the consumption of 4-5 meals/snacks    Heart Failure Yes    Intervention Provide a combined exercise and nutrition program that is supplemented with education, support and counseling about heart failure. Directed toward relieving symptoms such as shortness of breath, decreased exercise tolerance, and extremity edema.    Expected Outcomes Improve functional  capacity of life;Short term: Attendance in program 2-3 days a week with increased exercise capacity. Reported lower sodium intake. Reported increased fruit and vegetable intake. Reports medication compliance.;Short term: Daily weights obtained and reported for increase. Utilizing diuretic protocols set by physician.;Long term: Adoption of self-care skills and reduction of barriers for early signs and symptoms recognition and intervention leading to self-care maintenance.    Lipids Yes    Intervention Provide education and support for participant on nutrition & aerobic/resistive exercise along with prescribed medications to achieve LDL <65m, HDL >493m    Expected Outcomes Short Term: Participant states understanding of desired cholesterol values and is compliant with medications prescribed. Participant is following exercise prescription and nutrition guidelines.;Long Term: Cholesterol controlled with medications as prescribed, with individualized exercise RX and with  personalized nutrition plan. Value goals: LDL < 7065mHDL > 40 mg.           Education:Diabetes - Individual verbal and written instruction to review signs/symptoms of diabetes, desired ranges of glucose level fasting, after meals and with exercise. Acknowledge that pre and post exercise glucose checks will be done for 3 sessions at entry of program.   Education: Know Your Numbers and Risk Factors: -Group verbal and written instruction about important numbers in your health.  Discussion of what are risk factors and how they play a role in the disease process.  Review of Cholesterol, Blood Pressure, Diabetes, and BMI and the role they play in your overall health.   Core Components/Risk Factors/Patient Goals Review:   Goals and Risk Factor Review    Row Name 06/09/20 1520 06/23/20 1511           Core Components/Risk Factors/Patient Goals Review   Personal Goals Review Weight Management/Obesity;Heart Failure;Lipids Weight Management/Obesity;Heart Failure;Lipids      Review Hannah Martinez doing well in rehab.  Her weight is up and down, but she tries not to go over 180 lbs.  She denies heart failure symptoms.  She will occassionally check her pressure at home, but not since coming to class. Overall, she is doing well. Hannah Martinez doing well in rehab.  Her BMI and weight was up some, but her doctor thinks it is fluid build up, so she has started lasix twice a day to help get the weight back down.  She denies any other heart failure symptoms. Reviewed heart failure zone action plan with her.  Blood pressures have been good, even possibly on low side.  She is doing well with her medications.      Expected Outcomes Short: Conitnue to work on weight loss Long; Continue to monitor risk factors. Short: Continue to work on weight loss and get fluid off Long; Continue to monitor risk factors.             Core Components/Risk Factors/Patient Goals at Discharge (Final Review):   Goals and Risk Factor Review -  06/23/20 1511      Core Components/Risk Factors/Patient Goals Review   Personal Goals Review Weight Management/Obesity;Heart Failure;Lipids    Review Hannah Martinez doing well in rehab.  Her BMI and weight was up some, but her doctor thinks it is fluid build up, so she has started lasix twice a day to help get the weight back down.  She denies any other heart failure symptoms. Reviewed heart failure zone action plan with her.  Blood pressures have been good, even possibly on low side.  She is doing well with her medications.    Expected Outcomes Short: Continue to  work on weight loss and get fluid off Long; Continue to monitor risk factors.           ITP Comments:  ITP Comments    Row Name 04/23/20 1124 04/24/20 1009 04/28/20 1514 05/14/20 0620 06/11/20 0610   ITP Comments Virtual Orientation completed today. HAs EP eval and Gym Orientation scheduled for tomorrow am. Documentation of diagnosis can be found in [facility] 02/24/2020. Completed 6MWT and gym orientation.  Initial ITP created and sent for review to Dr. Emily Filbert, Medical Director. First full day of exercise!  Patient was oriented to gym and equipment including functions, settings, policies, and procedures.  Patient's individual exercise prescription and treatment plan were reviewed.  All starting workloads were established based on the results of the 6 minute walk test done at initial orientation visit.  The plan for exercise progression was also introduced and progression will be customized based on patient's performance and goals. 30 Day review completed. ITP review done, changes made as directed,and approval shown by signature of  Scientist, research (life sciences). New to program 30 Day review completed. Medical Director ITP review done, changes made as directed, and signed approval by Medical Director.   [place] Name 07/09/20 0927           ITP Comments 30 Day review completed. Medical Director ITP review done, changes made as directed, and signed approval  by Medical Director.              Comments:

## 2020-07-09 NOTE — Progress Notes (Signed)
Daily Session Note  Patient Details  Name: Phylis Javed MRN: [id number] Date of Birth: 1956-09-04 Referring Provider:     Cardiac Rehab from 04/24/2020 in [facility] Cardiac and Pulmonary Rehab  Referring Provider Ida Rogue MD      Encounter Date: 07/09/2020  Check In:  Session Check In - 07/09/20 1552      Check-In   Supervising physician immediately available to respond to emergencies See telemetry face sheet for immediately available ER MD    Location [facility]-Cardiac & Pulmonary Rehab    Staff Present Renita Papa, RN BSN;Joseph Lou Miner, [place] Exercise Physiologist    Virtual Visit No    Medication changes reported     No    Fall or balance concerns reported    No    Warm-up and Cool-down Performed on first and last piece of equipment    Resistance Training Performed Yes    VAD Patient? No    PAD/SET Patient? No      Pain Assessment   Currently in Pain? No/denies              Social History   Tobacco Use  Smoking Status Never Smoker  Smokeless Tobacco Never Used    Goals Met:  Independence with exercise equipment Exercise tolerated well No report of cardiac concerns or symptoms Strength training completed today  Goals Unmet:  Not Applicable  Comments: Pt able to follow exercise prescription today without complaint.  Will continue to monitor for progression.    Dr. Emily Filbert is Medical Director for [place] and [facility].

## 2020-07-10 ENCOUNTER — Encounter: Payer: Medicare Other | Admitting: *Deleted

## 2020-07-10 ENCOUNTER — Other Ambulatory Visit: Payer: Self-pay

## 2020-07-10 DIAGNOSIS — I5042 Chronic combined systolic (congestive) and diastolic (congestive) heart failure: Secondary | ICD-10-CM | POA: Diagnosis not present

## 2020-07-10 NOTE — Progress Notes (Signed)
Daily Session Note  Patient Details  Name: Oretta Berkland MRN: [id number] Date of Birth: 12-08-1956 Referring Provider:     Cardiac Rehab from 04/24/2020 in [facility] Cardiac and Pulmonary Rehab  Referring Provider Ida Rogue MD      Encounter Date: 07/10/2020  Check In:  Session Check In - 07/10/20 1531      Check-In   Supervising physician immediately available to respond to emergencies See telemetry face sheet for immediately available ER MD    Location [facility]-Cardiac & Pulmonary Rehab    Staff Present Renita Papa, RN BSN;Joseph Hood RCP,RRT,BSRT;Laureen [place], [place], RRT, CPFT    Virtual Visit No    Medication changes reported     No    Fall or balance concerns reported    No    Warm-up and Cool-down Performed on first and last piece of equipment    Resistance Training Performed Yes    VAD Patient? No    PAD/SET Patient? No      Pain Assessment   Currently in Pain? No/denies              Social History   Tobacco Use  Smoking Status Never Smoker  Smokeless Tobacco Never Used    Goals Met:  Independence with exercise equipment Exercise tolerated well No report of cardiac concerns or symptoms Strength training completed today  Goals Unmet:  Not Applicable  Comments: Pt able to follow exercise prescription today without complaint.  Will continue to monitor for progression.    Dr. Emily Filbert is Medical Director for [place] and [facility].

## 2020-07-14 ENCOUNTER — Other Ambulatory Visit: Payer: Self-pay

## 2020-07-14 ENCOUNTER — Encounter: Payer: Medicare Other | Admitting: *Deleted

## 2020-07-14 DIAGNOSIS — I5042 Chronic combined systolic (congestive) and diastolic (congestive) heart failure: Secondary | ICD-10-CM | POA: Diagnosis not present

## 2020-07-14 NOTE — Progress Notes (Signed)
Daily Session Note  Patient Details  Name: Hannah Martinez MRN: [id number] Date of Birth: Oct 23, 1956 Referring Provider:     Cardiac Rehab from 04/24/2020 in [facility] Cardiac and Pulmonary Rehab  Referring Provider Ida Rogue MD      Encounter Date: 07/14/2020  Check In:  Session Check In - 07/14/20 1538      Check-In   Supervising physician immediately available to respond to emergencies See telemetry face sheet for immediately available ER MD    Location [facility]-Cardiac & Pulmonary Rehab    Staff Present Renita Papa, RN Margurite Auerbach, MS Exercise Physiologist;Kelly Amedeo Plenty, BS, ACSM CEP, Exercise Physiologist    Virtual Visit No    Medication changes reported     No    Fall or balance concerns reported    No    Warm-up and Cool-down Performed on first and last piece of equipment    Resistance Training Performed Yes    VAD Patient? No    PAD/SET Patient? No      Pain Assessment   Currently in Pain? No/denies              Social History   Tobacco Use  Smoking Status Never Smoker  Smokeless Tobacco Never Used    Goals Met:  Independence with exercise equipment Exercise tolerated well No report of cardiac concerns or symptoms Strength training completed today  Goals Unmet:  Not Applicable  Comments: Pt able to follow exercise prescription today without complaint.  Will continue to monitor for progression.    Dr. Emily Filbert is Medical Director for [place] and [facility].

## 2020-07-17 ENCOUNTER — Other Ambulatory Visit: Payer: Self-pay

## 2020-07-17 ENCOUNTER — Encounter: Payer: Medicare Other | Admitting: *Deleted

## 2020-07-17 DIAGNOSIS — I5042 Chronic combined systolic (congestive) and diastolic (congestive) heart failure: Secondary | ICD-10-CM | POA: Diagnosis not present

## 2020-07-17 NOTE — Progress Notes (Signed)
Daily Session Note  Patient Details  Name: Hannah Martinez MRN: [id number] Date of Birth: Jan 10, 1956 Referring Provider:     Cardiac Rehab from 04/24/2020 in [facility] Cardiac and Pulmonary Rehab  Referring Provider Ida Rogue MD      Encounter Date: 07/17/2020  Check In:  Session Check In - 07/17/20 1549      Check-In   Supervising physician immediately available to respond to emergencies See telemetry face sheet for immediately available ER MD    Location [facility]-Cardiac & Pulmonary Rehab    Staff Present Renita Papa, RN BSN;Joseph Hood RCP,RRT,BSRT;Amanda Oletta Darter, [place], ACSM CEP, Exercise Physiologist    Virtual Visit No    Medication changes reported     No    Fall or balance concerns reported    No    Warm-up and Cool-down Performed on first and last piece of equipment    Resistance Training Performed Yes    VAD Patient? No    PAD/SET Patient? No      Pain Assessment   Currently in Pain? No/denies              Social History   Tobacco Use  Smoking Status Never Smoker  Smokeless Tobacco Never Used    Goals Met:  Independence with exercise equipment Exercise tolerated well No report of cardiac concerns or symptoms Strength training completed today  Goals Unmet:  Not Applicable  Comments: Pt able to follow exercise prescription today without complaint.  Will continue to monitor for progression.    Dr. Emily Filbert is Medical Director for [place] and [facility].

## 2020-07-21 ENCOUNTER — Other Ambulatory Visit: Payer: Self-pay | Admitting: Family

## 2020-07-21 ENCOUNTER — Other Ambulatory Visit: Payer: Self-pay

## 2020-07-21 ENCOUNTER — Encounter: Payer: Medicare Other | Admitting: *Deleted

## 2020-07-21 DIAGNOSIS — I5042 Chronic combined systolic (congestive) and diastolic (congestive) heart failure: Secondary | ICD-10-CM | POA: Diagnosis not present

## 2020-07-21 NOTE — Progress Notes (Signed)
Daily Session Note  Patient Details  Name: Norely Schlick MRN: [id number] Date of Birth: 09/29/1956 Referring Provider:     Cardiac Rehab from 04/24/2020 in [facility] Cardiac and Pulmonary Rehab  Referring Provider Ida Rogue MD      Encounter Date: 07/21/2020  Check In:  Session Check In - 07/21/20 1528      Check-In   Supervising physician immediately available to respond to emergencies See telemetry face sheet for immediately available ER MD    Location [facility]-Cardiac & Pulmonary Rehab    Staff Present Renita Papa, RN Moises Blood, BS, ACSM CEP, Exercise Physiologist;Jessica Luan Pulling, MA, RCEP, CCRP, CCET    Virtual Visit No    Medication changes reported     No    Fall or balance concerns reported    No    Warm-up and Cool-down Performed on first and last piece of equipment    Resistance Training Performed Yes    VAD Patient? No    PAD/SET Patient? No      Pain Assessment   Currently in Pain? No/denies              Social History   Tobacco Use  Smoking Status Never Smoker  Smokeless Tobacco Never Used    Goals Met:  Independence with exercise equipment Exercise tolerated well No report of cardiac concerns or symptoms Strength training completed today  Goals Unmet:  Not Applicable  Comments: Pt able to follow exercise prescription today without complaint.  Will continue to monitor for progression.    Dr. Emily Filbert is Medical Director for [place] and [facility].

## 2020-07-23 ENCOUNTER — Encounter: Payer: Medicare Other | Admitting: *Deleted

## 2020-07-23 ENCOUNTER — Other Ambulatory Visit: Payer: Self-pay

## 2020-07-23 ENCOUNTER — Ambulatory Visit (INDEPENDENT_AMBULATORY_CARE_PROVIDER_SITE_OTHER): Payer: Medicare Other

## 2020-07-23 DIAGNOSIS — I5042 Chronic combined systolic (congestive) and diastolic (congestive) heart failure: Secondary | ICD-10-CM | POA: Diagnosis not present

## 2020-07-23 LAB — ECHOCARDIOGRAM COMPLETE
AR max vel: 2.56 cm2
AV Area VTI: 2.32 cm2
AV Area mean vel: 2.3 cm2
AV Mean grad: 2 mmHg
AV Peak grad: 3.2 mmHg
Ao pk vel: 0.9 m/s
Area-P 1/2: 3.85 cm2
S' Lateral: 3.9 cm
Single Plane A4C EF: 28.8 %

## 2020-07-23 MED ORDER — PERFLUTREN LIPID MICROSPHERE
1.0000 mL | INTRAVENOUS | Status: AC | PRN
  Administered 2020-07-23: 2 mL via INTRAVENOUS

## 2020-07-23 NOTE — Progress Notes (Signed)
Daily Session Note  Patient Details  Name: Rai Sinagra MRN: [id number] Date of Birth: 1956/05/06 Referring Provider:     Cardiac Rehab from 04/24/2020 in [facility] Cardiac and Pulmonary Rehab  Referring Provider Ida Rogue MD      Encounter Date: 07/23/2020  Check In:  Session Check In - 07/23/20 [place]      Check-In   Supervising physician immediately available to respond to emergencies See telemetry face sheet for immediately available ER MD    Location [facility]-Cardiac & Pulmonary Rehab    Staff Present Renita Papa, RN BSN;Joseph Hood RCP,RRT,BSRT;Laureen Owens Shark, BS, RRT, CPFT;Amanda Oletta Darter, [place], ACSM CEP, Exercise Physiologist    Virtual Visit No    Medication changes reported     No    Fall or balance concerns reported    No    Warm-up and Cool-down Performed on first and last piece of equipment    Resistance Training Performed Yes    VAD Patient? No    PAD/SET Patient? No      Pain Assessment   Currently in Pain? No/denies              Social History   Tobacco Use  Smoking Status Never Smoker  Smokeless Tobacco Never Used    Goals Met:  Independence with exercise equipment Exercise tolerated well No report of cardiac concerns or symptoms Strength training completed today  Goals Unmet:  Not Applicable  Comments: Pt able to follow exercise prescription today without complaint.  Will continue to monitor for progression.    Dr. Emily Filbert is Medical Director for [place] and [facility].

## 2020-07-24 ENCOUNTER — Telehealth: Payer: Self-pay | Admitting: *Deleted

## 2020-07-24 DIAGNOSIS — I2510 Atherosclerotic heart disease of native coronary artery without angina pectoris: Secondary | ICD-10-CM

## 2020-07-24 NOTE — Telephone Encounter (Signed)
Left voicemail message to call back  

## 2020-07-24 NOTE — Telephone Encounter (Signed)
-----   Message from Alver Sorrow, NP sent at 07/24/2020  1:15 PM EDT ----- Heart pumping function previously 20-25% now 25-30%. Still not at goal, but mildly improved. Mitral valve no longer leaky. Her heart is less stiff than prior study (was grade 2 diastolic dysfunction, now grade 1).   We will continue to optimize her medications. Stop Losartan. Start Entresto 24-26mg  twice daily. Reduce Lasix to 40mg  daily. Repeat BMP in 2 weeks.   Of note, may need to refer to EP in future for ICD but will optimize medical therapies first. Discussed with Dr. .

## 2020-07-25 MED ORDER — ENTRESTO 24-26 MG PO TABS: 1.0000 | ORAL_TABLET | Freq: Two times a day (BID) | ORAL | 3 refills | Status: DC

## 2020-07-25 MED ORDER — FUROSEMIDE 40 MG PO TABS: 40.0000 mg | ORAL_TABLET | Freq: Every day | ORAL | 3 refills | Status: DC

## 2020-07-25 NOTE — Telephone Encounter (Signed)
No answer. Left message to call back.   

## 2020-07-25 NOTE — Telephone Encounter (Signed)
Spoke with patient and reviewed results and recommendations. Spelled medications and instructions to stop losartan and then start Entresto twice a day. Spoke with provider to clarify if she should wait 3 days before starting Entresto but stated that was not necessary. Then reviewed she would need labs in 2 weeks over at the [facility] entrance. She verbalized understanding of our conversation, agreement with plan, and had no further questions.

## 2020-07-25 NOTE — Telephone Encounter (Signed)
Left voicemail message.

## 2020-07-25 NOTE — Telephone Encounter (Signed)
Spoke with patient to clarify that she stops losartan then start Entresto next day. She was glad I called back and left message because she did want to review instructions again. Slowly addressed instructions as below and she verbalized understanding with no further questions at this time.  1. STOP Losartan 2. START Entresto 24/26 mg twice a day 3. LABS in 2 weeks at [facility] with no appointment needed.

## 2020-07-28 NOTE — Telephone Encounter (Signed)
Patient returning call.

## 2020-07-28 NOTE — Telephone Encounter (Signed)
Returned the patient's call lmtcb if assistance is still needed. 

## 2020-07-31 ENCOUNTER — Encounter: Payer: Medicare Other | Attending: Cardiovascular Disease

## 2020-07-31 DIAGNOSIS — I5042 Chronic combined systolic (congestive) and diastolic (congestive) heart failure: Secondary | ICD-10-CM | POA: Insufficient documentation

## 2020-08-04 ENCOUNTER — Other Ambulatory Visit: Payer: Self-pay

## 2020-08-04 ENCOUNTER — Other Ambulatory Visit
Admission: RE | Admit: 2020-08-04 | Discharge: 2020-08-04 | Disposition: A | Discharge status: Home / Self Care | Payer: Medicare Other | Attending: Family | Admitting: Family

## 2020-08-04 ENCOUNTER — Encounter: Payer: Medicare Other | Admitting: *Deleted

## 2020-08-04 DIAGNOSIS — I2510 Atherosclerotic heart disease of native coronary artery without angina pectoris: Secondary | ICD-10-CM | POA: Diagnosis present

## 2020-08-04 DIAGNOSIS — I5042 Chronic combined systolic (congestive) and diastolic (congestive) heart failure: Secondary | ICD-10-CM

## 2020-08-04 LAB — BASIC METABOLIC PANEL
Anion gap: 4 — ABNORMAL LOW (ref 5–15)
BUN: 13 mg/dL (ref 8–23)
CO2: 23 mmol/L (ref 22–32)
Calcium: 8.8 mg/dL — ABNORMAL LOW (ref 8.9–10.3)
Chloride: 114 mmol/L — ABNORMAL HIGH (ref 98–111)
Creatinine, Ser: 1.09 mg/dL — ABNORMAL HIGH (ref 0.44–1.00)
GFR calc Af Amer: >60 mL/min (ref >60)
GFR calc non Af Amer: 54 mL/min — ABNORMAL LOW (ref >60)
Glucose, Bld: 100 mg/dL — ABNORMAL HIGH (ref 70–99)
Potassium: 4.6 mmol/L (ref 3.5–5.1)
Sodium: 141 mmol/L (ref 135–145)

## 2020-08-04 NOTE — Progress Notes (Signed)
Daily Session Note  Patient Details  Name: Hannah Martinez MRN: [id number] Date of Birth: Jul 29, 1956 Referring Provider:     Cardiac Rehab from 04/24/2020 in [facility]  Referring Provider Ida Rogue MD      Encounter Date: 08/04/2020  Check In:  Session Check In - 08/04/20 1528      Check-In   Supervising physician immediately available to respond to emergencies See telemetry face sheet for immediately available ER MD    Location [facility]    Staff Present Renita Papa, RN Margurite Auerbach, MS Exercise Physiologist;Kelly Amedeo Plenty, BS, ACSM CEP, Exercise Physiologist    Virtual Visit No    Medication changes reported     No    Fall or balance concerns reported    No    Warm-up and Cool-down Performed on first and last piece of equipment    Resistance Training Performed Yes    VAD Patient? No    PAD/SET Patient? No      Pain Assessment   Currently in Pain? No/denies              Social History   Tobacco Use  Smoking Status Never Smoker  Smokeless Tobacco Never Used    Goals Met:  Independence with exercise equipment Exercise tolerated well No report of cardiac concerns or symptoms Strength training completed today  Goals Unmet:  Not Applicable  Comments: Pt able to follow exercise prescription today without complaint.  Will continue to monitor for progression.    Dr. Emily Filbert is Medical Director for [place] and [facility].

## 2020-08-05 ENCOUNTER — Telehealth: Payer: Self-pay | Admitting: Family

## 2020-08-05 NOTE — Telephone Encounter (Signed)
Patient calling in to make office aware that she went yesterday to complete her bloodwork

## 2020-08-05 NOTE — Telephone Encounter (Signed)
Noted. Encounter completed at this time.

## 2020-08-06 ENCOUNTER — Encounter: Payer: Self-pay | Admitting: *Deleted

## 2020-08-06 DIAGNOSIS — I5042 Chronic combined systolic (congestive) and diastolic (congestive) heart failure: Secondary | ICD-10-CM

## 2020-08-06 NOTE — Progress Notes (Signed)
Cardiac Individual Treatment Plan  Patient Details  Name: Hannah Martinez MRN: [id number] Date of Birth: 10-03-1956 Referring Provider:     Cardiac Rehab from 04/24/2020 in [facility] Cardiac [facility] Pulmonary Re[facility]  Referring Provider Ida Rogue MD      Initial Encounter Date:    Cardiac Rehab from 04/24/2020 in [facility] Cardiac and Pulmonary Rehab  Date 04/24/20      Visit Diagnosis: Heart failure, systolic and diastolic, chronic ([place])  Patient's Home Medications on Admission:  Current Outpatient Medications:  .  benzonatate (TESSALON PERLES) 100 MG capsule, Take 1 capsule (100 mg total) by mouth every 6 (six) hours as needed for cough., Disp: 30 capsule, Rfl: 0 .  [employer] ASPIRIN ADULT LOW DOSE 81 MG chewable tablet, CHEW 1 TABLET (81 MG TOTAL) BY MOUTH DAILY., Disp: 90 tablet, Rfl: 0 .  fluticasone (FLONASE) 50 MCG/ACT nasal spray, Place 1-2 sprays into both nostrils daily as needed for allergies., Disp: , Rfl:  .  furosemide (LASIX) 40 MG tablet, Take 1 tablet (40 mg total) by mouth daily., Disp: 90 tablet, Rfl: 3 .  gabapentin (NEURONTIN) 300 MG capsule, Take 300 mg by mouth 3 (three) times daily. (take each capsule with 631m tablet to equal 9068mthree times a day), Disp: , Rfl:  .  gabapentin (NEURONTIN) 600 MG tablet, Take 600 mg by mouth 3 (three) times daily. (take each tablet with 30060mapsule to equal 900m73mree times a day), Disp: , Rfl:  .  HYDROcodone-acetaminophen (NORCO/VICODIN) 5-325 MG tablet, Take 1 tablet by mouth every 6 (six) hours as needed for moderate pain., Disp: 10 tablet, Rfl: 0 .  meloxicam (MOBIC) 7.5 MG tablet, Take 7.5 mg by mouth daily., Disp: , Rfl:  .  metoprolol succinate (TOPROL-XL) 25 MG 24 hr tablet, Take 1 tablet (25 mg total) by mouth daily., Disp: 45 tablet, Rfl: 1 .  omeprazole (PRILOSEC) 20 MG capsule, Take 20 mg by mouth daily., Disp: , Rfl:  .  sacubitril-valsartan (ENTRESTO) 24-26 MG, Take 1 tablet by mouth 2 (two) times daily., Disp: 180 tablet,  Rfl: 3 .  simvastatin (ZOCOR) 40 MG tablet, TAKE 1 TABLET BY MOUTH EVERYDAY AT BEDTIME, Disp: 90 tablet, Rfl: 3 .  spironolactone (ALDACTONE) 25 MG tablet, Take 0.5 tablets (12.5 mg total) by mouth daily., Disp: 45 tablet, Rfl: 1  Past Medical History: Past Medical History:  Diagnosis Date  . Cerebral aneurysm   . Chronic pain   . Degenerative joint disease   . Hypercholesteremia   . Stroke (HCC)[place]. TMJ (dislocation of temporomandibular joint)     Tobacco Use: Social History   Tobacco Use  Smoking Status Never Smoker  Smokeless Tobacco Never Used    Labs: Recent Review [employer], physiologicalLabs for ITP Cardiac and Pulmonary Rehab Latest Ref Rng & Units 02/25/2020 06/17/2020   Cholestrol 100 - 199 mg/dL 181 178   LDLCALC 0 - 99 mg/dL 123(H) 102(H)   HDL >39 mg/dL 45 54   Trlycerides 0 - 149 mg/dL 64 122   Hemoglobin A1c 4.8 - 5.6 % 4.9 -       Exercise Target Goals: Exercise Program Goal: Individual exercise prescription set using results from initial 6 min walk test and THRR while considering  patient's activity barriers and safety.   Exercise Prescription Goal: Initial exercise prescription builds to 30-45 minutes a day of aerobic activity, 2-3 days per week.  Home exercise guidelines will be given to patient during program as part of  exercise prescription that the participant will acknowledge.   Education: Aerobic Exercise & Resistance Training: - Gives group verbal and written instruction on the various components of exercise. Focuses on aerobic and resistive training programs and the benefits of this training and how to safely progress through these programs..   Education: Exercise & Equipment Safety: - Individual verbal instruction and demonstration of equipment use and safety with use of the equipment.   Cardiac Rehab from 07/23/2020 in [facility] Cardiac and Pulmonary Rehab  Date 04/24/20  Educator [facility]  Instruction Review Code 1- Verbalizes Understanding       Education: Exercise Physiology & General Exercise Guidelines: - Group verbal and written instruction with models to review the exercise physiology of the cardiovascular system and associated critical values. Provides general exercise guidelines with specific guidelines to those with heart or lung disease.    Cardiac Rehab from 07/23/2020 in [facility] Cardiac and Pulmonary Rehab  Date 07/09/20  Educator AS  Instruction Review Code 1- Verbalizes Understanding      Education: Flexibility, Balance, Mind/Body Relaxation: Provides group verbal/written instruction on the benefits of flexibility and balance training, including mind/body exercise modes such as yoga, pilates and tai chi.  Demonstration and skill practice provided.   Activity Barriers & Risk Stratification:  Activity Barriers & Cardiac Risk Stratification - 04/24/20 1011      Activity Barriers & Cardiac Risk Stratification   Activity Barriers Arthritis;Other (comment);Deconditioning;Muscular Weakness;Assistive Device    Comments Left sided weakness post CVA 1999  2003 (very limited left arm use)    Cardiac Risk Stratification High           6 Minute Walk:  6 Minute Walk    Row Name 04/24/20 1009         6 Minute Walk   Phase Initial     Distance 574 feet     Walk Time 6 minutes     # of Rest Breaks 0     MPH 1.08     METS 2.25     RPE 9     VO2 Peak 7.86     Symptoms No     Resting HR 82 bpm     Resting BP 146/70     Resting Oxygen Saturation  99 %     Exercise Oxygen Saturation  during 6 min walk 99 %     Max Ex. HR 108 bpm     Max Ex. BP 146/74     2 Minute Post BP 132/60            Oxygen Initial Assessment:   Oxygen Re-Evaluation:   Oxygen Discharge (Final Oxygen Re-Evaluation):   Initial Exercise Prescription:  Initial Exercise Prescription - 04/24/20 1000      Date of Initial Exercise RX and Referring Provider   Date 04/24/20    Referring Provider Ida Rogue MD      Treadmill    MPH 0.7    Grade 0    Minutes 15    METs 1.5      NuStep   Level 1    SPM 80    Minutes 15    METs 1      Biostep-RELP   Level 1    SPM 50    Minutes 15    METs 1      Prescription Details   Frequency (times per week) 3    Duration Progress to 30 minutes of continuous aerobic without signs/symptoms of physical distress  Intensity   THRR 40-80% of Max Heartrate 112-143    Ratings of Perceived Exertion 11-13    Perceived Dyspnea 0-4      Progression   Progression Continue to progress workloads to maintain intensity without signs/symptoms of physical distress.      Resistance Training   Training Prescription Yes    Weight 3 lb    Reps 10-15           Perform Capillary Blood Glucose checks as needed.  Exercise Prescription Changes:  Exercise Prescription Changes    Row Name 04/24/20 1000 05/06/20 1500 05/22/20 1500 06/05/20 1400 06/16/20 1500     Response to Exercise   Blood Pressure (Admit) 146/70 124/70 98/5 90/58 102/60   Blood Pressure (Exercise) 146/74 140/72 124/70 110/58 118/68   Blood Pressure (Exit) 132/60 100/60 106/58 88/56 112/70   Heart Rate (Admit) 82 bpm 66 bpm 76 bpm 75 bpm 90 bpm   Heart Rate (Exercise) 108 bpm 100 bpm 102 bpm 93 bpm 91 bpm   Heart Rate (Exit) 85 bpm 107 bpm 85 bpm 77 bpm 88 bpm   Oxygen Saturation (Admit) 99 % -- -- -- --   Oxygen Saturation (Exercise) 99 % -- -- -- --   Rating of Perceived Exertion (Exercise) 9 11 13 11 13    Symptoms limps from strokes -- none none none   Comments walk test results -- -- -- --   Duration -- -- Continue with 30 min of aerobic exercise without signs/symptoms of physical distress. Continue with 30 min of aerobic exercise without signs/symptoms of physical distress. Continue with 30 min of aerobic exercise without signs/symptoms of physical distress.   Intensity -- -- THRR unchanged THRR unchanged THRR unchanged     Progression   Progression -- -- Continue to progress workloads to maintain  intensity without signs/symptoms of physical distress. Continue to progress workloads to maintain intensity without signs/symptoms of physical distress. Continue to progress workloads to maintain intensity without signs/symptoms of physical distress.   Average METs -- -- 2.03 2.5 2.65     Resistance Training   Training Prescription -- -- Yes Yes Yes   Weight -- -- 3 lb 3 lb 3 lb   Reps -- -- 10-15 10-15 10-15     Interval Training   Interval Training -- -- No No No     Treadmill   MPH -- -- 0.7 -- --   Grade -- -- 0 -- --   Minutes -- -- 3 -- --   METs -- -- 1.5 -- --     NuStep   Level -- 2 3 3 4    SPM -- 80 -- -- --   Minutes -- 15 15 15 15    METs -- 2.1 2.6 2 2.9     Biostep-RELP   Level -- 1 3 3  --   SPM -- 50 -- -- --   Minutes -- 15 15 15  --   METs -- 2 2 3  --     Home Exercise Plan   Plans to continue exercise at -- -- -- -- Home (comment)  walking   Frequency -- -- -- -- Add 2 additional days to program exercise sessions.   Initial Home Exercises Provided -- -- -- -- 06/09/20   Row Name 07/02/20 1500 07/14/20 1400 07/29/20 1300         Response to Exercise   Blood Pressure (Admit) 102/66 104/78 98/54     Blood Pressure (Exercise) 144/66 130/70 106/60  Blood Pressure (Exit) 90/54 100/60 98/60     Heart Rate (Admit) 87 bpm 74 bpm 75 bpm     Heart Rate (Exercise) 106 bpm 99 bpm 92 bpm     Heart Rate (Exit) 82 bpm 83 bpm 68 bpm     Rating of Perceived Exertion (Exercise) 12 13 12      Symptoms none none none     Duration Continue with 30 min of aerobic exercise without signs/symptoms of physical distress. Continue with 30 min of aerobic exercise without signs/symptoms of physical distress. Continue with 30 min of aerobic exercise without signs/symptoms of physical distress.     Intensity THRR unchanged THRR unchanged THRR unchanged       Progression   Progression Continue to progress workloads to maintain intensity without signs/symptoms of physical distress.  Continue to progress workloads to maintain intensity without signs/symptoms of physical distress. Continue to progress workloads to maintain intensity without signs/symptoms of physical distress.     Average METs 3.3 2.5 2.7       Resistance Training   Training Prescription Yes Yes Yes     Weight 3 lb 3 lb 3 lb     Reps 10-15 10-15 10-15       Interval Training   Interval Training No No No       NuStep   Level 2 3 3      SPM 80 -- 80     Minutes 15 30 15      METs 3.3 2.6 2.1       Home Exercise Plan   Plans to continue exercise at Home (comment)  walking Home (comment)  walking Home (comment)  walking     Frequency Add 2 additional days to program exercise sessions. Add 2 additional days to program exercise sessions. Add 2 additional days to program exercise sessions.     Initial Home Exercises Provided 06/09/20 06/09/20 06/09/20            Exercise Comments:   Exercise Goals and Review:  Exercise Goals    Row Name 04/24/20 1016             Exercise Goals   Increase Physical Activity Yes       Intervention Provide advice, education, support and counseling about physical activity/exercise needs.;Develop an individualized exercise prescription for aerobic and resistive training based on initial evaluation findings, risk stratification, comorbidities and participant's personal goals.       Expected Outcomes Short Term: Attend rehab on a regular basis to increase amount of physical activity.;Long Term: Add in home exercise to make exercise part of routine and to increase amount of physical activity.;Long Term: Exercising regularly at least 3-5 days a week.       Increase Strength and Stamina Yes       Intervention Provide advice, education, support and counseling about physical activity/exercise needs.;Develop an individualized exercise prescription for aerobic and resistive training based on initial evaluation findings, risk stratification, comorbidities and participant's  personal goals.       Expected Outcomes Short Term: Increase workloads from initial exercise prescription for resistance, speed, and METs.;Short Term: Perform resistance training exercises routinely during rehab and add in resistance training at home;Long Term: Improve cardiorespiratory fitness, muscular endurance and strength as measured by increased METs and functional capacity (6MWT)       Able to understand and use rate of perceived exertion (RPE) scale Yes       Intervention Provide education and explanation on how to use RPE  scale       Expected Outcomes Short Term: Able to use RPE daily in rehab to express subjective intensity level;Long Term:  Able to use RPE to guide intensity level when exercising independently       Able to understand and use Dyspnea scale Yes       Intervention Provide education and explanation on how to use Dyspnea scale       Expected Outcomes Short Term: Able to use Dyspnea scale daily in rehab to express subjective sense of shortness of breath during exertion;Long Term: Able to use Dyspnea scale to guide intensity level when exercising independently       Knowledge and understanding of Target Heart Rate Range (THRR) Yes       Intervention Provide education and explanation of THRR including how the numbers were predicted and where they are located for reference       Expected Outcomes Short Term: Able to state/look up THRR;Short Term: Able to use daily as guideline for intensity in rehab;Long Term: Able to use THRR to govern intensity when exercising independently       Able to check pulse independently Yes       Intervention Provide education and demonstration on how to check pulse in carotid and radial arteries.;Review the importance of being able to check your own pulse for safety during independent exercise       Expected Outcomes Short Term: Able to explain why pulse checking is important during independent exercise;Long Term: Able to check pulse independently and  accurately       Understanding of Exercise Prescription Yes       Intervention Provide education, explanation, and written materials on patient's individual exercise prescription       Expected Outcomes Short Term: Able to explain program exercise prescription;Long Term: Able to explain home exercise prescription to exercise independently              Exercise Goals Re-Evaluation :  Exercise Goals Re-Evaluation    Row Name 04/28/20 1515 05/06/20 1514 05/22/20 1515 06/05/20 1411 06/09/20 1512     Exercise Goal Re-Evaluation   Exercise Goals Review Increase Physical Activity;Able to understand and use rate of perceived exertion (RPE) scale;Knowledge and understanding of Target Heart Rate Range (THRR);Understanding of Exercise Prescription;Increase Strength and Stamina;Able to check pulse independently Increase Physical Activity;Able to understand and use rate of perceived exertion (RPE) scale;Knowledge and understanding of Target Heart Rate Range (THRR);Understanding of Exercise Prescription;Increase Strength and Stamina;Able to check pulse independently Increase Physical Activity;Increase Strength and Stamina;Understanding of Exercise Prescription Increase Physical Activity;Increase Strength and Stamina;Understanding of Exercise Prescription Increase Physical Activity;Increase Strength and Stamina;Understanding of Exercise Prescription   Comments Reviewed RPE and dyspnea scales, THR and program prescription with pt today.  Pt voiced understanding and was given a copy of goals to take home. Hannah Martinez has tolerated exercise well.  She was able to walk on TM for a few minutes.  Staff will monito progress. Hannah Martinez is doing well in rehab.  She is still fearful of the treadmill, and has been averaging around 3 min on there.  She is up to level 3 on BioStep and NuStep.  We will continue to monitor her progress. Hannah Martinez has been doing well in rehab. She has not been on the treadmill recently.  We will encourage moving up  workloads.  We will continue to montior her progress. Hannah Martinez is doing well in rehab.  She is walking in her parking lot  at home for about  20 min.  We talked about increasing to 30 min. Reviewed home exercise with pt today.  Pt plans to walk longer and staff videos for exercise.  Reviewed THR, pulse, RPE, sign and symptoms, pulse oximetery and when to call 911 or MD.  Also discussed weather considerations and indoor options.  Pt voiced understanding.   Expected Outcomes Short: Use RPE daily to regulate intensity. Long: Follow program prescription in THR. Short: attend program session sconsistently Long:  increase stamina Short: return to regular attendance  Long: Continue to improve stamina. Short: Increase workloads Long: Continue to improve stamina. Short: Increase walking time at home Long: Continue to improve stamina.   [place] Name 06/16/20 1534 06/23/20 1507 07/02/20 1558 07/14/20 1448 07/21/20 1540     Exercise Goal Re-Evaluation   Exercise Goals Review Increase Physical Activity;Increase Strength and Stamina;Understanding of Exercise Prescription Increase Physical Activity;Increase Strength and Stamina;Understanding of Exercise Prescription Increase Physical Activity;Increase Strength and Stamina;Understanding of Exercise Prescription Increase Physical Activity;Increase Strength and Stamina;Understanding of Exercise Prescription Increase Physical Activity;Increase Strength and Stamina;Understanding of Exercise Prescription   Comments Hannah Martinez continues to do well in rehab. She is still avoiding the treadmill as she builds up her nerves to try again.  She is up to level 4 on the NuStep and we continue to strap her foot.  We will continue to monitor her progress. Hannah Martinez is doing well in rehab.  She is walking on her off days at home in the parking lot for about 20-35mn. She is also doing her weights on her good.  Her strength and stamina are improving.  She is now able to go shopping again. KWendelyn Breslowhas had success using  the NS with the leg stabilizer attachment.  She has been able to increase MET levels.  Staff willmonitor progress. KWendelyn Breslowis doing well in rehab.  She is now up to level 3 on the NuStep and able to maintain pressure on her bad leg.  We will continue to monitor her progress. KWendelyn Breslowis doing well in rehab.  She is feeling better overall.  She is walking on her off days and getting in 5 laps now at home!  She is also able to tell the difference in her strength and stamina and her good leg is getting stronger.   Expected Outcomes Short: Continue to build up courage for treadmill  Long: Continue to improve stamina. Short: Continue to walk for at least 30 min on off days  Long: Continue improve stamina and strength. Short: continue current plan Long: improve stamina Short: Continue to increase workloads Long; Continue to build stamina Short: Continue to attend regularly Long: Continue to exercise to build strength.   [place] 07/29/20 1325             Exercise Goal Re-Evaluation   Exercise Goals Review Increase Physical Activity;Increase Strength and Stamina;Understanding of Exercise Prescription       Comments KWendelyn Breslowcontinues to do well with exercise.  She can do the Nustep and XR without trouble.  We will continue to monitor pogress.  She has missed a couple days this week due to transportation.       Expected Outcomes Short: work on increasing levels on machines Long: increase stamina              Discharge Exercise Prescription (Final Exercise Prescription Changes):  Exercise Prescription Changes - 07/29/20 1300      Response to Exercise   Blood Pressure (Admit) 98/54    Blood Pressure (Exercise) 106/60  Blood Pressure (Exit) 98/60    Heart Rate (Admit) 75 bpm    Heart Rate (Exercise) 92 bpm    Heart Rate (Exit) 68 bpm    Rating of Perceived Exertion (Exercise) 12    Symptoms none    Duration Continue with 30 min of aerobic exercise without signs/symptoms of physical distress.    Intensity THRR  unchanged      Progression   Progression Continue to progress workloads to maintain intensity without signs/symptoms of physical distress.    Average METs 2.7      Resistance Training   Training Prescription Yes    Weight 3 lb    Reps 10-15      Interval Training   Interval Training No      NuStep   Level 3    SPM 80    Minutes 15    METs 2.1      Home Exercise Plan   Plans to continue exercise at Home (comment)   walking   Frequency Add 2 additional days to program exercise sessions.    Initial Home Exercises Provided 06/09/20           Nutrition:  Target Goals: Understanding of nutrition guidelines, daily intake of sodium <1566m, cholesterol <2061m calories 30% from fat and 7% or less from saturated fats, daily to have 5 or more servings of fruits and vegetables.  Education: Controlling Sodium/Reading Food Labels -Group verbal and written material supporting the discussion of sodium use in heart healthy nutrition. Review and explanation with models, verbal and written materials for utilization of the food label.   Education: General Nutrition Guidelines/Fats and Fiber: -Group instruction provided by verbal, written material, models and posters to present the general guidelines for heart healthy nutrition. Gives an explanation and review of dietary fats and fiber.   Biometrics:  Pre Biometrics - 04/24/20 1017      Pre Biometrics   Height 5' 6"  (1.676 m)    Weight 170 lb 12.8 oz (77.5 kg)    BMI (Calculated) 27.58    Single Leg Stand 26.72 seconds            Nutrition Therapy Plan and Nutrition Goals:  Nutrition Therapy & Goals - 07/23/20 1443      Nutrition Therapy   Diet Heart healthy, Low Na    Protein (specify units) 65g    Fiber 25 grams    Whole Grain Foods 3 servings    Saturated Fats 12 max. grams    Fruits and Vegetables 5 servings/day    Sodium 1.5 grams      Personal Nutrition Goals   Nutrition Goal ST: work with RD to try out healthier  food preperations LT: keeping up with her current changes    Comments Appetite is good. prepares her own food. Baked most things, fries some things. Soul food. Discussed heart healthy eating and some techniques to make small healthy changes. Answered pt questions; whole wheat, sweet potato bread, smoked food. Pt reports gaining muscle and walking better.      Intervention Plan   Intervention Prescribe, educate and counsel regarding individualized specific dietary modifications aiming towards targeted core components such as weight, hypertension, lipid management, diabetes, heart failure and other comorbidities.;Nutrition handout(s) given to patient.    Expected Outcomes Short Term Goal: Understand basic principles of dietary content, such as calories, fat, sodium, cholesterol and nutrients.;Short Term Goal: A plan has been developed with personal nutrition goals set during dietitian appointment.;Long Term Goal: Adherence to prescribed  nutrition plan.           Nutrition Assessments:  Nutrition Assessments - 04/24/20 1017      MEDFICTS Scores   Pre Score 78           MEDIFICTS Score Key:          ?70 Need to make dietary changes          40-70 Heart Healthy Diet         ? 40 Therapeutic Level Cholesterol Diet  Nutrition Goals Re-Evaluation:  Nutrition Goals Re-Evaluation    [place] Name 06/09/20 1523 06/23/20 1548 07/21/20 1553         Goals   Nutrition Goal Work on more nutrious snacks and food. Work on more nutrious snacks and food. Work on more nutrious snacks and food.     Comment Hannah Martinez has already started to work on her diet. She is working on Engineer, maintenance. Hannah Martinez continues to work on her diet.  She is making better choices. Continues to work on diet and snacking.     Expected Outcome Short: Continue to work on diet  Long: Continue to follow heart healthy diet. Short: Continue to work on diet  Long: Continue to follow heart healthy diet. Short: Continue to make changes and cut back  on snacking Long; Continue to be heart healthy            Nutrition Goals Discharge (Final Nutrition Goals Re-Evaluation):  Nutrition Goals Re-Evaluation - 07/21/20 1553      Goals   Nutrition Goal Work on more nutrious snacks and food.    Comment Continues to work on diet and snacking.    Expected Outcome Short: Continue to make changes and cut back on snacking Long; Continue to be heart healthy           Psychosocial: Target Goals: Acknowledge presence or absence of significant depression and/or stress, maximize coping skills, provide positive support system. Participant is able to verbalize types and ability to use techniques and skills needed for reducing stress and depression.   Education: Depression - Provides group verbal and written instruction on the correlation between heart/lung disease and depressed mood, treatment options, and the stigmas associated with seeking treatment.   Education: Sleep Hygiene -Provides group verbal and written instruction about how sleep can affect your health.  Define sleep hygiene, discuss sleep cycles and impact of sleep habits. Review good sleep hygiene tips.     Education: Stress and Anxiety: - Provides group verbal and written instruction about the health risks of elevated stress and causes of high stress.  Discuss the correlation between heart/lung disease and anxiety and treatment options. Review healthy ways to manage with stress and anxiety.    Initial Review & Psychosocial Screening:  Initial Psych Review & Screening - 04/23/20 1114      Initial Review   Current issues with None Identified      Family Dynamics   Good Support System? Yes   Friends, First cousin checks on her every day and her church family     Barriers   Psychosocial barriers to participate in program There are no identifiable barriers or psychosocial needs.;The patient should benefit from training in stress management and relaxation.      Screening  Interventions   Interventions Encouraged to exercise;To provide support and resources with identified psychosocial needs;Provide feedback about the scores to participant    Expected Outcomes Short Term goal: Utilizing psychosocial counselor, staff and physician to assist with identification  of specific Stressors or current issues interfering with healing process. Setting desired goal for each stressor or current issue identified.;Long Term Goal: Stressors or current issues are controlled or eliminated.;Short Term goal: Identification and review with participant of any Quality of Life or Depression concerns found by scoring the questionnaire.;Long Term goal: The participant improves quality of Life and PHQ9 Scores as seen by post scores and/or verbalization of changes           Quality of Life Scores:   Quality of Life - 04/24/20 1017      Quality of Life   Select Quality of Life      Quality of Life Scores   Health/Function Pre 29.57 %    Socioeconomic Pre 30 %    Psych/Spiritual Pre 30 %    Family Pre 30 %    GLOBAL Pre 26.82 %          Scores of 19 and below usually indicate a poorer quality of life in these areas.  A difference of  2-3 points is a clinically meaningful difference.  A difference of 2-3 points in the total score of the Quality of Life Index has been associated with significant improvement in overall quality of life, self-image, physical symptoms, and general health in studies assessing change in quality of life.  PHQ-9: Recent Review Flowsheet Data    Depression screen [facility] 2/9 06/23/2020 04/24/2020 04/24/2020   Decreased Interest 1 0 2   Down, Depressed, Hopeless 1 0 1   PHQ - 2 Score 2 0 3   Altered sleeping 0 0 2   Tired, decreased energy 1 1 3    Change in appetite 0 0 3   Feeling bad or failure about yourself  1 0 0   Trouble concentrating 1 0 0   Moving slowly or fidgety/restless 0 0 0   Suicidal thoughts 0 0 0   PHQ-9 Score 5 1 11    Difficult doing  work/chores Somewhat difficult Not difficult at all Somewhat difficult     Interpretation of Total Score  Total Score Depression Severity:  1-4 = Minimal depression, 5-9 = Mild depression, 10-14 = Moderate depression, 15-19 = Moderately severe depression, 20-27 = Severe depression   Psychosocial Evaluation and Intervention:  Psychosocial Evaluation - 04/23/20 1126      Psychosocial Evaluation & Interventions   Comments Hannah Martinez has no recognized barriers to attending the program. She lives alone and wants a dog. She has friends and family as her support system. She is eager to start and have something to do every day. She has had 2 strokes in the past and has left sided weakness. She will use a electric wheelchair at times, does not expect to use it for this program. She should do well during her time here.    Expected Outcomes STG: Hannah Martinez will attend all sessions.  LTG: Hannah Martinez will be able to maintain the lifestyle changes learned while she attended the program.    Continue Psychosocial Services  Follow up required by staff           Psychosocial Re-Evaluation:  Psychosocial Re-Evaluation    [place] Name 06/09/20 1515 06/23/20 1520 07/21/20 1544         Psychosocial Re-Evaluation   Current issues with Current Stress Concerns;Current Sleep Concerns Current Stress Concerns Current Sleep Concerns;Current Stress Concerns     Comments Hannah Martinez is doing well mentally.  She was getting frustrated with transportation, but she thinks she finally has it straight and will  talk to them about her July and August schedule this week.  She continues to cope with her limits from her stroke.  She does not sleep well, and she plans to talk to doctor about sleep.   We talked about watching sleep videos. Hannah Martinez is doing okay mentally.  She is active in her church community and leans on the support of her people and God.  Her PHQ is up from her initial, but overall she is doing okay.  She has had more down days recently.  She sleeps  well.  Overall, her focus is to maintain.  She is trying to keep the negative out of her life. Hannah Martinez is doing well mentally.  She is sleeping/napping more and plans to talk with her doctor about it. She denies any major stressors currently.  She continues to stay positive and keep pushing herself to improve.  There are days she feels like quitting but notices how she is improving.     Expected Outcomes Short: watch sleep videos and talk to doctor about sleep  Long: Continue to stay positive. Short: Continue to focus on the positive  Long: Continue to cope and learn on church community. Short: Continue to focus on positive Long; Continue to work on sleep.     Interventions -- Stress management education;Encouraged to attend Cardiac Rehabilitation for the exercise Stress management education;Encouraged to attend Cardiac Rehabilitation for the exercise     Continue Psychosocial Services  -- Follow up required by staff Follow up required by staff            Psychosocial Discharge (Final Psychosocial Re-Evaluation):  Psychosocial Re-Evaluation - 07/21/20 1544      Psychosocial Re-Evaluation   Current issues with Current Sleep Concerns;Current Stress Concerns    Comments Hannah Martinez is doing well mentally.  She is sleeping/napping more and plans to talk with her doctor about it. She denies any major stressors currently.  She continues to stay positive and keep pushing herself to improve.  There are days she feels like quitting but notices how she is improving.    Expected Outcomes Short: Continue to focus on positive Long; Continue to work on sleep.    Interventions Stress management education;Encouraged to attend Cardiac Rehabilitation for the exercise    Continue Psychosocial Services  Follow up required by staff           Vocational Rehabilitation: Provide vocational rehab assistance to qualifying candidates.   Vocational Rehab Evaluation & Intervention:  Vocational Rehab - 04/23/20 1116      Initial  Vocational Rehab Evaluation & Intervention   Assessment shows need for Vocational Rehabilitation No           Education: Education Goals: Education classes will be provided on a variety of topics geared toward better understanding of heart health and risk factor modification. Participant will state understanding/return demonstration of topics presented as noted by education test scores.  Learning Barriers/Preferences:  Learning Barriers/Preferences - 04/23/20 1116      Learning Barriers/Preferences   Learning Barriers None    Learning Preferences None           General Cardiac Education Topics:  AED/CPR: - Group verbal and written instruction with the use of models to demonstrate the basic use of the AED with the basic ABC's of resuscitation.   Anatomy & Physiology of the Heart: - Group verbal and written instruction and models provide basic cardiac anatomy and physiology, with the coronary electrical and arterial systems. Review of Valvular disease  and Heart Failure   Cardiac Procedures: - Group verbal and written instruction to review commonly prescribed medications for heart disease. Reviews the medication, class of the drug, and side effects. Includes the steps to properly store meds and maintain the prescription regimen. (beta blockers and nitrates)   Cardiac Medications I: - Group verbal and written instruction to review commonly prescribed medications for heart disease. Reviews the medication, class of the drug, and side effects. Includes the steps to properly store meds and maintain the prescription regimen.   Cardiac Rehab from 07/23/2020 in [facility] Cardiac and Pulmonary Rehab  Date 07/23/20  Educator SB  Instruction Review Code 1- Verbalizes Understanding      Cardiac Medications II: -Group verbal and written instruction to review commonly prescribed medications for heart disease. Reviews the medication, class of the drug, and side effects. (all other drug  classes)    Go Sex-Intimacy & Heart Disease, Get SMART - Goal Setting: - Group verbal and written instruction through game format to discuss heart disease and the return to sexual intimacy. Provides group verbal and written material to discuss and apply goal setting through the application of the S.M.A.R.T. Method.   Other Matters of the Heart: - Provides group verbal, written materials and models to describe Stable Angina and Peripheral Artery. Includes description of the disease process and treatment options available to the cardiac patient.   Infection Prevention: - Provides verbal and written material to individual with discussion of infection control including proper hand washing and proper equipment cleaning during exercise session.   Cardiac Rehab from 07/23/2020 in [facility] Cardiac and Pulmonary Rehab  Date 04/24/20  Educator [facility]  Instruction Review Code 1- Verbalizes Understanding      Falls Prevention: - Provides verbal and written material to individual with discussion of falls prevention and safety.   Cardiac Rehab from 07/23/2020 in [facility] Cardiac and Pulmonary Rehab  Date 04/24/20  Educator [facility]  Instruction Review Code 1- Verbalizes Understanding      Other: -Provides group and verbal instruction on various topics (see comments)   Knowledge Questionnaire Score:  Knowledge Questionnaire Score - 04/24/20 1017      Knowledge Questionnaire Score   Pre Score 22/26 Education Focus: MI, NTG, [place], Exercise           Core Components/Risk Factors/Patient Goals at Admission:  Personal Goals and Risk Factors at Admission - 04/24/20 1018      Core Components/Risk Factors/Patient Goals on Admission    Weight Management Yes;Weight Loss    Intervention Weight Management: Develop a combined nutrition and exercise program designed to reach desired caloric intake, while maintaining appropriate intake of nutrient and fiber, sodium and fats, and appropriate energy expenditure  required for the weight goal.;Weight Management: Provide education and appropriate resources to help participant work on and attain dietary goals.    Admit Weight 170 lb 12.8 oz (77.5 kg)    Goal Weight: Short Term 165 lb (74.8 kg)    Goal Weight: Long Term 160 lb (72.6 kg)    Expected Outcomes Short Term: Continue to assess and modify interventions until short term weight is achieved;Long Term: Adherence to nutrition and physical activity/exercise program aimed toward attainment of established weight goal;Weight Loss: Understanding of general recommendations for a balanced deficit meal plan, which promotes 1-2 lb weight loss per week and includes a negative energy balance of [id number] kcal/d;Understanding recommendations for meals to include 15-35% energy as protein, 25-35% energy from fat, 35-60% energy from carbohydrates, less than 267m of dietary  cholesterol, 20-35 gm of total fiber daily;Understanding of distribution of calorie intake throughout the day with the consumption of 4-5 meals/snacks    Heart Failure Yes    Intervention Provide a combined exercise and nutrition program that is supplemented with education, support and counseling about heart failure. Directed toward relieving symptoms such as shortness of breath, decreased exercise tolerance, and extremity edema.    Expected Outcomes Improve functional capacity of life;Short term: Attendance in program 2-3 days a week with increased exercise capacity. Reported lower sodium intake. Reported increased fruit and vegetable intake. Reports medication compliance.;Short term: Daily weights obtained and reported for increase. Utilizing diuretic protocols set by physician.;Long term: Adoption of self-care skills and reduction of barriers for early signs and symptoms recognition and intervention leading to self-care maintenance.    Lipids Yes    Intervention Provide education and support for participant on nutrition & aerobic/resistive exercise along  with prescribed medications to achieve LDL <9m, HDL >414m    Expected Outcomes Short Term: Participant states understanding of desired cholesterol values and is compliant with medications prescribed. Participant is following exercise prescription and nutrition guidelines.;Long Term: Cholesterol controlled with medications as prescribed, with individualized exercise RX and with personalized nutrition plan. Value goals: LDL < 7021mHDL > 40 mg.           Education:Diabetes - Individual verbal and written instruction to review signs/symptoms of diabetes, desired ranges of glucose level fasting, after meals and with exercise. Acknowledge that pre and post exercise glucose checks will be done for 3 sessions at entry of program.   Education: Know Your Numbers and Risk Factors: -Group verbal and written instruction about important numbers in your health.  Discussion of what are risk factors and how they play a role in the disease process.  Review of Cholesterol, Blood Pressure, Diabetes, and BMI and the role they play in your overall health.   Core Components/Risk Factors/Patient Goals Review:   Goals and Risk Factor Review    Row Name 06/09/20 1520 06/23/20 1511 07/21/20 1542         Core Components/Risk Factors/Patient Goals Review   Personal Goals Review Weight Management/Obesity;Heart Failure;Lipids Weight Management/Obesity;Heart Failure;Lipids Weight Management/Obesity;Heart Failure;Lipids     Review Hannah Martinez doing well in rehab.  Her weight is up and down, but she tries not to go over 180 lbs.  She denies heart failure symptoms.  She will occassionally check her pressure at home, but not since coming to class. Overall, she is doing well. Hannah Martinez doing well in rehab.  Her BMI and weight was up some, but her doctor thinks it is fluid build up, so she has started lasix twice a day to help get the weight back down.  She denies any other heart failure symptoms. Reviewed heart failure zone action  plan with her.  Blood pressures have been good, even possibly on low side.  She is doing well with her medications. Hannah Martinez noticed that the scale is creeping up on her.  She has noticed that she ismore tired that normal and wanting to sleep more.  She feels that her pressures have been running a little lower. She has a follow up appointment this week with her cardiologist.  She has not had any heart failure symptoms recently.  Overall, she feels good.     Expected Outcomes Short: Conitnue to work on weight loss Long; Continue to monitor risk factors. Short: Continue to work on weight loss and get fluid off Long; Continue to  monitor risk factors. Short: Continue to work on weight loss Long: Continue to monitor risk factors.            Core Components/Risk Factors/Patient Goals at Discharge (Final Review):   Goals and Risk Factor Review - 07/21/20 1542      Core Components/Risk Factors/Patient Goals Review   Personal Goals Review Weight Management/Obesity;Heart Failure;Lipids    Review Hannah Martinez has noticed that the scale is creeping up on her.  She has noticed that she ismore tired that normal and wanting to sleep more.  She feels that her pressures have been running a little lower. She has a follow up appointment this week with her cardiologist.  She has not had any heart failure symptoms recently.  Overall, she feels good.    Expected Outcomes Short: Continue to work on weight loss Long: Continue to monitor risk factors.           ITP Comments:  ITP Comments    Row Name 04/23/20 1124 04/24/20 1009 04/28/20 1514 05/14/20 0620 06/11/20 0610   ITP Comments Virtual Orientation completed today. HAs EP eval and Gym Orientation scheduled for tomorrow am. Documentation of diagnosis can be found in [facility] 02/24/2020. Completed 6MWT and gym orientation.  Initial ITP created and sent for review to Dr. Emily Filbert, Medical Director. First full day of exercise!  Patient was oriented to gym and equipment including  functions, settings, policies, and procedures.  Patient's individual exercise prescription and treatment plan were reviewed.  All starting workloads were established based on the results of the 6 minute walk test done at initial orientation visit.  The plan for exercise progression was also introduced and progression will be customized based on patient's performance and goals. 30 Day review completed. ITP review done, changes made as directed,and approval shown by signature of  Scientist, research (life sciences). New to program 30 Day review completed. Medical Director ITP review done, changes made as directed, and signed approval by Medical Director.   Start Name 07/09/20 0927 08/06/20 0618         ITP Comments 30 Day review completed. Medical Director ITP review done, changes made as directed, and signed approval by Medical Director. 30 Day review completed. Medical Director ITP review done, changes made as directed, and signed approval by Medical Director.             Comments:

## 2020-08-07 ENCOUNTER — Other Ambulatory Visit: Payer: Self-pay

## 2020-08-07 ENCOUNTER — Encounter: Payer: Medicare Other | Admitting: *Deleted

## 2020-08-07 DIAGNOSIS — I5042 Chronic combined systolic (congestive) and diastolic (congestive) heart failure: Secondary | ICD-10-CM | POA: Diagnosis not present

## 2020-08-07 NOTE — Progress Notes (Signed)
Daily Session Note  Patient Details  Name: Hannah Martinez MRN: [id number] Date of Birth: May 07, 1956 Referring Provider:     Cardiac Rehab from 04/24/2020 in [facility] Cardiac and Pulmonary Rehab  Referring Provider Ida Rogue MD      Encounter Date: 08/07/2020  Check In:  Session Check In - 08/07/20 1539      Check-In   Supervising physician immediately available to respond to emergencies See telemetry face sheet for immediately available ER MD    Location [facility]-Cardiac & Pulmonary Rehab    Staff Present Renita Papa, RN BSN;Joseph [address] [place], [place], [place], CCRP, CCET    Virtual Visit No    Medication changes reported     No    Fall or balance concerns reported    No    Warm-up and Cool-down Performed on first and last piece of equipment    Resistance Training Performed Yes    VAD Patient? No    PAD/SET Patient? No      Pain Assessment   Currently in Pain? No/denies              Social History   Tobacco Use  Smoking Status Never Smoker  Smokeless Tobacco Never Used    Goals Met:  Independence with exercise equipment Exercise tolerated well No report of cardiac concerns or symptoms Strength training completed today  Goals Unmet:  Not Applicable  Comments: Pt able to follow exercise prescription today without complaint.  Will continue to monitor for progression.    Dr. Emily Filbert is Medical Director for [place] and [facility].

## 2020-08-11 ENCOUNTER — Other Ambulatory Visit: Payer: Self-pay

## 2020-08-11 ENCOUNTER — Encounter: Payer: Medicare Other | Admitting: *Deleted

## 2020-08-11 DIAGNOSIS — I5042 Chronic combined systolic (congestive) and diastolic (congestive) heart failure: Secondary | ICD-10-CM | POA: Diagnosis not present

## 2020-08-11 NOTE — Progress Notes (Signed)
Daily Session Note  Patient Details  Name: Geanine Vandekamp MRN: [id number] Date of Birth: July 15, 1956 Referring Provider:     Cardiac Rehab from 04/24/2020 in [facility] Cardiac and Pulmonary Rehab  Referring Provider Ida Rogue MD      Encounter Date: 08/11/2020  Check In:  Session Check In - 08/11/20 1540      Check-In   Supervising physician immediately available to respond to emergencies See telemetry face sheet for immediately available ER MD    Location [facility]-Cardiac & Pulmonary Rehab    Staff Present Heath Lark, RN, BSN, Laveda Norman, BS, ACSM CEP, Exercise Physiologist;Kara Eliezer Bottom, MS Exercise Physiologist    Virtual Visit No    Medication changes reported     Delene Loll started   Fall or balance concerns reported    No    Warm-up and Cool-down Performed on first and last piece of equipment    Resistance Training Performed Yes    VAD Patient? No    PAD/SET Patient? No      Pain Assessment   Currently in Pain? No/denies              Social History   Tobacco Use  Smoking Status Never Smoker  Smokeless Tobacco Never Used    Goals Met:  Independence with exercise equipment Exercise tolerated well No report of cardiac concerns or symptoms  Goals Unmet:  Not Applicable  Comments: Pt able to follow exercise prescription today without complaint.  Will continue to monitor for progression.    Dr. Emily Filbert is Medical Director for [place] and [facility].

## 2020-08-13 ENCOUNTER — Other Ambulatory Visit: Payer: Self-pay

## 2020-08-13 ENCOUNTER — Encounter: Payer: Medicare Other | Admitting: *Deleted

## 2020-08-13 DIAGNOSIS — I5042 Chronic combined systolic (congestive) and diastolic (congestive) heart failure: Secondary | ICD-10-CM

## 2020-08-13 NOTE — Progress Notes (Signed)
Daily Session Note  Patient Details  Name: Hannah Martinez MRN: [id number] Date of Birth: August 06, 1956 Referring Provider:     Cardiac Rehab from 04/24/2020 in [facility] Cardiac and Pulmonary Rehab  Referring Provider Ida Rogue MD      Encounter Date: 08/13/2020  Check In:  Session Check In - 08/13/20 1739      Check-In   Supervising physician immediately available to respond to emergencies See telemetry face sheet for immediately available ER MD    Location [facility]-Cardiac & Pulmonary Rehab    Staff Present Nyoka Cowden, RN, BSN, MA;Joseph [place], [place] Exercise Physiologist    Virtual Visit No    Medication changes reported     No    Fall or balance concerns reported    No    Warm-up and Cool-down Performed on first and last piece of equipment    Resistance Training Performed Yes    VAD Patient? No    PAD/SET Patient? No      Pain Assessment   Currently in Pain? No/denies              Social History   Tobacco Use  Smoking Status Never Smoker  Smokeless Tobacco Never Used    Goals Met:  Independence with exercise equipment Exercise tolerated well No report of cardiac concerns or symptoms Strength training completed today  Goals Unmet:  Not Applicable  Comments: Pt able to follow exercise prescription today without complaint.  Will continue to monitor for progression.   Dr. Emily Filbert is Medical Director for [place] and [facility].

## 2020-08-14 ENCOUNTER — Other Ambulatory Visit: Payer: Self-pay

## 2020-08-14 ENCOUNTER — Encounter: Payer: Medicare Other | Admitting: *Deleted

## 2020-08-14 VITALS — Ht 66.0 in | Wt 182.8 lb

## 2020-08-14 DIAGNOSIS — I5042 Chronic combined systolic (congestive) and diastolic (congestive) heart failure: Secondary | ICD-10-CM

## 2020-08-14 NOTE — Progress Notes (Signed)
Daily Session Note  Patient Details  Name: Hannah Martinez MRN: [id number] Date of Birth: 08/08/1956 Referring Provider:     Cardiac Rehab from 04/24/2020 in [facility] Cardiac and Pulmonary Rehab  Referring Provider Ida Rogue MD      Encounter Date: 08/14/2020  Check In:  Session Check In - 08/14/20 1649      Check-In   Supervising physician immediately available to respond to emergencies See telemetry face sheet for immediately available ER MD    Location [facility]-Cardiac & Pulmonary Rehab    Staff Present Nyoka Cowden, RN, BSN, MA;Joseph [place] RCP,RRT,BSRT;Kelly Amedeo Plenty, BS, ACSM CEP, Exercise Physiologist;Jessica [place], [place], RCEP, CCRP, CCET    Virtual Visit No    Medication changes reported     No    Fall or balance concerns reported    No    Warm-up and Cool-down Performed on first and last piece of equipment    VAD Patient? No    PAD/SET Patient? No              Social History   Tobacco Use  Smoking Status Never Smoker  Smokeless Tobacco Never Used    Goals Met:  Independence with exercise equipment Exercise tolerated well No report of cardiac concerns or symptoms Strength training completed today  Goals Unmet:  Not Applicable  Comments: Pt able to follow exercise prescription today without complaint.  Will continue to monitor for progression.   Dr. Emily Filbert is Medical Director for [place] and [facility].

## 2020-08-18 ENCOUNTER — Encounter: Payer: Medicare Other | Admitting: *Deleted

## 2020-08-18 ENCOUNTER — Other Ambulatory Visit: Payer: Self-pay

## 2020-08-18 DIAGNOSIS — I5042 Chronic combined systolic (congestive) and diastolic (congestive) heart failure: Secondary | ICD-10-CM

## 2020-08-18 NOTE — Patient Instructions (Signed)
Discharge Patient Instructions  Patient Details  Name: Hannah Martinez MRN: [id number] Date of Birth: 1956-10-11 Referring Provider:  Minna Merritts, MD   Number of Visits: 40  Reason for Discharge:  Patient reached a stable level of exercise. Patient independent in their exercise. Patient has met program and personal goals.  Smoking History:  Social History   Tobacco Use  Smoking Status Never Smoker  Smokeless Tobacco Never Used    Diagnosis:  No diagnosis found.  Initial Exercise Prescription:  Initial Exercise Prescription - 04/24/20 1000      Date of Initial Exercise RX and Referring Provider   Date 04/24/20    Referring Provider Ida Rogue MD      Treadmill   MPH 0.7    Grade 0    Minutes 15    METs 1.5      NuStep   Level 1    SPM 80    Minutes 15    METs 1      Biostep-RELP   Level 1    SPM 50    Minutes 15    METs 1      Prescription Details   Frequency (times per week) 3    Duration Progress to 30 minutes of continuous aerobic without signs/symptoms of physical distress      Intensity   THRR 40-80% of Max Heartrate 112-143    Ratings of Perceived Exertion 11-13    Perceived Dyspnea 0-4      Progression   Progression Continue to progress workloads to maintain intensity without signs/symptoms of physical distress.      Resistance Training   Training Prescription Yes    Weight 3 lb    Reps 10-15           Discharge Exercise Prescription (Final Exercise Prescription Changes):  Exercise Prescription Changes - 08/12/20 1500      Response to Exercise   Blood Pressure (Admit) 130/80    Blood Pressure (Exercise) 120/74    Blood Pressure (Exit) 118/74    Heart Rate (Admit) 75 bpm    Heart Rate (Exercise) 100 bpm    Heart Rate (Exit) 76 bpm    Rating of Perceived Exertion (Exercise) 12    Symptoms none    Duration Continue with 30 min of aerobic exercise without signs/symptoms of physical distress.    Intensity THRR unchanged        Progression   Progression Continue to progress workloads to maintain intensity without signs/symptoms of physical distress.    Average METs 2      Resistance Training   Training Prescription Yes    Weight 3 lb    Reps 10-15      Interval Training   Interval Training No      NuStep   Level 5    Minutes 15    METs 2.2      REL-XR   Level 3    Minutes 15    METs 2.2      Home Exercise Plan   Plans to continue exercise at Home (comment)   walking   Frequency Add 2 additional days to program exercise sessions.    Initial Home Exercises Provided 06/09/20           Functional Capacity:  6 Minute Walk    Row Name 04/24/20 1009 08/14/20 1503       6 Minute Walk   Phase Initial Discharge    Distance 574 feet 450 feet    Distance %  Change -- -124 %    Distance Feet Change -- -21.6 ft    Walk Time 6 minutes 6 minutes    # of Rest Breaks 0 0    MPH 1.08 0.85    METS 2.25 1.57    RPE 9 13    VO2 Peak 7.86 5.5    Symptoms No Yes (comment)    Comments -- fatigue--has found that she is relying on her cane more and more    Resting HR 82 bpm 53 bpm    Resting BP 146/70 130/74    Resting Oxygen Saturation  99 % --    Exercise Oxygen Saturation  during 6 min walk 99 % --    Max Ex. HR 108 bpm 88 bpm    Max Ex. BP 146/74 134/74    2 Minute Post BP 132/60 --           Nutrition & Weight - Outcomes:  Pre Biometrics - 04/24/20 1017      Pre Biometrics   Height _0  (1.676 m)    Weight 170 lb 12.8 oz (77.5 kg)    BMI (Calculated) 27.58    Single Leg Stand 26.72 seconds           Post Biometrics - 08/14/20 1504       Post  Biometrics   Height _1  (1.676 m)    Weight 182 lb 12.8 oz (82.9 kg)    BMI (Calculated) 29.52    Single Leg Stand 5.27 seconds           Nutrition:  Nutrition Therapy & Goals - 07/23/20 1443      Nutrition Therapy   Diet Heart healthy, Low Na    Protein (specify units) 65g    Fiber 25 grams    Whole Grain Foods 3 servings     Saturated Fats 12 max. grams    Fruits and Vegetables 5 servings/day    Sodium 1.5 grams      Personal Nutrition Goals   Nutrition Goal ST: work with RD to try out healthier food preperations LT: keeping up with her current changes    Comments Appetite is good. prepares her own food. Baked most things, fries some things. Soul food. Discussed heart healthy eating and some techniques to make small healthy changes. Answered pt questions; whole wheat, sweet potato bread, smoked food. Pt reports gaining muscle and walking better.      Intervention Plan   Intervention Prescribe, educate and counsel regarding individualized specific dietary modifications aiming towards targeted core components such as weight, hypertension, lipid management, diabetes, heart failure and other comorbidities.;Nutrition handout(s) given to patient.    Expected Outcomes Short Term Goal: Understand basic principles of dietary content, such as calories, fat, sodium, cholesterol and nutrients.;Short Term Goal: A plan has been developed with personal nutrition goals set during dietitian appointment.;Long Term Goal: Adherence to prescribed nutrition plan.            Goals reviewed with patient; copy given to patient.

## 2020-08-18 NOTE — Progress Notes (Signed)
Daily Session Note  Patient Details  Name: Hannah Martinez MRN: [id number] Date of Birth: 1956/09/08 Referring Provider:     Cardiac Rehab from 04/24/2020 in [facility] Cardiac and Pulmonary Rehab  Referring Provider Ida Rogue MD      Encounter Date: 08/18/2020  Check In:  Session Check In - 08/18/20 1623      Check-In   Supervising physician immediately available to respond to emergencies See telemetry face sheet for immediately available ER MD    Location [facility]-Cardiac & Pulmonary Rehab    Staff Present Heath Lark, RN, BSN, CCRP;Jessica Ferry Pass, MA, RCEP, CCRP, [place], BA, ACSM CEP, Exercise Physiologist;Kelly Amedeo Plenty, BS, ACSM CEP, Exercise Physiologist    Virtual Visit No    Medication changes reported     No    Fall or balance concerns reported    No    Warm-up and Cool-down Performed on first and last piece of equipment    Resistance Training Performed Yes    VAD Patient? No    PAD/SET Patient? No      Pain Assessment   Currently in Pain? No/denies              Social History   Tobacco Use  Smoking Status Never Smoker  Smokeless Tobacco Never Used    Goals Met:  Independence with exercise equipment Exercise tolerated well No report of cardiac concerns or symptoms  Goals Unmet:  Not Applicable  Comments: Pt able to follow exercise prescription today without complaint.  Will continue to monitor for progression.    Dr. Emily Filbert is Medical Director for [place] and [facility].

## 2020-08-20 ENCOUNTER — Encounter: Payer: Medicare Other | Admitting: *Deleted

## 2020-08-20 ENCOUNTER — Other Ambulatory Visit: Payer: Self-pay

## 2020-08-20 DIAGNOSIS — I5042 Chronic combined systolic (congestive) and diastolic (congestive) heart failure: Secondary | ICD-10-CM | POA: Diagnosis not present

## 2020-08-20 NOTE — Progress Notes (Signed)
Daily Session Note  Patient Details  Name: Hannah Martinez MRN: [id number] Date of Birth: [birth year] Referring Provider:     Cardiac Rehab from 04/24/2020 in [facility] Cardiac and Pulmonary Rehab  Referring Provider Ida Rogue MD      Encounter Date: 08/20/2020  Check In:  Session Check In - 08/20/20 1600      Check-In   Supervising physician immediately available to respond to emergencies See telemetry face sheet for immediately available ER MD    Location [facility]-Cardiac & Pulmonary Rehab    Staff Present Renita Papa, RN Margurite Auerbach, MS Exercise Physiologist;Melissa Caiola RDN, Rowe Pavy, BA, ACSM CEP, Exercise Physiologist    Virtual Visit No    Medication changes reported     No    Fall or balance concerns reported    No    Warm-up and Cool-down Performed on first and last piece of equipment    Resistance Training Performed Yes    VAD Patient? No    PAD/SET Patient? No      Pain Assessment   Currently in Pain? No/denies              Social History   Tobacco Use  Smoking Status Never Smoker  Smokeless Tobacco Never Used    Goals Met:  Independence with exercise equipment Exercise tolerated well No report of cardiac concerns or symptoms Strength training completed today  Goals Unmet:  Not Applicable  Comments:  [facility] graduated today from  rehab with 34 sessions completed.  Details of the patient's exercise prescription and what She needs to do in order to continue the prescription and progress were discussed with patient.  Patient was given a copy of prescription and goals.  Patient verbalized understanding.  [name] plans to continue to exercise by walking.    Dr. Emily Filbert is Medical Director for [place] [facility].

## 2020-08-20 NOTE — Progress Notes (Signed)
Cardiac Individual Treatment Plan  Patient Details  Name: Hannah Martinez MRN: [id number] Date of Birth: 27-May-1956 Referring Provider:     Cardiac Rehab from 04/24/2020 in [facility] Cardiac and Pulmonary Rehab  Referring Provider Ida Rogue MD      Initial Encounter Date:    Cardiac Rehab from 04/24/2020 in [facility] Cardiac and Pulmonary Rehab  Date 04/24/20      Visit Diagnosis: Heart failure, systolic and diastolic, chronic ([place])  Patient's Home Medications on Admission:  Current Outpatient Medications:    benzonatate (TESSALON PERLES) 100 MG capsule, Take 1 capsule (100 mg total) by mouth every 6 (six) hours as needed for cough., Disp: 30 capsule, Rfl: 0   [employer] ASPIRIN ADULT LOW DOSE 81 MG chewable tablet, CHEW 1 TABLET (81 MG TOTAL) BY MOUTH DAILY., Disp: 90 tablet, Rfl: 0   fluticasone (FLONASE) 50 MCG/ACT nasal spray, Place 1-2 sprays into both nostrils daily as needed for allergies., Disp: , Rfl:    furosemide (LASIX) 40 MG tablet, Take 1 tablet (40 mg total) by mouth daily., Disp: 90 tablet, Rfl: 3   gabapentin (NEURONTIN) 300 MG capsule, Take 300 mg by mouth 3 (three) times daily. (take each capsule with 682m tablet to equal 9057mthree times a day), Disp: , Rfl:    gabapentin (NEURONTIN) 600 MG tablet, Take 600 mg by mouth 3 (three) times daily. (take each tablet with 30045mapsule to equal 900m53mree times a day), Disp: , Rfl:    HYDROcodone-acetaminophen (NORCO/VICODIN) 5-325 MG tablet, Take 1 tablet by mouth every 6 (six) hours as needed for moderate pain., Disp: 10 tablet, Rfl: 0   meloxicam (MOBIC) 7.5 MG tablet, Take 7.5 mg by mouth daily., Disp: , Rfl:    metoprolol succinate (TOPROL-XL) 25 MG 24 hr tablet, Take 1 tablet (25 mg total) by mouth daily., Disp: 45 tablet, Rfl: 1   omeprazole (PRILOSEC) 20 MG capsule, Take 20 mg by mouth daily., Disp: , Rfl:    sacubitril-valsartan (ENTRESTO) 24-26 MG, Take 1 tablet by mouth 2 (two) times daily., Disp: 180 tablet,  Rfl: 3   simvastatin (ZOCOR) 40 MG tablet, TAKE 1 TABLET BY MOUTH EVERYDAY AT BEDTIME, Disp: 90 tablet, Rfl: 3   spironolactone (ALDACTONE) 25 MG tablet, Take 0.5 tablets (12.5 mg total) by mouth daily., Disp: 45 tablet, Rfl: 1  Past Medical History: Past Medical History:  Diagnosis Date   Cerebral aneurysm    Chronic pain    Degenerative joint disease    Hypercholesteremia    Stroke (HCC)[place] TMJ (dislocation of temporomandibular joint)     Tobacco Use: Social History   Tobacco Use  Smoking Status Never Smoker  Smokeless Tobacco Never Used    Labs: Recent Review [employer], physiologicalLabs for [facility] Cardiac and Pulmonary Rehab Latest Ref Rng & Units 02/25/2020 06/17/2020   Cholestrol 100 - 199 mg/dL 181 178   LDLCALC 0 - 99 mg/dL 123(H) 102(H)   HDL >39 mg/dL 45 54   Trlycerides 0 - 149 mg/dL 64 122   Hemoglobin A1c 4.8 - 5.6 % 4.9 -       Exercise Target Goals: Exercise Program Goal: Individual exercise prescription set using results from initial 6 min walk test and THRR while considering  patients activity barriers and safety.   Exercise Prescription Goal: Initial exercise prescription builds to 30-45 minutes a day of aerobic activity, 2-3 days per week.  Home exercise guidelines will be given to patient during program as part of  exercise prescription that the participant will acknowledge.   Education: Aerobic Exercise & Resistance Training: - Gives group verbal and written instruction on the various components of exercise. Focuses on aerobic and resistive training programs and the benefits of this training and how to safely progress through these programs..   Cardiac Rehab from 08/20/2020 in [facility] Cardiac and Pulmonary Rehab  Date 08/13/20  Educator AS  Instruction Review Code 1- Verbalizes Understanding      Education: Exercise & Equipment Safety: - Individual verbal instruction and demonstration of equipment use and safety with use of the equipment.   Cardiac  Rehab from 08/20/2020 in [facility] Cardiac and Pulmonary Rehab  Date 04/24/20  Educator [facility]  Instruction Review Code 1- Verbalizes Understanding      Education: Exercise Physiology & General Exercise Guidelines: - Group verbal and written instruction with models to review the exercise physiology of the cardiovascular system and associated critical values. Provides general exercise guidelines with specific guidelines to those with heart or lung disease.    Cardiac Rehab from 08/20/2020 in [facility] Cardiac and Pulmonary Rehab  Date 07/09/20  Educator AS  Instruction Review Code 1- Verbalizes Understanding      Education: Flexibility, Balance, Mind/Body Relaxation: Provides group verbal/written instruction on the benefits of flexibility and balance training, including mind/body exercise modes such as yoga, pilates and tai chi.  Demonstration and skill practice provided.   Activity Barriers & Risk Stratification:  Activity Barriers & Cardiac Risk Stratification - 04/24/20 1011      Activity Barriers & Cardiac Risk Stratification   Activity Barriers Arthritis;Other (comment);Deconditioning;Muscular Weakness;Assistive Device    Comments Left sided weakness post CVA 1999  2003 (very limited left arm use)    Cardiac Risk Stratification High           6 Minute Walk:  6 Minute Walk    Row Name 04/24/20 1009 08/14/20 1503       6 Minute Walk   Phase Initial Discharge    Distance 574 feet 450 feet    Distance % Change -- -124 %    Distance Feet Change -- -21.6 ft    Walk Time 6 minutes 6 minutes    # of Rest Breaks 0 0    MPH 1.08 0.85    METS 2.25 1.57    RPE 9 13    VO2 Peak 7.86 5.5    Symptoms No Yes (comment)    Comments -- fatigue--has found that she is relying on her cane more and more    Resting HR 82 bpm 53 bpm    Resting BP 146/70 130/74    Resting Oxygen Saturation  99 % --    Exercise Oxygen Saturation  during 6 min walk 99 % --    Max Ex. HR 108 bpm 88 bpm    Max Ex. BP  146/74 134/74    2 Minute Post BP 132/60 --           Oxygen Initial Assessment:   Oxygen Re-Evaluation:   Oxygen Discharge (Final Oxygen Re-Evaluation):   Initial Exercise Prescription:  Initial Exercise Prescription - 04/24/20 1000      Date of Initial Exercise RX and Referring Provider   Date 04/24/20    Referring Provider Ida Rogue MD      Treadmill   MPH 0.7    Grade 0    Minutes 15    METs 1.5      NuStep   Level 1    SPM 80  Minutes 15    METs 1      Biostep-RELP   Level 1    SPM 50    Minutes 15    METs 1      Prescription Details   Frequency (times per week) 3    Duration Progress to 30 minutes of continuous aerobic without signs/symptoms of physical distress      Intensity   THRR 40-80% of Max Heartrate 112-143    Ratings of Perceived Exertion 11-13    Perceived Dyspnea 0-4      Progression   Progression Continue to progress workloads to maintain intensity without signs/symptoms of physical distress.      Resistance Training   Training Prescription Yes    Weight 3 lb    Reps 10-15           Perform Capillary Blood Glucose checks as needed.  Exercise Prescription Changes:  Exercise Prescription Changes    Row Name 04/24/20 1000 05/06/20 1500 05/22/20 1500 06/05/20 1400 06/16/20 1500     Response to Exercise   Blood Pressure (Admit) 146/70 124/70 98/5 90/58 102/60   Blood Pressure (Exercise) 146/74 140/72 124/70 110/58 118/68   Blood Pressure (Exit) 132/60 100/60 106/58 88/56 112/70   Heart Rate (Admit) 82 bpm 66 bpm 76 bpm 75 bpm 90 bpm   Heart Rate (Exercise) 108 bpm 100 bpm 102 bpm 93 bpm 91 bpm   Heart Rate (Exit) 85 bpm 107 bpm 85 bpm 77 bpm 88 bpm   Oxygen Saturation (Admit) 99 % -- -- -- --   Oxygen Saturation (Exercise) 99 % -- -- -- --   Rating of Perceived Exertion (Exercise) 9 11 13 11 13    Symptoms limps from strokes -- none none none   Comments walk test results -- -- -- --   Duration -- -- Continue with 30  min of aerobic exercise without signs/symptoms of physical distress. Continue with 30 min of aerobic exercise without signs/symptoms of physical distress. Continue with 30 min of aerobic exercise without signs/symptoms of physical distress.   Intensity -- -- THRR unchanged THRR unchanged THRR unchanged     Progression   Progression -- -- Continue to progress workloads to maintain intensity without signs/symptoms of physical distress. Continue to progress workloads to maintain intensity without signs/symptoms of physical distress. Continue to progress workloads to maintain intensity without signs/symptoms of physical distress.   Average METs -- -- 2.03 2.5 2.65     Resistance Training   Training Prescription -- -- Yes Yes Yes   Weight -- -- 3 lb 3 lb 3 lb   Reps -- -- 10-15 10-15 10-15     Interval Training   Interval Training -- -- No No No     Treadmill   MPH -- -- 0.7 -- --   Grade -- -- 0 -- --   Minutes -- -- 3 -- --   METs -- -- 1.5 -- --     NuStep   Level -- 2 3 3 4    SPM -- 80 -- -- --   Minutes -- 15 15 15 15    METs -- 2.1 2.6 2 2.9     Biostep-RELP   Level -- 1 3 3  --   SPM -- 50 -- -- --   Minutes -- 15 15 15  --   METs -- 2 2 3  --     Home Exercise Plan   Plans to continue exercise at -- -- -- -- Home (comment)  walking  Frequency -- -- -- -- Add 2 additional days to program exercise sessions.   Initial Home Exercises Provided -- -- -- -- 06/09/20   Row Name 07/02/20 1500 07/14/20 1400 07/29/20 1300 08/12/20 1500       Response to Exercise   Blood Pressure (Admit) 102/66 104/78 98/54 130/80    Blood Pressure (Exercise) 144/66 130/70 106/60 120/74    Blood Pressure (Exit) 90/54 100/60 98/60 118/74    Heart Rate (Admit) 87 bpm 74 bpm 75 bpm 75 bpm    Heart Rate (Exercise) 106 bpm 99 bpm 92 bpm 100 bpm    Heart Rate (Exit) 82 bpm 83 bpm 68 bpm 76 bpm    Rating of Perceived Exertion (Exercise) 12 13 12 12     Symptoms none none none none    Duration Continue  with 30 min of aerobic exercise without signs/symptoms of physical distress. Continue with 30 min of aerobic exercise without signs/symptoms of physical distress. Continue with 30 min of aerobic exercise without signs/symptoms of physical distress. Continue with 30 min of aerobic exercise without signs/symptoms of physical distress.    Intensity THRR unchanged THRR unchanged THRR unchanged THRR unchanged      Progression   Progression Continue to progress workloads to maintain intensity without signs/symptoms of physical distress. Continue to progress workloads to maintain intensity without signs/symptoms of physical distress. Continue to progress workloads to maintain intensity without signs/symptoms of physical distress. Continue to progress workloads to maintain intensity without signs/symptoms of physical distress.    Average METs 3.3 2.5 2.7 2      Resistance Training   Training Prescription Yes Yes Yes Yes    Weight 3 lb 3 lb 3 lb 3 lb    Reps 10-15 10-15 10-15 10-15      Interval Training   Interval Training No No No No      NuStep   Level 2 3 3 5     SPM 80 -- 80 --    Minutes 15 30 15 15     METs 3.3 2.6 2.1 2.2      REL-XR   Level -- -- -- 3    Minutes -- -- -- 15    METs -- -- -- 2.2      Home Exercise Plan   Plans to continue exercise at Home (comment)  walking Home (comment)  walking Home (comment)  walking Home (comment)  walking    Frequency Add 2 additional days to program exercise sessions. Add 2 additional days to program exercise sessions. Add 2 additional days to program exercise sessions. Add 2 additional days to program exercise sessions.    Initial Home Exercises Provided 06/09/20 06/09/20 06/09/20 06/09/20           Exercise Comments:   Exercise Goals and Review:  Exercise Goals    Row Name 04/24/20 1016             Exercise Goals   Increase Physical Activity Yes       Intervention Provide advice, education, support and counseling about physical  activity/exercise needs.;Develop an individualized exercise prescription for aerobic and resistive training based on initial evaluation findings, risk stratification, comorbidities and participant's personal goals.       Expected Outcomes Short Term: Attend rehab on a regular basis to increase amount of physical activity.;Long Term: Add in home exercise to make exercise part of routine and to increase amount of physical activity.;Long Term: Exercising regularly at least 3-5 days a week.  Increase Strength and Stamina Yes       Intervention Provide advice, education, support and counseling about physical activity/exercise needs.;Develop an individualized exercise prescription for aerobic and resistive training based on initial evaluation findings, risk stratification, comorbidities and participant's personal goals.       Expected Outcomes Short Term: Increase workloads from initial exercise prescription for resistance, speed, and METs.;Short Term: Perform resistance training exercises routinely during rehab and add in resistance training at home;Long Term: Improve cardiorespiratory fitness, muscular endurance and strength as measured by increased METs and functional capacity (6MWT)       Able to understand and use rate of perceived exertion (RPE) scale Yes       Intervention Provide education and explanation on how to use RPE scale       Expected Outcomes Short Term: Able to use RPE daily in rehab to express subjective intensity level;Long Term:  Able to use RPE to guide intensity level when exercising independently       Able to understand and use Dyspnea scale Yes       Intervention Provide education and explanation on how to use Dyspnea scale       Expected Outcomes Short Term: Able to use Dyspnea scale daily in rehab to express subjective sense of shortness of breath during exertion;Long Term: Able to use Dyspnea scale to guide intensity level when exercising independently       Knowledge and  understanding of Target Heart Rate Range (THRR) Yes       Intervention Provide education and explanation of THRR including how the numbers were predicted and where they are located for reference       Expected Outcomes Short Term: Able to state/look up THRR;Short Term: Able to use daily as guideline for intensity in rehab;Long Term: Able to use THRR to govern intensity when exercising independently       Able to check pulse independently Yes       Intervention Provide education and demonstration on how to check pulse in carotid and radial arteries.;Review the importance of being able to check your own pulse for safety during independent exercise       Expected Outcomes Short Term: Able to explain why pulse checking is important during independent exercise;Long Term: Able to check pulse independently and accurately       Understanding of Exercise Prescription Yes       Intervention Provide education, explanation, and written materials on patient's individual exercise prescription       Expected Outcomes Short Term: Able to explain program exercise prescription;Long Term: Able to explain home exercise prescription to exercise independently              Exercise Goals Re-Evaluation :  Exercise Goals Re-Evaluation    Row Name 04/28/20 1515 05/06/20 1514 05/22/20 1515 06/05/20 1411 06/09/20 1512     Exercise Goal Re-Evaluation   Exercise Goals Review Increase Physical Activity;Able to understand and use rate of perceived exertion (RPE) scale;Knowledge and understanding of Target Heart Rate Range (THRR);Understanding of Exercise Prescription;Increase Strength and Stamina;Able to check pulse independently Increase Physical Activity;Able to understand and use rate of perceived exertion (RPE) scale;Knowledge and understanding of Target Heart Rate Range (THRR);Understanding of Exercise Prescription;Increase Strength and Stamina;Able to check pulse independently Increase Physical Activity;Increase Strength  and Stamina;Understanding of Exercise Prescription Increase Physical Activity;Increase Strength and Stamina;Understanding of Exercise Prescription Increase Physical Activity;Increase Strength and Stamina;Understanding of Exercise Prescription   Comments Reviewed RPE and dyspnea scales, THR and program prescription  with pt today.  Pt voiced understanding and was given a copy of goals to take home. Hannah Martinez has tolerated exercise well.  She was able to walk on TM for a few minutes.  Staff will monito progress. Hannah Martinez is doing well in rehab.  She is still fearful of the treadmill, and has been averaging around 3 min on there.  She is up to level 3 on BioStep and NuStep.  We will continue to monitor her progress. Hannah Martinez has been doing well in rehab. She has not been on the treadmill recently.  We will encourage moving up workloads.  We will continue to montior her progress. Hannah Martinez is doing well in rehab.  She is walking in her parking lot  at home for about 20 min.  We talked about increasing to 30 min. Reviewed home exercise with pt today.  Pt plans to walk longer and staff videos for exercise.  Reviewed THR, pulse, RPE, sign and symptoms, pulse oximetery and when to call 911 or MD.  Also discussed weather considerations and indoor options.  Pt voiced understanding.   Expected Outcomes Short: Use RPE daily to regulate intensity. Long: Follow program prescription in THR. Short: attend program session sconsistently Long:  increase stamina Short: return to regular attendance  Long: Continue to improve stamina. Short: Increase workloads Long: Continue to improve stamina. Short: Increase walking time at home Long: Continue to improve stamina.   [place] Name 06/16/20 1534 06/23/20 1507 07/02/20 1558 07/14/20 1448 07/21/20 1540     Exercise Goal Re-Evaluation   Exercise Goals Review Increase Physical Activity;Increase Strength and Stamina;Understanding of Exercise Prescription Increase Physical Activity;Increase Strength and  Stamina;Understanding of Exercise Prescription Increase Physical Activity;Increase Strength and Stamina;Understanding of Exercise Prescription Increase Physical Activity;Increase Strength and Stamina;Understanding of Exercise Prescription Increase Physical Activity;Increase Strength and Stamina;Understanding of Exercise Prescription   Comments Hannah Martinez continues to do well in rehab. She is still avoiding the treadmill as she builds up her nerves to try again.  She is up to level 4 on the NuStep and we continue to strap her foot.  We will continue to monitor her progress. Hannah Martinez is doing well in rehab.  She is walking on her off days at home in the parking lot for about 20-31mn. She is also doing her weights on her good.  Her strength and stamina are improving.  She is now able to go shopping again. KWendelyn Breslowhas had success using the NS with the leg stabilizer attachment.  She has been able to increase MET levels.  Staff willmonitor progress. KWendelyn Breslowis doing well in rehab.  She is now up to level 3 on the NuStep and able to maintain pressure on her bad leg.  We will continue to monitor her progress. KWendelyn Breslowis doing well in rehab.  She is feeling better overall.  She is walking on her off days and getting in 5 laps now at home!  She is also able to tell the difference in her strength and stamina and her good leg is getting stronger.   Expected Outcomes Short: Continue to build up courage for treadmill  Long: Continue to improve stamina. Short: Continue to walk for at least 30 min on off days  Long: Continue improve stamina and strength. Short: continue current plan Long: improve stamina Short: Continue to increase workloads Long; Continue to build stamina Short: Continue to attend regularly Long: Continue to exercise to build strength.   [place] 07[place]20 1325 08/11/20 1559  Exercise Goal Re-Evaluation   Exercise Goals Review Increase Physical Activity;Increase Strength and Stamina;Understanding of Exercise  Prescription Increase Physical Activity;Increase Strength and Stamina;Understanding of Exercise Prescription      Comments Hannah Martinez continues to do well with exercise.  She can do the Nustep and XR without trouble.  We will continue to monitor pogress.  She has missed a couple days this week due to transportation. Hannah Martinez is doing well in rehab.  She will be doing her walk test later this week.  We expect to see an improvement.  She is already planning to go to [employer] with a friend after graduation.  She will also continue to walk in her parking lot to improve her stamina.      Expected Outcomes Short: work on increasing levels on machines Long: increase stamina Short; Improve post 6MWT Long: Continue to exercise independently             Discharge Exercise Prescription (Final Exercise Prescription Changes):  Exercise Prescription Changes - 08/12/20 1500      Response to Exercise   Blood Pressure (Admit) 130/80    Blood Pressure (Exercise) 120/74    Blood Pressure (Exit) 118/74    Heart Rate (Admit) 75 bpm    Heart Rate (Exercise) 100 bpm    Heart Rate (Exit) 76 bpm    Rating of Perceived Exertion (Exercise) 12    Symptoms none    Duration Continue with 30 min of aerobic exercise without signs/symptoms of physical distress.    Intensity THRR unchanged      Progression   Progression Continue to progress workloads to maintain intensity without signs/symptoms of physical distress.    Average METs 2      Resistance Training   Training Prescription Yes    Weight 3 lb    Reps 10-15      Interval Training   Interval Training No      NuStep   Level 5    Minutes 15    METs 2.2      REL-XR   Level 3    Minutes 15    METs 2.2      Home Exercise Plan   Plans to continue exercise at Home (comment)   walking   Frequency Add 2 additional days to program exercise sessions.    Initial Home Exercises Provided 06/09/20           Nutrition:  Target Goals: Understanding of nutrition  guidelines, daily intake of sodium <1542m, cholesterol <205m calories 30% from fat and 7% or less from saturated fats, daily to have 5 or more servings of fruits and vegetables.  Education: Controlling Sodium/Reading Food Labels -Group verbal and written material supporting the discussion of sodium use in heart healthy nutrition. Review and explanation with models, verbal and written materials for utilization of the food label.   Education: General Nutrition Guidelines/Fats and Fiber: -Group instruction provided by verbal, written material, models and posters to present the general guidelines for heart healthy nutrition. Gives an explanation and review of dietary fats and fiber.   Biometrics:  Pre Biometrics - 04/24/20 1017      Pre Biometrics   Height 5' 6"  (1.676 m)    Weight 170 lb 12.8 oz (77.5 kg)    BMI (Calculated) 27.58    Single Leg Stand 26.72 seconds           Post Biometrics - 08/14/20 1504       Post  Biometrics   Height 5'  6" (1.676 m)    Weight 182 lb 12.8 oz (82.9 kg)    BMI (Calculated) 29.52    Single Leg Stand 5.27 seconds           Nutrition Therapy Plan and Nutrition Goals:  Nutrition Therapy & Goals - 07/23/20 1443      Nutrition Therapy   Diet Heart healthy, Low Na    Protein (specify units) 65g    Fiber 25 grams    Whole Grain Foods 3 servings    Saturated Fats 12 max. grams    Fruits and Vegetables 5 servings/day    Sodium 1.5 grams      Personal Nutrition Goals   Nutrition Goal ST: work with RD to try out healthier food preperations LT: keeping up with her current changes    Comments Appetite is good. prepares her own food. Baked most things, fries some things. Soul food. Discussed heart healthy eating and some techniques to make small healthy changes. Answered pt questions; whole wheat, sweet potato bread, smoked food. Pt reports gaining muscle and walking better.      Intervention Plan   Intervention Prescribe, educate and counsel  regarding individualized specific dietary modifications aiming towards targeted core components such as weight, hypertension, lipid management, diabetes, heart failure and other comorbidities.;Nutrition handout(s) given to patient.    Expected Outcomes Short Term Goal: Understand basic principles of dietary content, such as calories, fat, sodium, cholesterol and nutrients.;Short Term Goal: A plan has been developed with personal nutrition goals set during dietitian appointment.;Long Term Goal: Adherence to prescribed nutrition plan.           Nutrition Assessments:  Nutrition Assessments - 08/14/20 1708      MEDFICTS Scores   Pre Score 16           MEDIFICTS Score Key:          ?70 Need to make dietary changes          40-70 Heart Healthy Diet         ? 40 Therapeutic Level Cholesterol Diet  Nutrition Goals Re-Evaluation:  Nutrition Goals Re-Evaluation    [place] Name 06/09/20 1523 06/23/20 1548 07/21/20 1553 08/11/20 1603       Goals   Nutrition Goal Work on more nutrious snacks and food. Work on more nutrious snacks and food. Work on more nutrious snacks and food. Healthier options    Comment Hannah Martinez has already started to work on her diet. She is working on Engineer, maintenance. Hannah Martinez continues to work on her diet.  She is making better choices. Continues to work on diet and snacking. Continues to work on diet and snacking.    Expected Outcome Short: Continue to work on diet  Long: Continue to follow heart healthy diet. Short: Continue to work on diet  Long: Continue to follow heart healthy diet. Short: Continue to make changes and cut back on snacking Long; Continue to be heart healthy Continue to follow heart healthy changes.           Nutrition Goals Discharge (Final Nutrition Goals Re-Evaluation):  Nutrition Goals Re-Evaluation - 08/11/20 1603      Goals   Nutrition Goal Healthier options    Comment Continues to work on diet and snacking.    Expected Outcome Continue to follow  heart healthy changes.           Psychosocial: Target Goals: Acknowledge presence or absence of significant depression and/or stress, maximize coping skills, provide positive support system. Participant  is able to verbalize types and ability to use techniques and skills needed for reducing stress and depression.   Education: Depression - Provides group verbal and written instruction on the correlation between heart/lung disease and depressed mood, treatment options, and the stigmas associated with seeking treatment.   Education: Sleep Hygiene -Provides group verbal and written instruction about how sleep can affect your health.  Define sleep hygiene, discuss sleep cycles and impact of sleep habits. Review good sleep hygiene tips.     Education: Stress and Anxiety: - Provides group verbal and written instruction about the health risks of elevated stress and causes of high stress.  Discuss the correlation between heart/lung disease and anxiety and treatment options. Review healthy ways to manage with stress and anxiety.    Initial Review & Psychosocial Screening:  Initial Psych Review & Screening - 04/23/20 1114      Initial Review   Current issues with None Identified      Family Dynamics   Good Support System? Yes   Friends, First cousin checks on her every day and her church family     Barriers   Psychosocial barriers to participate in program There are no identifiable barriers or psychosocial needs.;The patient should benefit from training in stress management and relaxation.      Screening Interventions   Interventions Encouraged to exercise;To provide support and resources with identified psychosocial needs;Provide feedback about the scores to participant    Expected Outcomes Short Term goal: Utilizing psychosocial counselor, staff and physician to assist with identification of specific Stressors or current issues interfering with healing process. Setting desired goal for  each stressor or current issue identified.;Long Term Goal: Stressors or current issues are controlled or eliminated.;Short Term goal: Identification and review with participant of any Quality of Life or Depression concerns found by scoring the questionnaire.;Long Term goal: The participant improves quality of Life and PHQ9 Scores as seen by post scores and/or verbalization of changes           Quality of Life Scores:   Quality of Life - 08/14/20 1707      Quality of Life   Select Quality of Life      Quality of Life Scores   Health/Function Post 27.67 %    Socioeconomic Pre 28.5 %    Psych/Spiritual Pre 25.71 %    Family Pre 28.13 %    GLOBAL Pre 28.51 %          Scores of 19 and below usually indicate a poorer quality of life in these areas.  A difference of  2-3 points is a clinically meaningful difference.  A difference of 2-3 points in the total score of the Quality of Life Index has been associated with significant improvement in overall quality of life, self-image, physical symptoms, and general health in studies assessing change in quality of life.  PHQ-9: Recent Review Flowsheet Data    Depression screen [facility] 2/9 08/14/2020 06/23/2020 04/24/2020 04/24/2020   Decreased Interest 0 1 0 2   Down, Depressed, Hopeless 0 1 0 1   PHQ - 2 Score 0 2 0 3   Altered sleeping 0 0 0 2   Tired, decreased energy 0 1 1 3    Change in appetite 2 0 0 3   Feeling bad or failure about yourself  0 1 0 0   Trouble concentrating 0 1 0 0   Moving slowly or fidgety/restless 0 0 0 0   Suicidal thoughts 0 0 0 0  PHQ-9 Score 2 5 1 11    Difficult doing work/chores - Somewhat difficult Not difficult at all Somewhat difficult     Interpretation of Total Score  Total Score Depression Severity:  1-4 = Minimal depression, 5-9 = Mild depression, 10-14 = Moderate depression, 15-19 = Moderately severe depression, 20-27 = Severe depression   Psychosocial Evaluation and Intervention:  Psychosocial Evaluation  - 08/11/20 1601      Discharge Psychosocial Assessment & Intervention   Comments Hannah Martinez has done well in rehab. She is nearing graduation and has enjoyed coming to the program to exercise and meet people.  She says that she will not miss coming up her with transportation, but she will miss coming to class for her "new family".  She is already got plans in place to continue to exercise after rehab.           Psychosocial Re-Evaluation:  Psychosocial Re-Evaluation    [place] Name 06/09/20 1515 06/23/20 1520 07/21/20 1544         Psychosocial Re-Evaluation   Current issues with Current Stress Concerns;Current Sleep Concerns Current Stress Concerns Current Sleep Concerns;Current Stress Concerns     Comments Hannah Martinez is doing well mentally.  She was getting frustrated with transportation, but she thinks she finally has it straight and will talk to them about her July and August schedule this week.  She continues to cope with her limits from her stroke.  She does not sleep well, and she plans to talk to doctor about sleep.   We talked about watching sleep videos. Hannah Martinez is doing okay mentally.  She is active in her church community and leans on the support of her people and God.  Her PHQ is up from her initial, but overall she is doing okay.  She has had more down days recently.  She sleeps well.  Overall, her focus is to maintain.  She is trying to keep the negative out of her life. Hannah Martinez is doing well mentally.  She is sleeping/napping more and plans to talk with her doctor about it. She denies any major stressors currently.  She continues to stay positive and keep pushing herself to improve.  There are days she feels like quitting but notices how she is improving.     Expected Outcomes Short: watch sleep videos and talk to doctor about sleep  Long: Continue to stay positive. Short: Continue to focus on the positive  Long: Continue to cope and learn on church community. Short: Continue to focus on positive Long;  Continue to work on sleep.     Interventions -- Stress management education;Encouraged to attend Cardiac Rehabilitation for the exercise Stress management education;Encouraged to attend Cardiac Rehabilitation for the exercise     Continue Psychosocial Services  -- Follow up required by staff Follow up required by staff            Psychosocial Discharge (Final Psychosocial Re-Evaluation):  Psychosocial Re-Evaluation - 07/21/20 1544      Psychosocial Re-Evaluation   Current issues with Current Sleep Concerns;Current Stress Concerns    Comments Hannah Martinez is doing well mentally.  She is sleeping/napping more and plans to talk with her doctor about it. She denies any major stressors currently.  She continues to stay positive and keep pushing herself to improve.  There are days she feels like quitting but notices how she is improving.    Expected Outcomes Short: Continue to focus on positive Long; Continue to work on sleep.    Interventions Stress management  education;Encouraged to attend Cardiac Rehabilitation for the exercise    Continue Psychosocial Services  Follow up required by staff           Vocational Rehabilitation: Provide vocational rehab assistance to qualifying candidates.   Vocational Rehab Evaluation & Intervention:  Vocational Rehab - 04/23/20 1116      Initial Vocational Rehab Evaluation & Intervention   Assessment shows need for Vocational Rehabilitation No           Education: Education Goals: Education classes will be provided on a variety of topics geared toward better understanding of heart health and risk factor modification. Participant will state understanding/return demonstration of topics presented as noted by education test scores.  Learning Barriers/Preferences:  Learning Barriers/Preferences - 04/23/20 1116      Learning Barriers/Preferences   Learning Barriers None    Learning Preferences None           General Cardiac Education  Topics:  AED/CPR: - Group verbal and written instruction with the use of models to demonstrate the basic use of the AED with the basic ABC's of resuscitation.   Anatomy & Physiology of the Heart: - Group verbal and written instruction and models provide basic cardiac anatomy and physiology, with the coronary electrical and arterial systems. Review of Valvular disease and Heart Failure   Cardiac Procedures: - Group verbal and written instruction to review commonly prescribed medications for heart disease. Reviews the medication, class of the drug, and side effects. Includes the steps to properly store meds and maintain the prescription regimen. (beta blockers and nitrates)   Cardiac Rehab from 08/20/2020 in [facility] Cardiac and Pulmonary Rehab  Date 08/20/20  Educator [facility]  Instruction Review Code 1- Verbalizes Understanding      Cardiac Medications I: - Group verbal and written instruction to review commonly prescribed medications for heart disease. Reviews the medication, class of the drug, and side effects. Includes the steps to properly store meds and maintain the prescription regimen.   Cardiac Rehab from 08/20/2020 in [facility] Cardiac and Pulmonary Rehab  Date 07/23/20  Educator [facility]  Instruction Review Code 1- Verbalizes Understanding      Cardiac Medications II: -Group verbal and written instruction to review commonly prescribed medications for heart disease. Reviews the medication, class of the drug, and side effects. (all other drug classes)    Go Sex-Intimacy & Heart Disease, Get SMART - Goal Setting: - Group verbal and written instruction through game format to discuss heart disease and the return to sexual intimacy. Provides group verbal and written material to discuss and apply goal setting through the application of the S.M.A.R.T. Method.   Cardiac Rehab from 08/20/2020 in [facility] Cardiac and Pulmonary Rehab  Date 08/20/20  Educator [facility]  Instruction Review Code 1- Verbalizes  Understanding      Other Matters of the Heart: - Provides group verbal, written materials and models to describe Stable Angina and Peripheral Artery. Includes description of the disease process and treatment options available to the cardiac patient.   Infection Prevention: - Provides verbal and written material to individual with discussion of infection control including proper hand washing and proper equipment cleaning during exercise session.   Cardiac Rehab from 08/20/2020 in [facility] Cardiac and Pulmonary Rehab  Date 04/24/20  Educator [facility]  Instruction Review Code 1- Verbalizes Understanding      Falls Prevention: - Provides verbal and written material to individual with discussion of falls prevention and safety.   Cardiac Rehab from 08/20/2020 in [facility] Cardiac and Pulmonary  Rehab  Date 04/24/20  Educator [facility]  Instruction Review Code 1- Verbalizes Understanding      Other: -Provides group and verbal instruction on various topics (see comments)   Knowledge Questionnaire Score:  Knowledge Questionnaire Score - 08/14/20 1707      Knowledge Questionnaire Score   Post Score 17/26           Core Components/Risk Factors/Patient Goals at Admission:  Personal Goals and Risk Factors at Admission - 04/24/20 1018      Core Components/Risk Factors/Patient Goals on Admission    Weight Management Yes;Weight Loss    Intervention Weight Management: Develop a combined nutrition and exercise program designed to reach desired caloric intake, while maintaining appropriate intake of nutrient and fiber, sodium and fats, and appropriate energy expenditure required for the weight goal.;Weight Management: Provide education and appropriate resources to help participant work on and attain dietary goals.    Admit Weight 170 lb 12.8 oz (77.5 kg)    Goal Weight: Short Term 165 lb (74.8 kg)    Goal Weight: Long Term 160 lb (72.6 kg)    Expected Outcomes Short Term: Continue to assess and modify  interventions until short term weight is achieved;Long Term: Adherence to nutrition and physical activity/exercise program aimed toward attainment of established weight goal;Weight Loss: Understanding of general recommendations for a balanced deficit meal plan, which promotes 1-2 lb weight loss per week and includes a negative energy balance of [phone] kcal/d;Understanding recommendations for meals to include 15-35% energy as protein, 25-35% energy from fat, 35-60% energy from carbohydrates, less than 271m of dietary cholesterol, 20-35 gm of total fiber daily;Understanding of distribution of calorie intake throughout the day with the consumption of 4-5 meals/snacks    Heart Failure Yes    Intervention Provide a combined exercise and nutrition program that is supplemented with education, support and counseling about heart failure. Directed toward relieving symptoms such as shortness of breath, decreased exercise tolerance, and extremity edema.    Expected Outcomes Improve functional capacity of life;Short term: Attendance in program 2-3 days a week with increased exercise capacity. Reported lower sodium intake. Reported increased fruit and vegetable intake. Reports medication compliance.;Short term: Daily weights obtained and reported for increase. Utilizing diuretic protocols set by physician.;Long term: Adoption of self-care skills and reduction of barriers for early signs and symptoms recognition and intervention leading to self-care maintenance.    Lipids Yes    Intervention Provide education and support for participant on nutrition & aerobic/resistive exercise along with prescribed medications to achieve LDL <774m HDL >4018m   Expected Outcomes Short Term: Participant states understanding of desired cholesterol values and is compliant with medications prescribed. Participant is following exercise prescription and nutrition guidelines.;Long Term: Cholesterol controlled with medications as prescribed,  with individualized exercise RX and with personalized nutrition plan. Value goals: LDL < 54m60mDL > 40 mg.           Education:Diabetes - Individual verbal and written instruction to review signs/symptoms of diabetes, desired ranges of glucose level fasting, after meals and with exercise. Acknowledge that pre and post exercise glucose checks will be done for 3 sessions at entry of program.   Education: Know Your Numbers and Risk Factors: -Group verbal and written instruction about important numbers in your health.  Discussion of what are risk factors and how they play a role in the disease process.  Review of Cholesterol, Blood Pressure, Diabetes, and BMI and the role they play in your overall health.   Core Components/Risk  Factors/Patient Goals Review:   Goals and Risk Factor Review    [place] Name 06/09/20 1520 06/23/20 1511 07/21/20 1542 08/11/20 1603       Core Components/Risk Factors/Patient Goals Review   Personal Goals Review Weight Management/Obesity;Heart Failure;Lipids Weight Management/Obesity;Heart Failure;Lipids Weight Management/Obesity;Heart Failure;Lipids Weight Management/Obesity;Heart Failure;Lipids    Review Hannah Martinez is doing well in rehab.  Her weight is up and down, but she tries not to go over 180 lbs.  She denies heart failure symptoms.  She will occassionally check her pressure at home, but not since coming to class. Overall, she is doing well. Hannah Martinez is doing well in rehab.  Her BMI and weight was up some, but her doctor thinks it is fluid build up, so she has started lasix twice a day to help get the weight back down.  She denies any other heart failure symptoms. Reviewed heart failure zone action plan with her.  Blood pressures have been good, even possibly on low side.  She is doing well with her medications. Hannah Martinez has noticed that the scale is creeping up on her.  She has noticed that she ismore tired that normal and wanting to sleep more.  She feels that her pressures have been  running a little lower. She has a follow up appointment this week with her cardiologist.  She has not had any heart failure symptoms recently.  Overall, she feels good. Hannah Martinez continues to work on her weight.  She has improved her EF by 5% thus far and is pleased with her progress.  She wants to continue to get better.  She denies any current symptoms.    Expected Outcomes Short: Conitnue to work on weight loss Long; Continue to monitor risk factors. Short: Continue to work on weight loss and get fluid off Long; Continue to monitor risk factors. Short: Continue to work on weight loss Long: Continue to monitor risk factors. Continue to manage heart failure and monitor risk factors.           Core Components/Risk Factors/Patient Goals at Discharge (Final Review):   Goals and Risk Factor Review - 08/11/20 1603      Core Components/Risk Factors/Patient Goals Review   Personal Goals Review Weight Management/Obesity;Heart Failure;Lipids    Review Hannah Martinez continues to work on her weight.  She has improved her EF by 5% thus far and is pleased with her progress.  She wants to continue to get better.  She denies any current symptoms.    Expected Outcomes Continue to manage heart failure and monitor risk factors.           ITP Comments:  ITP Comments    [place] Name 04/23/20 1124 04/24/20 1009 04/28/20 1514 05/14/20 0620 06/11/20 0610   ITP Comments Virtual Orientation completed today. HAs EP eval and Gym Orientation scheduled for tomorrow am. Documentation of diagnosis can be found in [facility] 02/24/2020. Completed 6MWT and gym orientation.  Initial ITP created and sent for review to Dr. Emily Filbert, Medical Director. First full day of exercise!  Patient was oriented to gym and equipment including functions, settings, policies, and procedures.  Patient's individual exercise prescription and treatment plan were reviewed.  All starting workloads were established based on the results of the 6 minute walk test done at  initial orientation visit.  The plan for exercise progression was also introduced and progression will be customized based on patient's performance and goals. 30 Day review completed. ITP review done, changes made as directed,and approval shown by signature of  program Market researcher. New to program 30 Day review completed. Medical Director ITP review done, changes made as directed, and signed approval by Medical Director.   [place] Name 07/09/20 0927 08/06/20 0618         ITP Comments 30 Day review completed. Medical Director ITP review done, changes made as directed, and signed approval by Medical Director. 30 Day review completed. Medical Director ITP review done, changes made as directed, and signed approval by Medical Director.             Comments: Discharge ITP

## 2020-08-20 NOTE — Progress Notes (Signed)
Discharge Progress Report  Patient Details  Name: Hannah Martinez MRN: [id number] Date of Birth: 08-Mar-1956 Referring Provider:     Cardiac Rehab from 04/24/2020 in [facility] Cardiac [facility] Pulmonary [facility]  Referring Provider Ida Rogue MD       Number of Visits: 78  Reason for Discharge:  Patient reached a stable level of exercise. Patient independent in their exercise. Patient has met program and personal goals.  Smoking History:  Social History   Tobacco Use  Smoking Status Never Smoker  Smokeless Tobacco Never Used    Diagnosis:  Heart failure, systolic and diastolic, chronic (HCC)  ADL UCSD:   Initial Exercise Prescription:  Initial Exercise Prescription - 04/24/20 1000      Date of Initial Exercise RX and Referring Provider   Date 04/24/20    Referring Provider Ida Rogue MD      Treadmill   MPH 0.7    Grade 0    Minutes 15    METs 1.5      NuStep   Level 1    SPM 80    Minutes 15    METs 1      Biostep-RELP   Level 1    SPM 50    Minutes 15    METs 1      Prescription Details   Frequency (times per week) 3    Duration Progress to 30 minutes of continuous aerobic without signs/symptoms of physical distress      Intensity   THRR 40-80% of Max Heartrate 112-143    Ratings of Perceived Exertion 11-13    Perceived Dyspnea 0-4      Progression   Progression Continue to progress workloads to maintain intensity without signs/symptoms of physical distress.      Resistance Training   Training Prescription Yes    Weight 3 lb    Reps 10-15           Discharge Exercise Prescription (Final Exercise Prescription Changes):  Exercise Prescription Changes - 08/12/20 1500      Response to Exercise   Blood Pressure (Admit) 130/80    Blood Pressure (Exercise) 120/74    Blood Pressure (Exit) 118/74    Heart Rate (Admit) 75 bpm    Heart Rate (Exercise) 100 bpm    Heart Rate (Exit) 76 bpm    Rating of Perceived Exertion (Exercise) 12     Symptoms none    Duration Continue with 30 min of aerobic exercise without signs/symptoms of physical distress.    Intensity THRR unchanged      Progression   Progression Continue to progress workloads to maintain intensity without signs/symptoms of physical distress.    Average METs 2      Resistance Training   Training Prescription Yes    Weight 3 lb    Reps 10-15      Interval Training   Interval Training No      NuStep   Level 5    Minutes 15    METs 2.2      REL-XR   Level 3    Minutes 15    METs 2.2      Home Exercise Plan   Plans to continue exercise at Home (comment)   walking   Frequency Add 2 additional days to program exercise sessions.    Initial Home Exercises Provided 06/09/20           Functional Capacity:  6 Minute Walk    Row Name 04/24/20 1009 08/14/20 1503  6 Minute Walk   Phase Initial Discharge    Distance 574 feet 450 feet    Distance % Change -- -124 %    Distance Feet Change -- -21.6 ft    Walk Time 6 minutes 6 minutes    # of Rest Breaks 0 0    MPH 1.08 0.85    METS 2.25 1.57    RPE 9 13    VO2 Peak 7.86 5.5    Symptoms No Yes (comment)    Comments -- fatigue--has found that she is relying on her cane more and more    Resting HR 82 bpm 53 bpm    Resting BP 146/70 130/74    Resting Oxygen Saturation  99 % --    Exercise Oxygen Saturation  during 6 min walk 99 % --    Max Ex. HR 108 bpm 88 bpm    Max Ex. BP 146/74 134/74    2 Minute Post BP 132/60 --           Psychological, QOL, Others - Outcomes: PHQ 2/9: Depression screen [facility] 2/9 08/14/2020 06/23/2020 04/24/2020 04/24/2020  Decreased Interest 0 1 0 2  Down, Depressed, Hopeless 0 1 0 1  PHQ - 2 Score 0 2 0 3  Altered sleeping 0 0 0 2  Tired, decreased energy 0 1 1 3   Change in appetite 2 0 0 3  Feeling bad or failure about yourself  0 1 0 0  Trouble concentrating 0 1 0 0  Moving slowly or fidgety/restless 0 0 0 0  Suicidal thoughts 0 0 0 0  PHQ-9 Score 2 5 1 11    Difficult doing work/chores - Somewhat difficult Not difficult at all Somewhat difficult    Quality of Life:  Quality of Life - 08/14/20 1707      Quality of Life   Select Quality of Life      Quality of Life Scores   Health/Function Post 27.67 %    Socioeconomic Pre 28.5 %    Psych/Spiritual Pre 25.71 %    Family Pre 28.13 %    GLOBAL Pre 28.51 %          Nutrition & Weight - Outcomes:  Pre Biometrics - 04/24/20 1017      Pre Biometrics   Height 5' 6"  (1.676 m)    Weight 170 lb 12.8 oz (77.5 kg)    BMI (Calculated) 27.58    Single Leg Stand 26.72 seconds           Post Biometrics - 08/14/20 1504       Post  Biometrics   Height 5' 6"  (1.676 m)    Weight 182 lb 12.8 oz (82.9 kg)    BMI (Calculated) 29.52    Single Leg Stand 5.27 seconds           Nutrition:  Nutrition Therapy & Goals - 07/23/20 1443      Nutrition Therapy   Diet Heart healthy, Low Na    Protein (specify units) 65g    Fiber 25 grams    Whole Grain Foods 3 servings    Saturated Fats 12 max. grams    Fruits and Vegetables 5 servings/day    Sodium 1.5 grams      Personal Nutrition Goals   Nutrition Goal ST: work with RD to try out healthier food preperations LT: keeping up with her current changes    Comments Appetite is good. prepares her own food. Baked most things, fries some  things. Soul food. Discussed heart healthy eating and some techniques to make small healthy changes. Answered pt questions; whole wheat, sweet potato bread, smoked food. Pt reports gaining muscle and walking better.      Intervention Plan   Intervention Prescribe, educate and counsel regarding individualized specific dietary modifications aiming towards targeted core components such as weight, hypertension, lipid management, diabetes, heart failure and other comorbidities.;Nutrition handout(s) given to patient.    Expected Outcomes Short Term Goal: Understand basic principles of dietary content, such as calories, fat,  sodium, cholesterol and nutrients.;Short Term Goal: A plan has been developed with personal nutrition goals set during dietitian appointment.;Long Term Goal: Adherence to prescribed nutrition plan.           Nutrition Discharge:  Nutrition Assessments - 08/14/20 1708      MEDFICTS Scores   Pre Score 16           Education Questionnaire Score:  Knowledge Questionnaire Score - 08/14/20 1707      Knowledge Questionnaire Score   Post Score 17/26           Goals reviewed with patient; copy given to patient.

## 2020-09-22 NOTE — Progress Notes (Signed)
Cardiology Office Note  Date:  09/23/2020   ID:  Hannah Martinez, DOB 27-May-1956, MRN [id number]  PCP:  [facility]   Chief Complaint  Patient presents with  . Follow-up    3 Month follow up and review echo results. Medications verbally reviewed with patient.     HPI:  Hannah Martinez is a 64 y.o. female with a hx of  stroke,  cerebral aneurysm s/p clipping,  chronic pain,  hyperlipidemia  Coronary artery disease Ejection fraction 25 to 30%, was recently July 23, 2020 presenting to the hospital February 2021 with chest pain, shortness of breath   [facility], [facility].    echo 02/25/20 showing LVEF 20-25%, LV mildly dilated, akinesis in the LAD territory, RV normal size and function, mild-moderate MR.    left and right heart cath 02/25/20   chronic occluded osital LAD with right to left and left to left collaterals, mild disease (30%) to L Cx. RHC with severely elevated filling pressures - moderate pulmonary hypertension, ---->suspect LAD infarct 2 weeks prior to cath  unable to undergo cardiac MRI for cardiac viability due to metal in her head from cerebral artery aneurysm repair.   Echo 07/23/2020 Left ventricular ejection fraction, by estimation, is 25 to 30%.  In follow-up today she is not taking Entresto Reported it was too expensive, did not go back on her losartan Denies significant leg swelling, no shortness of breath or chest pain with exertion Activity limited by prior stroke, weakness on the left  She does report some tenderness on the left chest when she sits up in bed in the morning Typically goes away without intervention during the daytime Unable to move her left arm when she is sleeping, wonders if she is sleeping funny on that arm stressing the left pectoral area   PMH:   has a past medical history of Cerebral aneurysm, Chronic pain, Degenerative joint disease,  Hypercholesteremia, Stroke (HCC), and TMJ (dislocation of temporomandibular joint).  PSH:    Past Surgical History:  Procedure Laterality Date  . CEREBRAL ANEURYSM REPAIR    . RIGHT/LEFT HEART CATH AND CORONARY ANGIOGRAPHY N/A 02/25/2020   Procedure: RIGHT/LEFT HEART CATH AND CORONARY ANGIOGRAPHY;  Surgeon: Iran Ouch, MD;  Location: [facility] INVASIVE CV LAB;  Service: Cardiovascular;  Laterality: N/A;    Current Outpatient Medications  Medication Sig Dispense Refill  . benzonatate (TESSALON PERLES) 100 MG capsule Take 1 capsule (100 mg total) by mouth every 6 (six) hours as needed for cough. 30 capsule 0  . [facility] ASPIRIN ADULT LOW DOSE 81 MG chewable tablet CHEW 1 TABLET (81 MG TOTAL) BY MOUTH DAILY. 90 tablet 0  . fluticasone (FLONASE) 50 MCG/ACT nasal spray Place 1-2 sprays into both nostrils daily as needed for allergies.    . furosemide (LASIX) 40 MG tablet Take 1 tablet (40 mg total) by mouth as directed. Take 40 mg once daily and extra 1 tablet as needed for swelling 180 tablet 3  . gabapentin (NEURONTIN) 300 MG capsule Take 300 mg by mouth 3 (three) times daily. (take each capsule with 600mg  tablet to equal 900mg  three times a day)    . gabapentin (NEURONTIN) 600 MG tablet Take 600 mg by mouth 3 (three) times daily. (take each tablet with 300mg  capsule to equal 900mg  three times a day)    . HYDROcodone-acetaminophen (NORCO/VICODIN) 5-325 MG tablet Take 1 tablet by mouth every 6 (six) hours as needed for moderate pain.  10 tablet 0  . meloxicam (MOBIC) 7.5 MG tablet Take 7.5 mg by mouth daily.    . metoprolol succinate (TOPROL-XL) 25 MG 24 hr tablet Take 1 tablet (25 mg total) by mouth daily. 90 tablet 3  . omeprazole (PRILOSEC) 20 MG capsule Take 20 mg by mouth daily.    . simvastatin (ZOCOR) 20 MG tablet Take 1 tablet (20 mg total) by mouth at bedtime. 90 tablet 3  . simvastatin (ZOCOR) 40 MG tablet TAKE 1 TABLET BY MOUTH EVERYDAY AT BEDTIME 90 tablet 3  . spironolactone (ALDACTONE) 25  MG tablet Take 0.5 tablets (12.5 mg total) by mouth daily. 45 tablet 3  . tobramycin-dexamethasone (TOBRADEX) ophthalmic solution     . losartan (COZAAR) 25 MG tablet Take 1 tablet (25 mg total) by mouth daily. 90 tablet 3   No current facility-administered medications for this visit.     Allergies:   Lipitor [atorvastatin]   Social History:  The patient  reports that she has never smoked. She has never used smokeless tobacco. She reports that she does not drink alcohol and does not use drugs.   Family History:   family history includes Breast cancer in her cousin.    Review of Systems: Review of Systems  Constitutional: Negative.   HENT: Negative.   Respiratory: Negative.   Cardiovascular: Negative.   Gastrointestinal: Negative.   Musculoskeletal: Negative.   Neurological: Negative.   Psychiatric/Behavioral: Negative.   All other systems reviewed and are negative.   PHYSICAL EXAM: VS:  BP 116/86 (BP Location: Right Arm, Patient Position: Sitting, Cuff Size: Normal)   Pulse 64   Ht 5\' 6"  (1.676 m)   Wt 185 lb (83.9 kg)   SpO2 98%   BMI 29.86 kg/m  , BMI Body mass index is 29.86 kg/m. GEN: Well nourished, well developed, in no acute distress HEENT: normal Neck: no JVD, carotid bruits, or masses Cardiac: RRR; no murmurs, rubs, or gallops,no edema  Respiratory:  clear to auscultation bilaterally, normal work of breathing GI: soft, nontender, nondistended, + BS MS: no deformity or atrophy, weakness of the left arm, decreased range of motion Skin: warm and dry, no rash Neuro:  Strength and sensation are intact Psych: euthymic mood, full affect   Recent Labs: 02/27/2020: Hemoglobin 14.5; Magnesium 2.4; Platelets 375 06/17/2020: ALT 13; BNP 273.8 08/04/2020: BUN 13; Creatinine, Ser 1.09; Potassium 4.6; Sodium 141    Lipid Panel Lab Results  Component Value Date   CHOL 178 06/17/2020   HDL 54 06/17/2020   LDLCALC 102 (H) 06/17/2020   TRIG 122 06/17/2020      Wt  Readings from Last 3 Encounters:  09/23/20 185 lb (83.9 kg)  08/14/20 182 lb 12.8 oz (82.9 kg)  06/17/20 179 lb 4 oz (81.3 kg)     ASSESSMENT AND PLAN:  Problem List Items Addressed This Visit      Cardiology Problems   Coronary artery disease involving native coronary artery of native heart without angina pectoris   Relevant Medications   losartan (COZAAR) 25 MG tablet   furosemide (LASIX) 40 MG tablet   metoprolol succinate (TOPROL-XL) 25 MG 24 hr tablet   simvastatin (ZOCOR) 20 MG tablet   simvastatin (ZOCOR) 40 MG tablet   spironolactone (ALDACTONE) 25 MG tablet    Other Visit Diagnoses    Ischemic cardiomyopathy       Relevant Medications   losartan (COZAAR) 25 MG tablet   furosemide (LASIX) 40 MG tablet   metoprolol succinate (TOPROL-XL) 25  MG 24 hr tablet   simvastatin (ZOCOR) 20 MG tablet   simvastatin (ZOCOR) 40 MG tablet   spironolactone (ALDACTONE) 25 MG tablet   Chronic combined systolic and diastolic heart failure (HCC)       Relevant Medications   losartan (COZAAR) 25 MG tablet   furosemide (LASIX) 40 MG tablet   metoprolol succinate (TOPROL-XL) 25 MG 24 hr tablet   simvastatin (ZOCOR) 20 MG tablet   simvastatin (ZOCOR) 40 MG tablet   spironolactone (ALDACTONE) 25 MG tablet     Cad with stable angina No strong indication for intervention at this time No anginal symptoms, Relatively sedentary at baseline We will continue metoprolol, Lasix, spironolactone Restart losartan as she is unable to afford the Entresto  Chronic systolic and diastolic CHF Appears relatively euvolemic on today's visit Sometimes feels that she needs an extra Lasix, Prescription was adjusted to give extra  Ischemic cardiomyopathy No plan for intervention to LAD/CTO at this time given no anginal symptoms She is happy to continue medical management as she feels well  Hyperlipidemia She prefers to split her Zocor 20 in the morning 40 in the evening Denies any side effects     Total encounter time more than 25 minutes  Greater than 50% was spent in counseling and coordination of care with the patient    Signed, Dossie Arbour, M.D., Ph.D. [facility] [place], [place] [phone]

## 2020-09-23 ENCOUNTER — Encounter: Payer: Self-pay | Admitting: Cardiovascular Disease

## 2020-09-23 ENCOUNTER — Ambulatory Visit (INDEPENDENT_AMBULATORY_CARE_PROVIDER_SITE_OTHER): Payer: Medicare Other | Admitting: Cardiovascular Disease

## 2020-09-23 ENCOUNTER — Other Ambulatory Visit: Payer: Self-pay | Admitting: Family

## 2020-09-23 ENCOUNTER — Other Ambulatory Visit: Payer: Self-pay

## 2020-09-23 DIAGNOSIS — I25118 Atherosclerotic heart disease of native coronary artery with other forms of angina pectoris: Secondary | ICD-10-CM | POA: Diagnosis not present

## 2020-09-23 DIAGNOSIS — I255 Ischemic cardiomyopathy: Secondary | ICD-10-CM | POA: Diagnosis not present

## 2020-09-23 DIAGNOSIS — I5042 Chronic combined systolic (congestive) and diastolic (congestive) heart failure: Secondary | ICD-10-CM | POA: Diagnosis not present

## 2020-09-23 MED ORDER — SIMVASTATIN 20 MG PO TABS
20.0000 mg | ORAL_TABLET | Freq: Every day | ORAL | 3 refills | Status: DC
Start: 2020-09-23 — End: 2020-12-23

## 2020-09-23 MED ORDER — SPIRONOLACTONE 25 MG PO TABS
12.5000 mg | ORAL_TABLET | Freq: Every day | ORAL | 3 refills | Status: DC
Start: 2020-09-23 — End: 2020-09-23

## 2020-09-23 MED ORDER — FUROSEMIDE 40 MG PO TABS
40.0000 mg | ORAL_TABLET | ORAL | 3 refills | Status: DC
Start: 2020-09-23 — End: 2020-11-25

## 2020-09-23 MED ORDER — LOSARTAN POTASSIUM 25 MG PO TABS: 25.0000 mg | ORAL_TABLET | Freq: Every day | ORAL | 3 refills | Status: DC

## 2020-09-23 MED ORDER — METOPROLOL SUCCINATE ER 25 MG PO TB24: 25.0000 mg | ORAL_TABLET | Freq: Every day | ORAL | 3 refills | Status: DC

## 2020-09-23 MED ORDER — SIMVASTATIN 40 MG PO TABS: ORAL_TABLET | ORAL | 3 refills | Status: DC

## 2020-09-23 NOTE — Patient Instructions (Addendum)
Medication Instructions:  Restart losartan 25 mg daily Stop entresto (was too expensive)  Lasix 40 mg daily, extra lasix in the afternoon for worsening leg swelling   If you need a refill on your cardiac medications before your next appointment, please call your pharmacy.    Lab work: No new labs needed   If you have labs (blood work) drawn today and your tests are completely normal, you will receive your results only by: Marland Kitchen MyChart Message (if you have MyChart) OR . A paper copy in the mail If you have any lab test that is abnormal or we need to change your treatment, we will call you to review the results.   Testing/Procedures: No new testing needed   Follow-Up: At [facility], you and your health needs are our priority.  As part of our continuing mission to provide you with exceptional heart care, we have created designated Provider Care Teams.  These Care Teams include your primary Cardiologist (physician) and Advanced Practice Providers (APPs -  Physician Assistants and Nurse Practitioners) who all work together to provide you with the care you need, when you need it.  . You will need a follow up appointment in 3 months  . Providers on your designated Care Team:   . Nicolasa Ducking, NP . Eula Listen, PA-C . Marisue Ivan, PA-C  Any Other Special Instructions Will Be Listed Below (If Applicable).  COVID-19 Vaccine Information can be found at: [email or web address] For questions related to vaccine distribution or appointments, please email [email or web address] or call [phone].

## 2020-11-25 ENCOUNTER — Other Ambulatory Visit: Payer: Self-pay

## 2020-11-25 MED ORDER — FUROSEMIDE 40 MG PO TABS
40.0000 mg | ORAL_TABLET | ORAL | 0 refills | Status: DC
Start: 2020-11-25 — End: 2021-02-17

## 2020-11-25 MED ORDER — SPIRONOLACTONE 25 MG PO TABS
12.5000 mg | ORAL_TABLET | Freq: Every day | ORAL | 0 refills | Status: DC
Start: 2020-11-25 — End: 2020-12-01

## 2020-11-25 NOTE — Telephone Encounter (Signed)
*  STAT* If patient is at the pharmacy, call can be transferred to refill team.   1. Which medications need to be refilled? (please list name of each medication and dose if known) Spironolactone, Furosemide, Meloxicam  2. Which pharmacy/location (including street and city if local pharmacy) is medication to be sent to? [facility] [place]  3. Do they need a 30 day or 90 day supply? 90

## 2020-11-26 ENCOUNTER — Other Ambulatory Visit: Payer: Self-pay | Admitting: Cardiovascular Disease

## 2020-11-26 MED ORDER — OMEPRAZOLE 20 MG PO CPDR
20.0000 mg | DELAYED_RELEASE_CAPSULE | Freq: Every day | ORAL | 0 refills | Status: DC
Start: 2020-11-26 — End: 2021-05-11

## 2020-11-26 NOTE — Telephone Encounter (Signed)
Requested Prescriptions   Signed Prescriptions Disp Refills   omeprazole (PRILOSEC) 20 MG capsule 90 capsule 0    Sig: Take 1 capsule (20 mg total) by mouth daily.    Authorizing Provider: Antonieta Iba    Ordering User: Kendrick Fries

## 2020-11-26 NOTE — Telephone Encounter (Signed)
°*  STAT* If patient is at the pharmacy, call can be transferred to refill team.   1. Which medications need to be refilled? (please list name of each medication and dose if known) omeprazole 20 MG daily  2. Which pharmacy/location (including street and city if local pharmacy) [facility] [place]   3. Do they need a 30 day or 90 day supply? 90 day

## 2020-12-01 ENCOUNTER — Other Ambulatory Visit: Payer: Self-pay | Admitting: Cardiovascular Disease

## 2020-12-01 MED ORDER — SPIRONOLACTONE 25 MG PO TABS
12.5000 mg | ORAL_TABLET | Freq: Every day | ORAL | 0 refills | Status: DC
Start: 2020-12-01 — End: 2020-12-23

## 2020-12-01 NOTE — Telephone Encounter (Signed)
°*  STAT* If patient is at the pharmacy, call can be transferred to refill team.   1. Which medications need to be refilled? (please list name of each medication and dose if known) spironolactone 12.5 mg  2. Which pharmacy/location (including street and city if local pharmacy) is [facility] [place]  3. Do they need a 30 day or 90 day supply? 90

## 2020-12-01 NOTE — Telephone Encounter (Signed)
Requested Prescriptions   Signed Prescriptions Disp Refills   spironolactone (ALDACTONE) 25 MG tablet 45 tablet 0    Sig: Take 0.5 tablets (12.5 mg total) by mouth daily.    Authorizing Provider: Antonieta Iba    Ordering User: Thayer Headings, Coby Shrewsberry L

## 2020-12-21 NOTE — Progress Notes (Signed)
Cardiology Office Note  Date:  12/23/2020   ID:  Hannah Martinez, DOB [birth year], MRN [id number]  PCP:  [facility]   Chief Complaint  Patient presents with  . Other    3 month f/u no complaints today. Meds reviewed verbally with pt.    HPI:  Hannah Martinez is a 64 y.o. female with a hx of  stroke,  cerebral aneurysm s/p clipping,  chronic pain,  hyperlipidemia  Coronary artery disease Ejection fraction 25 to 30%, was recently July 23, 2020 presenting to the hospital February 2021 with chest pain, shortness of breath  Who presents today for follow-up of her coronary disease, cardiomyopathy  In follow-up today reports she is doing well Receiving Entresto through insurance for no cost Unclear reasons is still on losartan, Denies orthostasis symptoms, no significant leg swelling " Urinates all the time" But has trended upwards 10 pounds of the past 6 months Feels it is more from dietary  Denies any cough, PND, orthopnea Requesting lab work today  Walking with a cane  EKG personally reviewed by myself on todays visit Shows normal sinus rhythm rate 63 bpm anterior MI  Past medical history reviewed [facility], [facility] pain.   echo 02/25/20 showing LVEF 20-25%, LV mildly dilated, akinesis in the LAD territory, RV normal size and function, mild-moderate MR.   left and right heart cath 02/25/20  chronic occluded osital LAD with right to left and left to left collaterals, mild disease (30%) to L Cx. RHC with severely elevated filling pressures - moderate pulmonary hypertension, ---->suspect LAD infarct 2 weeks prior to cath  unable to undergo cardiac MRI for cardiac viability due to metal in her head from cerebral artery aneurysm repair.   Echo 07/23/2020 Left ventricular ejection fraction, by estimation, is 25 to 30%.   PMH:   has a past medical history of Cerebral aneurysm, Chronic pain,  Degenerative joint disease, Hypercholesteremia, Stroke (HCC), and TMJ (dislocation of temporomandibular joint).  PSH:    Past Surgical History:  Procedure Laterality Date  . CEREBRAL ANEURYSM REPAIR    . RIGHT/LEFT HEART CATH AND CORONARY ANGIOGRAPHY N/A 02/25/2020   Procedure: RIGHT/LEFT HEART CATH AND CORONARY ANGIOGRAPHY;  Surgeon: Iran Ouch, MD;  Location: [facility] INVASIVE CV LAB;  Service: Cardiovascular;  Laterality: N/A;    Current Outpatient Medications  Medication Sig Dispense Refill  . benzonatate (TESSALON PERLES) 100 MG capsule Take 1 capsule (100 mg total) by mouth every 6 (six) hours as needed for cough. 30 capsule 0  . CVS ASPIRIN ADULT LOW DOSE 81 MG chewable tablet CHEW 1 TABLET (81 MG TOTAL) BY MOUTH DAILY. 90 tablet 2  . ENTRESTO 24-26 MG Take 1 tablet by mouth 2 (two) times daily.    . fluticasone (FLONASE) 50 MCG/ACT nasal spray Place 1-2 sprays into both nostrils daily as needed for allergies.    . furosemide (LASIX) 40 MG tablet Take 1 tablet (40 mg total) by mouth as directed. Take 40 mg once daily and extra 1 tablet as needed for swelling 180 tablet 0  . gabapentin (NEURONTIN) 300 MG capsule Take 300 mg by mouth 3 (three) times daily. (take each capsule with 600mg  tablet to equal 900mg  three times a day)    . gabapentin (NEURONTIN) 600 MG tablet Take 600 mg by mouth 3 (three) times daily. (take each tablet with 300mg  capsule to equal 900mg  three times a day)    . meloxicam (MOBIC) 7.5 MG tablet  Take 7.5 mg by mouth daily.    . metoprolol succinate (TOPROL-XL) 25 MG 24 hr tablet Take 1 tablet (25 mg total) by mouth daily. 90 tablet 3  . omeprazole (PRILOSEC) 20 MG capsule Take 1 capsule (20 mg total) by mouth daily. 90 capsule 0  . simvastatin (ZOCOR) 40 MG tablet TAKE 1 TABLET BY MOUTH EVERYDAY AT BEDTIME 90 tablet 3  . spironolactone (ALDACTONE) 25 MG tablet Take 0.5 tablets (12.5 mg total) by mouth daily. 45 tablet 0  . tobramycin-dexamethasone (TOBRADEX)  ophthalmic solution      No current facility-administered medications for this visit.     Allergies:   Lipitor [atorvastatin]   Social History:  The patient  reports that she has never smoked. She has never used smokeless tobacco. She reports that she does not drink alcohol and does not use drugs.   Family History:   family history includes Breast cancer in her cousin.    Review of Systems: Review of Systems  Constitutional: Negative.   HENT: Negative.   Respiratory: Negative.   Cardiovascular: Negative.   Gastrointestinal: Negative.   Musculoskeletal: Negative.   Neurological: Negative.   Psychiatric/Behavioral: Negative.   All other systems reviewed and are negative.   PHYSICAL EXAM: VS:  BP 100/70 (BP Location: Right Arm, Patient Position: Sitting, Cuff Size: Normal)   Pulse 63   Ht 5\' 5"  (1.651 m)   Wt 189 lb 4 oz (85.8 kg)   SpO2 96%   BMI 31.49 kg/m  , BMI Body mass index is 31.49 kg/m. Constitutional:  oriented to person, place, and time. No distress.  HENT:  Head: Grossly normal Eyes:  no discharge. No scleral icterus.  Neck: No JVD, no carotid bruits  Cardiovascular: Regular rate and rhythm, no murmurs appreciated Pulmonary/Chest: Clear to auscultation bilaterally, no wheezes or rails Abdominal: Soft.  no distension.  no tenderness.  Musculoskeletal: Normal range of motion Neurological:  normal muscle tone. Coordination normal. No atrophy Skin: Skin warm and dry Psychiatric: normal affect, pleasant   Recent Labs: 02/27/2020: Hemoglobin 14.5; Magnesium 2.4; Platelets 375 06/17/2020: ALT 13; BNP 273.8 08/04/2020: BUN 13; Creatinine, Ser 1.09; Potassium 4.6; Sodium 141    Lipid Panel Lab Results  Component Value Date   CHOL 178 06/17/2020   HDL 54 06/17/2020   LDLCALC 102 (H) 06/17/2020   TRIG 122 06/17/2020      Wt Readings from Last 3 Encounters:  12/23/20 189 lb 4 oz (85.8 kg)  09/23/20 185 lb (83.9 kg)  08/14/20 182 lb 12.8 oz (82.9 kg)      ASSESSMENT AND PLAN:  Problem List Items Addressed This Visit      Cardiology Problems   Stroke [facility])   Relevant Medications   ENTRESTO 24-26 MG    Other Visit Diagnoses    Coronary artery disease of native artery of native heart with stable angina pectoris (HCC)    -  Primary   Relevant Medications   ENTRESTO 24-26 MG   Chronic combined systolic and diastolic heart failure (HCC)       Relevant Medications   ENTRESTO 24-26 MG   Ischemic cardiomyopathy       Relevant Medications   ENTRESTO 24-26 MG   Hyperlipidemia LDL goal <70       Relevant Medications   ENTRESTO 24-26 MG   Pulmonary hypertension, unspecified (HCC)       Relevant Medications   ENTRESTO 24-26 MG   Nonrheumatic mitral valve regurgitation  Relevant Medications   ENTRESTO 24-26 MG     Cad with stable angina Continue current medications, Simvastatin 40 daily add Zetia 10 daily to reach goal LDL less than 70  Chronic systolic and diastolic CHF Appears euvolemic, CMP ordered today She is taking Lasix 40 twice daily No significant leg swelling, no orthopnea We will stop the losartan as she is on Entresto  Ischemic cardiomyopathy No plan for intervention to LAD/CTO at this time given no anginal symptoms Stable  Hyperlipidemia Simvastatin 40 daily add Zetia    Total encounter time more than 25 minutes  Greater than 50% was spent in counseling and coordination of care with the patient    Signed, Dossie Arbour, M.D., Ph.D. [facility] [place], [place] [phone]

## 2020-12-23 ENCOUNTER — Encounter: Payer: Self-pay | Admitting: Cardiovascular Disease

## 2020-12-23 ENCOUNTER — Other Ambulatory Visit: Payer: Self-pay

## 2020-12-23 ENCOUNTER — Ambulatory Visit (INDEPENDENT_AMBULATORY_CARE_PROVIDER_SITE_OTHER): Payer: Medicare Other | Admitting: Cardiovascular Disease

## 2020-12-23 VITALS — BP 100/70 | HR 63 | Ht 65.0 in | Wt 189.2 lb

## 2020-12-23 DIAGNOSIS — I340 Nonrheumatic mitral (valve) insufficiency: Secondary | ICD-10-CM

## 2020-12-23 DIAGNOSIS — E785 Hyperlipidemia, unspecified: Secondary | ICD-10-CM | POA: Diagnosis not present

## 2020-12-23 DIAGNOSIS — I5042 Chronic combined systolic (congestive) and diastolic (congestive) heart failure: Secondary | ICD-10-CM

## 2020-12-23 DIAGNOSIS — I25118 Atherosclerotic heart disease of native coronary artery with other forms of angina pectoris: Secondary | ICD-10-CM

## 2020-12-23 DIAGNOSIS — I639 Cerebral infarction, unspecified: Secondary | ICD-10-CM

## 2020-12-23 DIAGNOSIS — I255 Ischemic cardiomyopathy: Secondary | ICD-10-CM

## 2020-12-23 DIAGNOSIS — I272 Pulmonary hypertension, unspecified: Secondary | ICD-10-CM

## 2020-12-23 MED ORDER — METOPROLOL SUCCINATE ER 25 MG PO TB24: 25.0000 mg | ORAL_TABLET | Freq: Every day | ORAL | 3 refills | Status: DC

## 2020-12-23 MED ORDER — SPIRONOLACTONE 25 MG PO TABS
12.5000 mg | ORAL_TABLET | Freq: Every day | ORAL | 3 refills | Status: DC
Start: 2020-12-23 — End: 2021-01-26

## 2020-12-23 MED ORDER — EZETIMIBE 10 MG PO TABS: 10.0000 mg | ORAL_TABLET | Freq: Every day | ORAL | 3 refills | Status: DC

## 2020-12-23 NOTE — Patient Instructions (Addendum)
Medication Instructions:  Start zetia 10 mg daily  Stop the losartan Stop the simvastatin 20, but stay on the 40 mg daily of simvastatin  If you need a refill on your cardiac medications before your next appointment, please call your pharmacy.    Lab work: CMP   If your tests are completely normal, you will receive your results only by:  MyChart Message (if you have MyChart) OR  A paper copy in the mail If you have any lab test that is abnormal or we need to change your treatment, we will call you to review the results.   Testing/Procedures: No new testing needed   Follow-Up: At [facility], you and your health needs are our priority.  As part of our continuing mission to provide you with exceptional heart care, we have created designated Provider Care Teams.  These Care Teams include your primary Cardiologist (physician) and Advanced Practice Providers (APPs -  Physician Assistants and Nurse Practitioners) who all work together to provide you with the care you need, when you need it.   You will need a follow up appointment in 6 months   Providers on your designated Care Team:    Nicolasa Ducking, NP  Eula Listen, PA-C  Marisue Ivan, PA-C  Any Other Special Instructions Will Be Listed Below (If Applicable).  COVID-19 Vaccine Information can be found at: PodExchange.nl For questions related to vaccine distribution or appointments, please email [email or web address] [email or web address] or call [phone].

## 2020-12-24 LAB — COMPREHENSIVE METABOLIC PANEL
ALT: 11 IU/L (ref 0–32)
AST: 12 IU/L (ref 0–40)
Albumin/Globulin Ratio: 1.5 (ref 1.2–2.2)
Albumin: 4.4 g/dL (ref 3.8–4.8)
Alkaline Phosphatase: 117 IU/L (ref 44–121)
BUN/Creatinine Ratio: 16 (ref 12–28)
BUN: 14 mg/dL (ref 8–27)
Bilirubin Total: 0.6 mg/dL (ref 0.0–1.2)
CO2: 19 mmol/L — ABNORMAL LOW (ref 20–29)
Calcium: 9.6 mg/dL (ref 8.7–10.3)
Chloride: 104 mmol/L (ref 96–106)
Creatinine, Ser: 0.9 mg/dL (ref 0.57–1.00)
GFR calc Af Amer: 79 mL/min/{1.73_m2} (ref >59)
GFR calc non Af Amer: 68 mL/min/{1.73_m2} (ref >59)
Globulin, Total: 3 g/dL (ref 1.5–4.5)
Glucose: 102 mg/dL — ABNORMAL HIGH (ref 65–99)
Potassium: 4 mmol/L (ref 3.5–5.2)
Sodium: 138 mmol/L (ref 134–144)
Total Protein: 7.4 g/dL (ref 6.0–8.5)

## 2020-12-29 ENCOUNTER — Telehealth: Payer: Self-pay

## 2020-12-29 NOTE — Telephone Encounter (Signed)
Left VM stating labs on 12/28 was WNL. Advised to call office for any questions or clarity

## 2021-01-05 ENCOUNTER — Encounter: Payer: Self-pay | Admitting: Cardiovascular Disease

## 2021-01-18 ENCOUNTER — Other Ambulatory Visit: Payer: Self-pay | Admitting: Cardiovascular Disease

## 2021-01-19 NOTE — Telephone Encounter (Signed)
Please review for a refill on omeprazole.  Does Dr. Mariah Milling want to continue the omeprazole?

## 2021-01-19 NOTE — Telephone Encounter (Signed)
Do not see clearly where Dr Mariah Milling intiated the medication.  Refused with message to contact PCP.

## 2021-01-26 ENCOUNTER — Telehealth: Payer: Self-pay | Admitting: Cardiovascular Disease

## 2021-01-26 MED ORDER — SPIRONOLACTONE 25 MG PO TABS: 12.5000 mg | ORAL_TABLET | Freq: Every day | ORAL | 3 refills | Status: DC

## 2021-01-26 NOTE — Telephone Encounter (Signed)
Rx request sent to pharmacy.  

## 2021-01-26 NOTE — Addendum Note (Signed)
Addended by: Ileene Musa D on: 01/26/2021 01:27 PM   Modules accepted: Orders

## 2021-01-26 NOTE — Telephone Encounter (Signed)
*  STAT* If patient is at the pharmacy, call can be transferred to refill team.   1. Which medications need to be refilled? (please list name of each medication and dose if known) spirolactone   2. Which pharmacy/location (including street and city if local pharmacy) [facility] [place]  3. Do they need a 30 day or 90 day supply? 90

## 2021-02-16 ENCOUNTER — Other Ambulatory Visit: Payer: Self-pay | Admitting: Cardiovascular Disease

## 2021-03-10 ENCOUNTER — Telehealth: Payer: Self-pay | Admitting: Cardiovascular Disease

## 2021-03-10 ENCOUNTER — Other Ambulatory Visit: Payer: Self-pay | Admitting: Internal Medicine

## 2021-03-10 ENCOUNTER — Other Ambulatory Visit: Payer: Self-pay

## 2021-03-10 DIAGNOSIS — Z1231 Encounter for screening mammogram for malignant neoplasm of breast: Secondary | ICD-10-CM

## 2021-03-10 MED ORDER — SPIRONOLACTONE 25 MG PO TABS: 12.5000 mg | ORAL_TABLET | Freq: Every day | ORAL | 3 refills | Status: DC

## 2021-03-10 MED ORDER — EZETIMIBE 10 MG PO TABS: 10.0000 mg | ORAL_TABLET | Freq: Every day | ORAL | 1 refills | Status: DC

## 2021-03-10 NOTE — Telephone Encounter (Signed)
*  STAT* If patient is at the pharmacy, call can be transferred to refill team.   1. Which medications need to be refilled? (please list name of each medication and dose if known) spirolactone  2. Which pharmacy/location (including street and city if local pharmacy) [facility] [place]  3. Do they need a 30 day or 90 day supply? 90

## 2021-03-10 NOTE — Telephone Encounter (Signed)
spironolactone (ALDACTONE) 25 MG tablet 45 tablet 3 03/10/2021    Sig - Route: Take 0.5 tablets (12.5 mg total) by mouth daily. - Oral   Sent to pharmacy as: spironolactone (ALDACTONE) 25 MG tablet   E-Prescribing Status: Receipt confirmed by pharmacy (03/10/2021 10:53 AM EDT)     Pharmacy  [id number] [place] - [address]

## 2021-03-10 NOTE — Telephone Encounter (Signed)
*  STAT* If patient is at the pharmacy, call can be transferred to refill team.   1. Which medications need to be refilled? (please list name of each medication and dose if known) ezetimibe 10 MG 1 tablet daily   2. Which pharmacy/location (including street and city if local pharmacy) is [facility] to[facility] [place]   3. Do they need a 30 day or 90 day supply? 90 day

## 2021-03-27 ENCOUNTER — Other Ambulatory Visit: Payer: Self-pay

## 2021-03-27 ENCOUNTER — Ambulatory Visit: Payer: Medicare Other | Attending: Internal Medicine

## 2021-04-30 ENCOUNTER — Telehealth: Payer: Self-pay | Admitting: Cardiovascular Disease

## 2021-04-30 ENCOUNTER — Other Ambulatory Visit: Payer: Self-pay

## 2021-04-30 ENCOUNTER — Ambulatory Visit
Admission: RE | Admit: 2021-04-30 | Discharge: 2021-04-30 | Disposition: A | Discharge status: Home / Self Care | Payer: Medicare Other | Source: Ambulatory Visit | Attending: Internal Medicine | Admitting: Internal Medicine

## 2021-04-30 DIAGNOSIS — Z1231 Encounter for screening mammogram for malignant neoplasm of breast: Secondary | ICD-10-CM | POA: Insufficient documentation

## 2021-04-30 MED ORDER — EZETIMIBE 10 MG PO TABS: 10.0000 mg | ORAL_TABLET | Freq: Every day | ORAL | 0 refills | Status: DC

## 2021-04-30 NOTE — Telephone Encounter (Signed)
Requested Prescriptions   Signed Prescriptions Disp Refills   ezetimibe (ZETIA) 10 MG tablet 90 tablet 0    Sig: Take 1 tablet (10 mg total) by mouth daily.    Authorizing Provider: GOLLAN, TIMOTHY J    Ordering User: NEWCOMER MCCLAIN, Keiffer Piper L    

## 2021-04-30 NOTE — Telephone Encounter (Signed)
*  STAT* If patient is at the pharmacy, call can be transferred to refill team.   1. Which medications need to be refilled? (please list name of each medication and dose if known) Ezetimibe  2. Which pharmacy/location (including street and city if local pharmacy) is [facility] to[facility] [place]  3. Do they need a 30 day or 90 day supply? 90 day

## 2021-05-11 ENCOUNTER — Other Ambulatory Visit: Payer: Self-pay | Admitting: Cardiovascular Disease

## 2021-05-19 ENCOUNTER — Telehealth: Payer: Self-pay | Admitting: Cardiovascular Disease

## 2021-05-19 ENCOUNTER — Other Ambulatory Visit: Payer: Self-pay | Admitting: Cardiovascular Disease

## 2021-05-19 DIAGNOSIS — I255 Ischemic cardiomyopathy: Secondary | ICD-10-CM

## 2021-05-19 DIAGNOSIS — I5042 Chronic combined systolic (congestive) and diastolic (congestive) heart failure: Secondary | ICD-10-CM

## 2021-05-19 DIAGNOSIS — I25118 Atherosclerotic heart disease of native coronary artery with other forms of angina pectoris: Secondary | ICD-10-CM

## 2021-05-19 MED ORDER — METOPROLOL SUCCINATE ER 25 MG PO TB24: 25.0000 mg | ORAL_TABLET | Freq: Every day | ORAL | 0 refills | Status: DC

## 2021-05-19 MED ORDER — SPIRONOLACTONE 25 MG PO TABS: 12.5000 mg | ORAL_TABLET | Freq: Every day | ORAL | 0 refills | Status: DC

## 2021-05-19 NOTE — Telephone Encounter (Signed)
Requested Prescriptions   Signed Prescriptions Disp Refills   spironolactone (ALDACTONE) 25 MG tablet 45 tablet 0    Sig: Take 0.5 tablets (12.5 mg total) by mouth daily.    Authorizing Provider: Antonieta Iba    Ordering User: Iverson Alamin C   metoprolol succinate (TOPROL-XL) 25 MG 24 hr tablet 90 tablet 0    Sig: Take 1 tablet (25 mg total) by mouth daily.    Authorizing Provider: Antonieta Iba    Ordering User: Kendrick Fries

## 2021-05-19 NOTE — Telephone Encounter (Signed)
*  STAT* If patient is at the pharmacy, call can be transferred to refill team.   1. Which medications need to be refilled? (please list name of each medication and dose if known)     Metoprolol 25 mg po q d  Spironolactone 12.5 mg po q d   2. Which pharmacy/location (including street and city if local pharmacy) is [facility] to? [facility] [address] [place]   3. Do they need a 30 day or 90 day supply? 90

## 2021-06-17 IMAGING — CR DG CHEST 2V
2 series · 2 of 2 positions shown · non-contrast
Comparison: 11/22/2019

CLINICAL DATA: Wheezing, headache, cough, shortness of breath

EXAM:
CHEST - 2 VIEW

[chest pa]
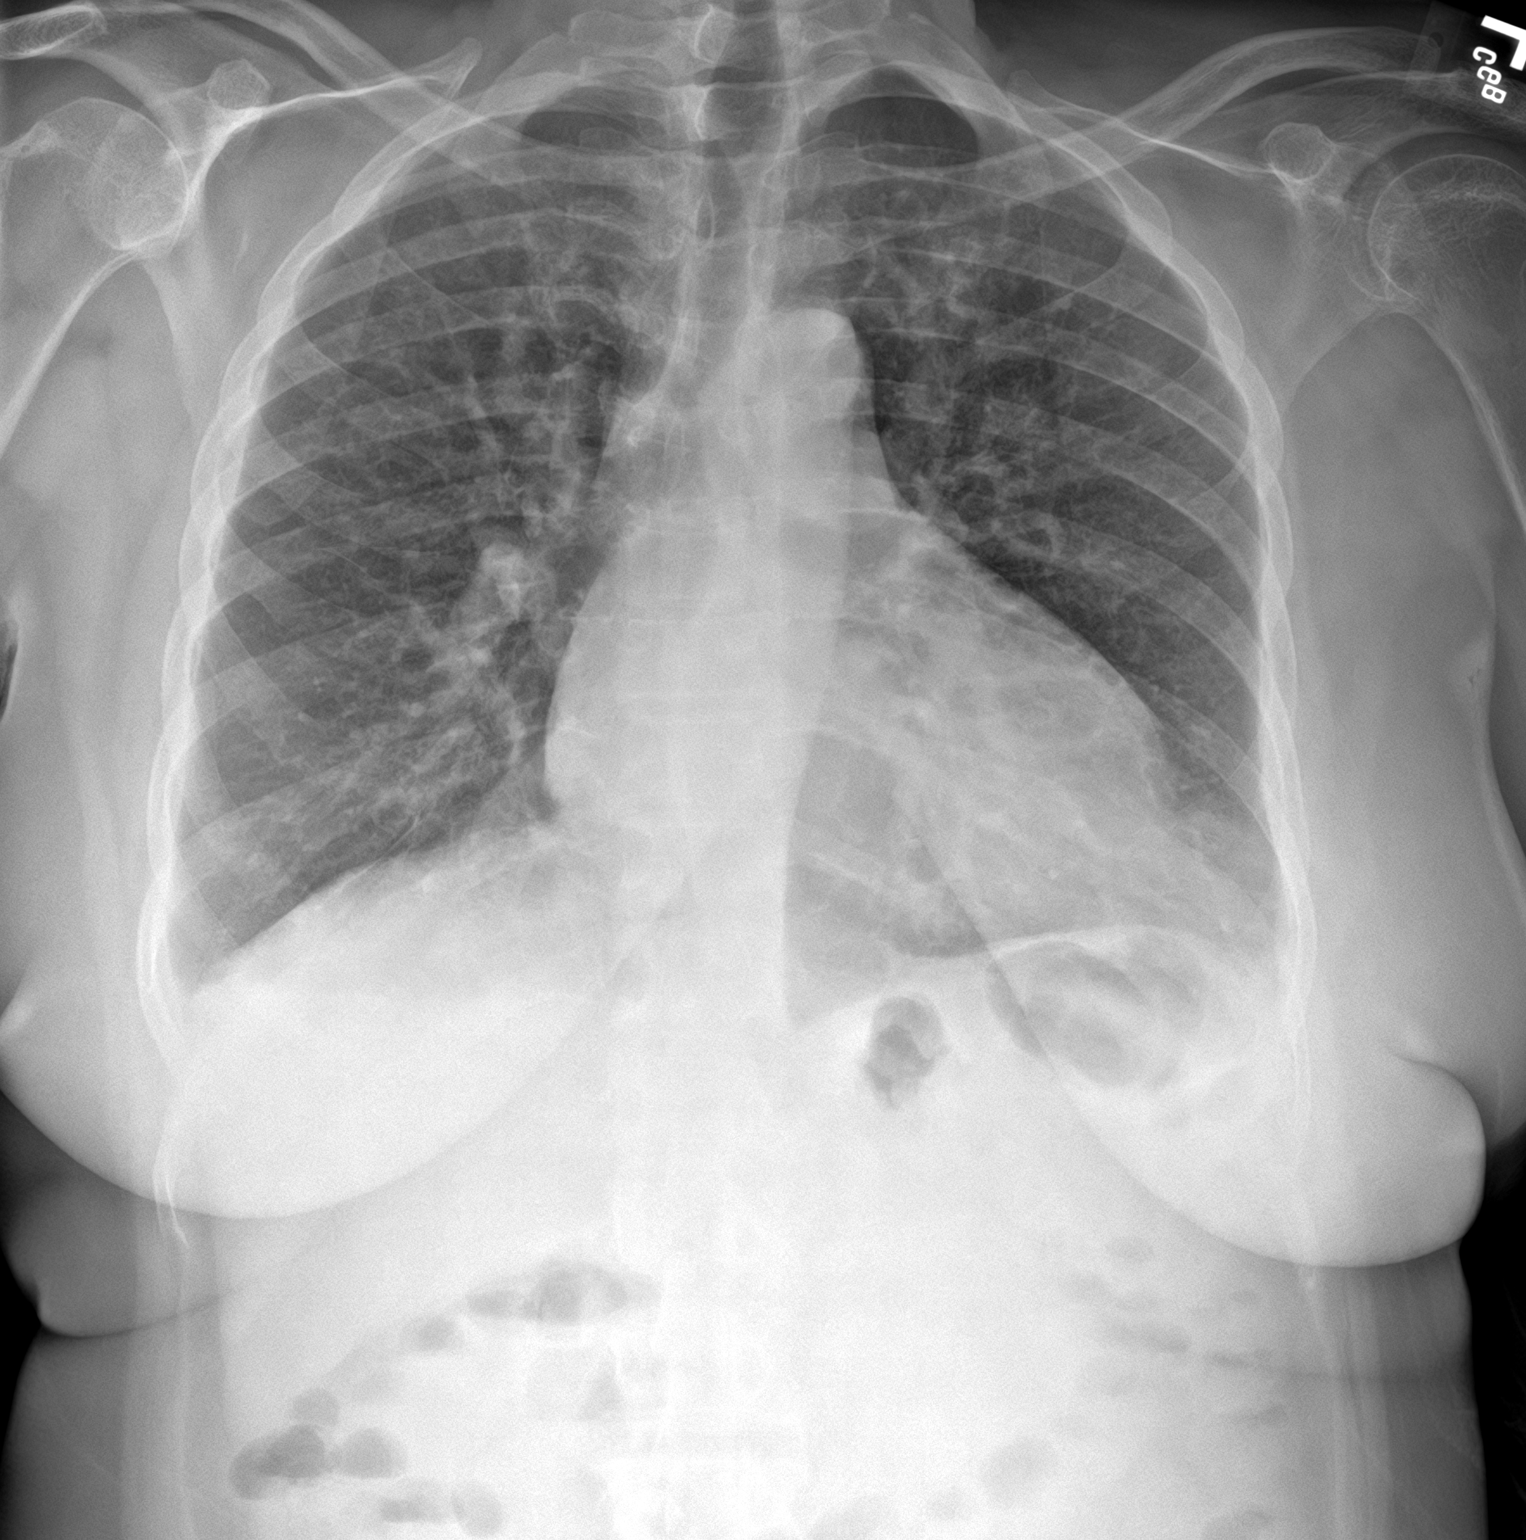

[chest lat]
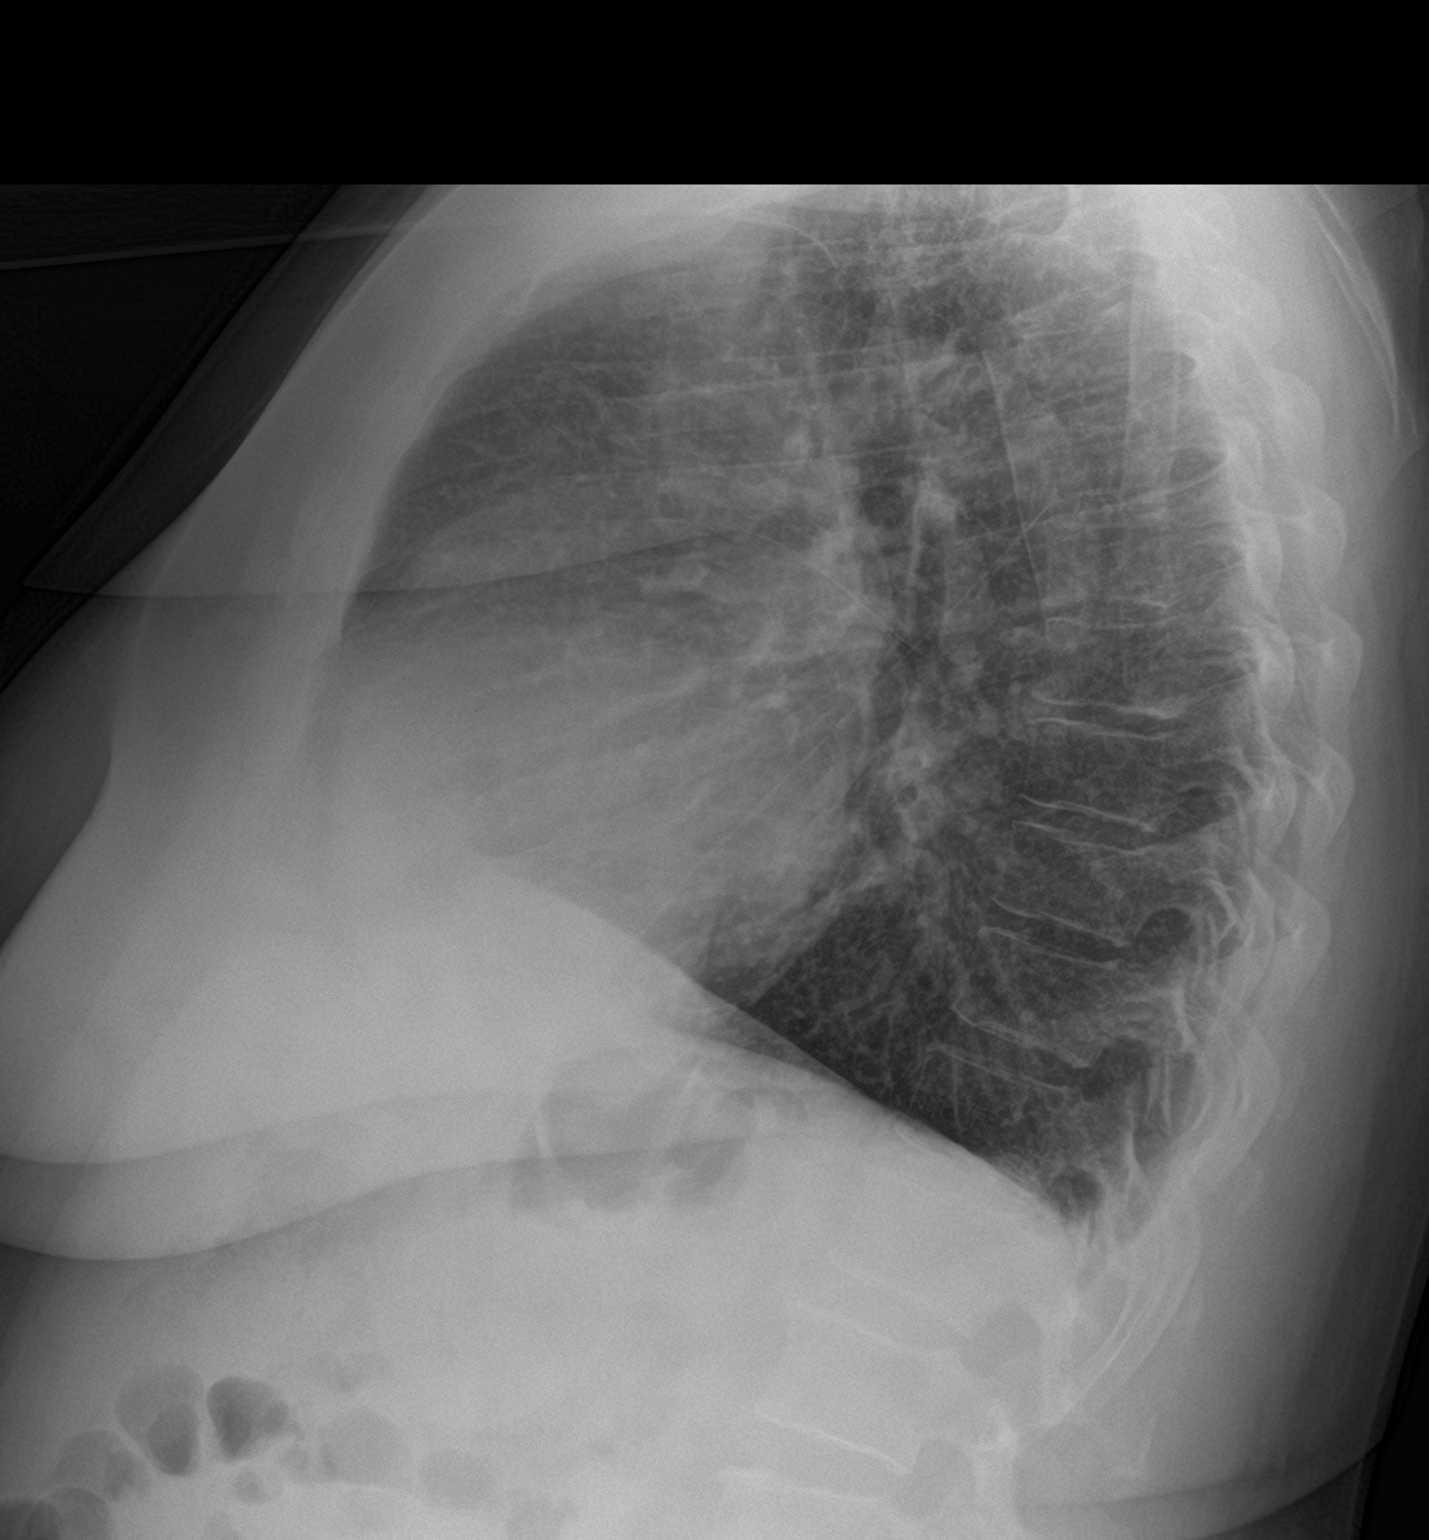

[2 of 2 positions shown; findings below may reference images not displayed]

FINDINGS: There is mild bilateral interstitial thickening. The lungs are
mildly hyperinflated. There is no focal consolidation. There is no
pleural effusion or pneumothorax. The cardiomediastinal silhouette
is stable.

There is no acute osseous abnormality.
IMPRESSION: No acute cardiopulmonary disease.

## 2021-07-01 ENCOUNTER — Ambulatory Visit: Payer: Medicare Other | Admitting: Cardiovascular Disease

## 2021-07-14 ENCOUNTER — Other Ambulatory Visit: Payer: Self-pay

## 2021-07-14 ENCOUNTER — Encounter: Payer: Self-pay | Admitting: Cardiovascular Disease

## 2021-07-14 ENCOUNTER — Ambulatory Visit (INDEPENDENT_AMBULATORY_CARE_PROVIDER_SITE_OTHER): Payer: Medicare Other | Admitting: Cardiovascular Disease

## 2021-07-14 VITALS — BP 104/69 | HR 66 | Ht 65.0 in | Wt 186.5 lb

## 2021-07-14 DIAGNOSIS — I5042 Chronic combined systolic (congestive) and diastolic (congestive) heart failure: Secondary | ICD-10-CM | POA: Diagnosis not present

## 2021-07-14 DIAGNOSIS — I25118 Atherosclerotic heart disease of native coronary artery with other forms of angina pectoris: Secondary | ICD-10-CM | POA: Diagnosis not present

## 2021-07-14 DIAGNOSIS — I5021 Acute systolic (congestive) heart failure: Secondary | ICD-10-CM

## 2021-07-14 DIAGNOSIS — Z79899 Other long term (current) drug therapy: Secondary | ICD-10-CM

## 2021-07-14 DIAGNOSIS — I255 Ischemic cardiomyopathy: Secondary | ICD-10-CM

## 2021-07-14 MED ORDER — FUROSEMIDE 40 MG PO TABS: ORAL_TABLET | ORAL | 3 refills | Status: DC

## 2021-07-14 MED ORDER — EMPAGLIFLOZIN 10 MG PO TABS: 10.0000 mg | ORAL_TABLET | Freq: Every day | ORAL | 11 refills | Status: DC

## 2021-07-14 MED ORDER — SIMVASTATIN 40 MG PO TABS: ORAL_TABLET | ORAL | 3 refills | Status: DC

## 2021-07-14 MED ORDER — SPIRONOLACTONE 25 MG PO TABS: 12.5000 mg | ORAL_TABLET | Freq: Every day | ORAL | 3 refills | Status: DC

## 2021-07-14 MED ORDER — METOPROLOL SUCCINATE ER 25 MG PO TB24: 25.0000 mg | ORAL_TABLET | Freq: Every day | ORAL | 3 refills | Status: DC

## 2021-07-14 MED ORDER — EZETIMIBE 10 MG PO TABS: 10.0000 mg | ORAL_TABLET | Freq: Every day | ORAL | 3 refills | Status: DC

## 2021-07-14 NOTE — Patient Instructions (Addendum)
Medication Instructions:  Jardiance 10 mg daily Free samples given (4 bottles) Lot [id number] Exp 04/2023 [employer] savings card  Free 14-day trial card Patient assistance forms give Please complete and bring them back to the office  If you need a refill on your cardiac medications before your next appointment, please call your pharmacy.   Lab work: No new labs needed  Testing/Procedures: Echo (2-3 months)  Follow-Up: At [facility], you and your health needs are our priority.  As part of our continuing mission to provide you with exceptional heart care, we have created designated Provider Care Teams.  These Care Teams include your primary Cardiologist (physician) and Advanced Practice Providers (APPs -  Physician Assistants and Nurse Practitioners) who all work together to provide you with the care you need, when you need it.  You will need a follow up appointment in 3 months after echo (October)  Providers on your designated Care Team:   Nicolasa Ducking, NP Eula Listen, PA-C Marisue Ivan, PA-C Cadence Fransico Michael, [place]  Any Other Special Instructions Will Be Listed Below (If Applicable) COVID-19 Vaccine Information can be found at: PodExchange.nl For questions related to vaccine distribution or appointments, please email [email or web address] or call [phone].    Your physician has requested that you have an echocardiogram. Echocardiography is a painless test that uses sound waves to create images of your heart. It provides your doctor with information about the size and shape of your heart and how well your heart's chambers and valves are working. This procedure takes approximately one hour. There are no restrictions for this procedure.  There is a possibility that an IV may need to be started during your test to inject an image enhancing agent. This is done to obtain more optimal pictures of your heart. Therefore we  ask that you do at least drink some water prior to coming in to hydrate your veins.

## 2021-07-14 NOTE — Progress Notes (Signed)
Cardiology Office Note  Date:  07/14/2021   ID:  Hannah Martinez, DOB 11-07-1956, MRN [id number]  [facility]P:  [facility]   Chief Complaint  Patient presents with   Other    6 month f/u no complaints today. Meds reviewed verbally with pt.    HPI:  Hannah Martinez is a 65 y.o. female with a hx of  stroke,  cerebral aneurysm s/p clipping,  chronic pain,  hyperlipidemia  Coronary artery disease, occluded proximal LAD 02/2020 Ejection fraction 25 to 30%, July 23, 2020 presenting to the hospital February 2021 with chest pain, shortness of breath  Who presents today for follow-up of her coronary disease, cardiomyopathy  In follow-up today she presents with her grandchild which she helps babysit weight stable, denies abdominal distention, leg swelling Takes Lasix daily  Receiving Entresto through insurance for no cost Also on spironolactone, metoprolol succinate No recent echocardiogram since Entresto started Hoping she does not need defibrillator Interested in other medication options  Denies any cough, PND, orthopnea Requesting lab work today  Walking with a cane  EKG personally reviewed by myself on todays visit Shows normal sinus rhythm rate 66 bpm anterior MI  Past medical history reviewed Admitted 02/24/2020-03/25/2020 to [facility] after presenting with cough, wheezing, chest pain.   echo 02/25/20 showing LVEF 20-25%, LV mildly dilated, akinesis in the LAD territory, RV normal size and function, mild-moderate MR.   left and right heart cath 02/25/20  chronic occluded osital LAD with right to left and left to left collaterals, mild disease (30%) to L Cx. RHC with severely elevated filling pressures - moderate pulmonary hypertension, ---->suspect LAD infarct 2 weeks prior to cath  unable to undergo cardiac MRI for cardiac viability due to metal in her head from cerebral artery aneurysm repair.   Echo 07/23/2020 Left ventricular ejection fraction, by  estimation, is 25 to 30%.   PMH:   has a past medical history of Cerebral aneurysm, Chronic pain, Degenerative joint disease, Hypercholesteremia, Stroke (HCC), and TMJ (dislocation of temporomandibular joint).  PSH:    Past Surgical History:  Procedure Laterality Date   CEREBRAL ANEURYSM REPAIR     RIGHT/LEFT HEART CATH AND CORONARY ANGIOGRAPHY N/A 02/25/2020   Procedure: RIGHT/LEFT HEART CATH AND CORONARY ANGIOGRAPHY;  Surgeon: Iran Ouch, MD;  Location: [facility] INVASIVE CV LAB;  Service: Cardiovascular;  Laterality: N/A;    Current Outpatient Medications  Medication Sig Dispense Refill   [facility] ASPIRIN ADULT LOW DOSE 81 MG chewable tablet CHEW 1 TABLET (81 MG TOTAL) BY MOUTH DAILY. 90 tablet 2   empagliflozin (JARDIANCE) 10 MG TABS tablet Take 1 tablet (10 mg total) by mouth daily before breakfast. 30 tablet 11   ENTRESTO 24-26 MG TAKE 1 TABLET BY MOUTH TWICE A DAY 180 tablet 0   fluticasone (FLONASE) 50 MCG/ACT nasal spray Place 1-2 sprays into both nostrils daily as needed for allergies.     gabapentin (NEURONTIN) 300 MG capsule Take 300 mg by mouth 3 (three) times daily. (take each capsule with 600mg  tablet to equal 900mg  three times a day)     gabapentin (NEURONTIN) 600 MG tablet Take 600 mg by mouth 3 (three) times daily. (take each tablet with 300mg  capsule to equal 900mg  three times a day)     meloxicam (MOBIC) 7.5 MG tablet Take 7.5 mg by mouth daily.     omeprazole (PRILOSEC) 20 MG capsule TAKE 1 CAPSULE BY MOUTH EVERY DAY 90 capsule 0   tobramycin-dexamethasone (TOBRADEX) ophthalmic solution  ezetimibe (ZETIA) 10 MG tablet Take 1 tablet (10 mg total) by mouth daily. 90 tablet 3   furosemide (LASIX) 40 MG tablet TAKE 1 TABLET BY MOUTH AS DIRECTED ONCE DAILY AND EXTRA 1 TABLET AS NEEDED FOR SWELLING 180 tablet 3   metoprolol succinate (TOPROL-XL) 25 MG 24 hr tablet Take 1 tablet (25 mg total) by mouth daily. 90 tablet 3   simvastatin (ZOCOR) 40 MG tablet TAKE 1 TABLET BY MOUTH  EVERYDAY AT BEDTIME 90 tablet 3   spironolactone (ALDACTONE) 25 MG tablet Take 0.5 tablets (12.5 mg total) by mouth daily. 45 tablet 3   No current facility-administered medications for this visit.    Allergies:   Lipitor [atorvastatin]   Social History:  The patient  reports that she has never smoked. She has never used smokeless tobacco. She reports that she does not drink alcohol and does not use drugs.   Family History:   family history includes Breast cancer in her cousin.    Review of Systems: Review of Systems  Constitutional: Negative.   HENT: Negative.    Respiratory: Negative.    Cardiovascular: Negative.   Gastrointestinal: Negative.   Musculoskeletal: Negative.   Neurological: Negative.   Psychiatric/Behavioral: Negative.    All other systems reviewed and are negative.  PHYSICAL EXAM: VS:  BP 104/69 (BP Location: Right Arm, Patient Position: Sitting, Cuff Size: Normal)   Pulse 66   Ht 5\' 5"  (1.651 m)   Wt 186 lb 8 oz (84.6 kg)   SpO2 96%   BMI 31.04 kg/m  , BMI Body mass index is 31.04 kg/m. Constitutional:  oriented to person, place, and time. No distress.  HENT:  Head: Grossly normal Eyes:  no discharge. No scleral icterus.  Neck: No JVD, no carotid bruits  Cardiovascular: Regular rate and rhythm, no murmurs appreciated Pulmonary/Chest: Clear to auscultation bilaterally, no wheezes or rails Abdominal: Soft.  no distension.  no tenderness.  Musculoskeletal: Normal range of motion Neurological:  normal muscle tone. Coordination normal. No atrophy Skin: Skin warm and dry Psychiatric: normal affect, pleasant   Recent Labs: 12/23/2020: ALT 11; BUN 14; Creatinine, Ser 0.90; Potassium 4.0; Sodium 138    Lipid Panel Lab Results  Component Value Date   CHOL 178 06/17/2020   HDL 54 06/17/2020   LDLCALC 102 (H) 06/17/2020   TRIG 122 06/17/2020      Wt Readings from Last 3 Encounters:  07/14/21 186 lb 8 oz (84.6 kg)  12/23/20 189 lb 4 oz (85.8 kg)   09/23/20 185 lb (83.9 kg)     ASSESSMENT AND PLAN:  Problem List Items Addressed This Visit       Cardiology Problems   Acute systolic heart failure (HCC)   Relevant Medications   ezetimibe (ZETIA) 10 MG tablet   furosemide (LASIX) 40 MG tablet   metoprolol succinate (TOPROL-XL) 25 MG 24 hr tablet   simvastatin (ZOCOR) 40 MG tablet   spironolactone (ALDACTONE) 25 MG tablet   Other Relevant Orders   EKG 12-Lead   ECHOCARDIOGRAM COMPLETE   Other Visit Diagnoses     Medication management    -  Primary   Relevant Orders   ECHOCARDIOGRAM COMPLETE   Coronary artery disease of native artery of native heart with stable angina pectoris (HCC)       Relevant Medications   ezetimibe (ZETIA) 10 MG tablet   furosemide (LASIX) 40 MG tablet   metoprolol succinate (TOPROL-XL) 25 MG 24 hr tablet   simvastatin (ZOCOR) 40 MG  tablet   spironolactone (ALDACTONE) 25 MG tablet   Chronic combined systolic and diastolic heart failure (HCC)       Relevant Medications   ezetimibe (ZETIA) 10 MG tablet   furosemide (LASIX) 40 MG tablet   metoprolol succinate (TOPROL-XL) 25 MG 24 hr tablet   simvastatin (ZOCOR) 40 MG tablet   spironolactone (ALDACTONE) 25 MG tablet   Ischemic cardiomyopathy       Relevant Medications   ezetimibe (ZETIA) 10 MG tablet   furosemide (LASIX) 40 MG tablet   metoprolol succinate (TOPROL-XL) 25 MG 24 hr tablet   simvastatin (ZOCOR) 40 MG tablet   spironolactone (ALDACTONE) 25 MG tablet      Cad with stable angina Continue current medications, Denies anginal symptoms Simvastatin 40 daily Zetia 10 mg daily Numbers at goal  Chronic systolic and diastolic CHF Continue losartan, spironolactone, metoprolol succinate Will add Jardiance 10 mg daily, will need company assistance Followed with echocardiogram in 2 to 3 months  Ischemic cardiomyopathy No plan for intervention to LAD/CTO  Denies symptoms  Hyperlipidemia Simvastatin 40 daily Zetia    Total encounter  time more than 25 minutes  Greater than 50% was spent in counseling and coordination of care with the patient    Signed, Dossie Arbour, M.D., Ph.D. [facility] [place], [place] [phone]

## 2021-07-28 ENCOUNTER — Other Ambulatory Visit: Payer: Self-pay | Admitting: Cardiovascular Disease

## 2021-08-28 ENCOUNTER — Other Ambulatory Visit: Payer: Self-pay | Admitting: *Deleted

## 2021-08-28 MED ORDER — SIMVASTATIN 40 MG PO TABS: ORAL_TABLET | ORAL | 0 refills | Status: DC

## 2021-08-28 MED ORDER — ENTRESTO 24-26 MG PO TABS: 1.0000 | ORAL_TABLET | Freq: Two times a day (BID) | ORAL | 0 refills | Status: DC

## 2021-10-06 ENCOUNTER — Other Ambulatory Visit: Payer: Self-pay | Admitting: Cardiovascular Disease

## 2021-10-06 ENCOUNTER — Encounter: Payer: Self-pay | Admitting: Internal Medicine

## 2021-10-06 DIAGNOSIS — I5021 Acute systolic (congestive) heart failure: Secondary | ICD-10-CM

## 2021-10-07 ENCOUNTER — Encounter: Admission: RE | Payer: Self-pay | Source: Home / Self Care

## 2021-10-07 ENCOUNTER — Encounter: Payer: Self-pay | Admitting: Certified Registered"

## 2021-10-07 ENCOUNTER — Ambulatory Visit: Admission: RE | Admit: 2021-10-07 | Payer: Medicare Other | Source: Home / Self Care | Admitting: Internal Medicine

## 2021-10-07 HISTORY — DX: Gastro-esophageal reflux disease without esophagitis: K21.9

## 2021-10-07 SURGERY — COLONOSCOPY WITH PROPOFOL
Anesthesia: General

## 2021-10-14 ENCOUNTER — Other Ambulatory Visit: Payer: Self-pay

## 2021-10-14 ENCOUNTER — Ambulatory Visit (INDEPENDENT_AMBULATORY_CARE_PROVIDER_SITE_OTHER): Payer: Medicare Other

## 2021-10-14 DIAGNOSIS — I5021 Acute systolic (congestive) heart failure: Secondary | ICD-10-CM

## 2021-10-14 LAB — ECHOCARDIOGRAM COMPLETE
AR max vel: 2.21 cm2
AV Area VTI: 2.25 cm2
AV Area mean vel: 1.95 cm2
AV Mean grad: 2 mmHg
AV Peak grad: 3.6 mmHg
Ao pk vel: 0.95 m/s
Area-P 1/2: 4.12 cm2

## 2021-10-19 ENCOUNTER — Ambulatory Visit (INDEPENDENT_AMBULATORY_CARE_PROVIDER_SITE_OTHER): Payer: Medicare Other | Admitting: Cardiovascular Disease

## 2021-10-19 ENCOUNTER — Encounter: Payer: Self-pay | Admitting: Cardiovascular Disease

## 2021-10-19 ENCOUNTER — Other Ambulatory Visit: Payer: Self-pay

## 2021-10-19 ENCOUNTER — Telehealth: Payer: Self-pay

## 2021-10-19 VITALS — BP 104/64 | HR 69 | Ht 65.0 in | Wt 184.2 lb

## 2021-10-19 DIAGNOSIS — I639 Cerebral infarction, unspecified: Secondary | ICD-10-CM

## 2021-10-19 DIAGNOSIS — I255 Ischemic cardiomyopathy: Secondary | ICD-10-CM

## 2021-10-19 DIAGNOSIS — E785 Hyperlipidemia, unspecified: Secondary | ICD-10-CM

## 2021-10-19 DIAGNOSIS — I5042 Chronic combined systolic (congestive) and diastolic (congestive) heart failure: Secondary | ICD-10-CM | POA: Diagnosis not present

## 2021-10-19 DIAGNOSIS — I25118 Atherosclerotic heart disease of native coronary artery with other forms of angina pectoris: Secondary | ICD-10-CM | POA: Diagnosis not present

## 2021-10-19 DIAGNOSIS — Z79899 Other long term (current) drug therapy: Secondary | ICD-10-CM

## 2021-10-19 DIAGNOSIS — I340 Nonrheumatic mitral (valve) insufficiency: Secondary | ICD-10-CM

## 2021-10-19 DIAGNOSIS — I272 Pulmonary hypertension, unspecified: Secondary | ICD-10-CM

## 2021-10-19 MED ORDER — ROSUVASTATIN CALCIUM 40 MG PO TABS: 40.0000 mg | ORAL_TABLET | Freq: Every day | ORAL | 3 refills | Status: DC

## 2021-10-19 NOTE — Telephone Encounter (Addendum)
Pt dropped off her PA application for Jardiance  This RN completed provider portion, Dr. Mariah Milling signed application, attach insurance info and medication list   Faxed to [facility], application in file cabinet.   Pt was given 4 boxes of Samples in July, delay PA application that was given at that time

## 2021-10-19 NOTE — Progress Notes (Signed)
Cardiology Office Note  Date:  10/19/2021   ID:  Hannah Martinez, DOB Oct 04, 1956, MRN [id number]  PCP:  [facility]   Chief Complaint  Patient presents with   Follow up Echo     "Doing well." Medications reviewed by the patient verbally.     HPI:  Hannah Martinez is a 65 y.o. female with a hx of  stroke, left arm and leg leg cerebral aneurysm s/p clipping,  chronic pain,  hyperlipidemia  Coronary artery disease, occluded proximal LAD 02/2020 Ejection fraction 25 to 30%, July 23, 2020, unchanged in 2022 presenting to the hospital February 2021 with chest pain, shortness of breath  Who presents today for follow-up of her coronary disease, cardiomyopathy  Feels well, Sleeps a lot, is to bed at 4 PM, sleeps until 10 AM Up for couple hours and goes back to bed for a while Feels that she walks enough, walks to her neighbors  Uses a cane, residual weakness on the left from her stroke Denies any falls Trace leg swelling, slightly worse on the left Weight stable, Lasix daily, sometimes extra Lasix for leg swelling  Echo 09/2021 reviewed on today's visit  1. Left ventricular ejection fraction, by estimation, is 25 to 30%. The  left ventricle has severely decreased function. The left ventricle  demonstrates regional wall motion abnormalities (see scoring  diagram/findings for description). Left ventricular  diastolic parameters are consistent with Grade I diastolic dysfunction  (impaired relaxation). There is akinesis of the left ventricular, entire  septal wall.   Past medical history reviewed Admitted 02/24/2020-03/25/2020 to [facility] after presenting with cough, wheezing, chest pain.   echo 02/25/20 showing LVEF 20-25%, LV mildly dilated, akinesis in the LAD territory, RV normal size and function, mild-moderate MR.   left and right heart cath 02/25/20  chronic occluded osital LAD with right to left and left to left collaterals, mild disease (30%) to L Cx. RHC  with severely elevated filling pressures - moderate pulmonary hypertension, ---->suspect LAD infarct 2 weeks prior to cath  unable to undergo cardiac MRI for cardiac viability due to metal in her head from cerebral artery aneurysm repair.   Echo 07/23/2020 Left ventricular ejection fraction, by estimation, is 25 to 30%.   PMH:   has a past medical history of Cerebral aneurysm, Chronic pain, Degenerative joint disease, GERD (gastroesophageal reflux disease), Hypercholesteremia, Stroke (HCC), and TMJ (dislocation of temporomandibular joint).  PSH:    Past Surgical History:  Procedure Laterality Date   CEREBRAL ANEURYSM REPAIR     RIGHT/LEFT HEART CATH AND CORONARY ANGIOGRAPHY N/A 02/25/2020   Procedure: RIGHT/LEFT HEART CATH AND CORONARY ANGIOGRAPHY;  Surgeon: Iran Ouch, MD;  Location: [facility] INVASIVE CV LAB;  Service: Cardiovascular;  Laterality: N/A;    Current Outpatient Medications  Medication Sig Dispense Refill   benzonatate (TESSALON) 100 MG capsule Take 100 mg by mouth 3 (three) times daily as needed for cough.     [facility] ASPIRIN ADULT LOW DOSE 81 MG chewable tablet CHEW 1 TABLET (81 MG TOTAL) BY MOUTH DAILY. 90 tablet 2   empagliflozin (JARDIANCE) 10 MG TABS tablet Take 1 tablet (10 mg total) by mouth daily before breakfast. 30 tablet 11   ezetimibe (ZETIA) 10 MG tablet Take 1 tablet (10 mg total) by mouth daily. 90 tablet 3   fluticasone (FLONASE) 50 MCG/ACT nasal spray Place 1-2 sprays into both nostrils daily as needed for allergies.     fluticasone-salmeterol (ADVAIR) 500-50 MCG/ACT AEPB Inhale 1 puff into the  lungs in the morning and at bedtime.     furosemide (LASIX) 40 MG tablet TAKE 1 TABLET BY MOUTH AS DIRECTED ONCE DAILY AND EXTRA 1 TABLET AS NEEDED FOR SWELLING 180 tablet 3   gabapentin (NEURONTIN) 300 MG capsule Take 300 mg by mouth 3 (three) times daily. (take each capsule with 600mg  tablet to equal 900mg  three times a day)     gabapentin (NEURONTIN) 600 MG tablet  Take 600 mg by mouth 3 (three) times daily. (take each tablet with 300mg  capsule to equal 900mg  three times a day)     meloxicam (MOBIC) 7.5 MG tablet Take 7.5 mg by mouth daily.     metoprolol succinate (TOPROL-XL) 25 MG 24 hr tablet Take 1 tablet (25 mg total) by mouth daily. 90 tablet 3   omeprazole (PRILOSEC) 20 MG capsule TAKE 1 CAPSULE BY MOUTH EVERY DAY 90 capsule 0   sacubitril-valsartan (ENTRESTO) 24-26 MG Take 1 tablet by mouth 2 (two) times daily. 180 tablet 0   simvastatin (ZOCOR) 40 MG tablet TAKE 1 TABLET BY MOUTH EVERYDAY AT BEDTIME 90 tablet 0   spironolactone (ALDACTONE) 25 MG tablet Take 0.5 tablets (12.5 mg total) by mouth daily. 45 tablet 3   tobramycin-dexamethasone (TOBRADEX) ophthalmic solution      No current facility-administered medications for this visit.    Allergies:   Lipitor [atorvastatin]   Social History:  The patient  reports that she has never smoked. She has never used smokeless tobacco. She reports that she does not drink alcohol and does not use drugs.   Family History:   family history includes Breast cancer in her cousin.    Review of Systems: Review of Systems  Constitutional: Negative.   HENT: Negative.    Respiratory: Negative.    Cardiovascular: Negative.   Gastrointestinal: Negative.   Musculoskeletal: Negative.   Neurological: Negative.   Psychiatric/Behavioral: Negative.    All other systems reviewed and are negative.  PHYSICAL EXAM: VS:  BP 104/64 (BP Location: Left Arm, Patient Position: Sitting, Cuff Size: Normal)   Pulse 69   Ht 5\' 5"  (1.651 m)   Wt 184 lb 4 oz (83.6 kg)   SpO2 98%   BMI 30.66 kg/m  , BMI Body mass index is 30.66 kg/m. Constitutional:  oriented to person, place, and time. No distress.  HENT:  Head: Grossly normal Eyes:  no discharge. No scleral icterus.  Neck: No JVD, no carotid bruits  Cardiovascular: Regular rate and rhythm, no murmurs appreciated Pulmonary/Chest: Clear to auscultation bilaterally, no  wheezes or rails Abdominal: Soft.  no distension.  no tenderness.  Musculoskeletal: Decreased range of motion left arm Weakness left arm left leg Neurological:  normal muscle tone. Coordination normal. No atrophy Skin: Skin warm and dry Psychiatric: normal affect, pleasant   Recent Labs: 12/23/2020: ALT 11; BUN 14; Creatinine, Ser 0.90; Potassium 4.0; Sodium 138    Lipid Panel Lab Results  Component Value Date   CHOL 178 06/17/2020   HDL 54 06/17/2020   LDLCALC 102 (H) 06/17/2020   TRIG 122 06/17/2020      Wt Readings from Last 3 Encounters:  10/19/21 184 lb 4 oz (83.6 kg)  07/14/21 186 lb 8 oz (84.6 kg)  12/23/20 189 lb 4 oz (85.8 kg)     ASSESSMENT AND PLAN:  Problem List Items Addressed This Visit       Cardiology Problems   Stroke [facility])   Other Visit Diagnoses     Coronary artery disease of native artery  of native heart with stable angina pectoris (HCC)    -  Primary   Chronic combined systolic and diastolic heart failure (HCC)       Ischemic cardiomyopathy       Hyperlipidemia LDL goal <70       Pulmonary hypertension, unspecified (HCC)       Nonrheumatic mitral valve regurgitation          Cad with stable angina Currently with no symptoms of angina. No further workup at this time. Continue current medication regimen. Change the simvastatin to Crestor 40 daily, continue Zetia 10 mg daily Numbers not at goal  Chronic systolic and diastolic CHF Continue losartan, spironolactone, metoprolol succinate Jardiance 10 mg daily, will need company assistance Stable ejection fraction 25 to 30%, reports that she feels well Lasix daily, extra as needed  Ischemic cardiomyopathy No plan for intervention to LAD/CTO  Denies anginal symptoms  Hyperlipidemia Crestor 40 denies daily Zetia daily    Total encounter time more than 25 minutes  Greater than 50% was spent in counseling and coordination of care with the patient    Signed, Dossie Arbour, M.D.,  Ph.D. [facility] [place], [place] [phone]

## 2021-10-19 NOTE — Patient Instructions (Addendum)
Medication Instructions:  Please STOP simvastatin Please START crestor 40 mg daily Stay on zetia  If you need a refill on your cardiac medications before your next appointment, please call your pharmacy.   Lab work: Lipids in 3 months, jan 2023  Testing/Procedures: No new testing needed  Follow-Up: At [facility], you and your health needs are our priority.  As part of our continuing mission to provide you with exceptional heart care, we have created designated Provider Care Teams.  These Care Teams include your primary Cardiologist (physician) and Advanced Practice Providers (APPs -  Physician Assistants and Nurse Practitioners) who all work together to provide you with the care you need, when you need it.  You will need a follow up appointment in 12 months  Providers on your designated Care Team:   Nicolasa Ducking, NP Eula Listen, PA-C Cadence Fransico Michael, [place]    COVID-19 Vaccine Information can be found at: [email or web address] For questions related to vaccine distribution or appointments, please email [email or web address] or call [phone].

## 2021-10-28 ENCOUNTER — Telehealth: Payer: Self-pay

## 2021-10-28 MED ORDER — EMPAGLIFLOZIN 10 MG PO TABS: 10.0000 mg | ORAL_TABLET | Freq: Every day | ORAL | 3 refills | Status: DC

## 2021-10-28 NOTE — Telephone Encounter (Signed)
Received fax from [facility] PA Program PA for London Pepper was DENIED   "We are unable to enroll your patient in the program and provide your patient medication for the following reason: Patient has Medicaid"  [facility], [facility] (mailed order).  Called [facility], spoke with representative, they advised pt canceled their services. So they cannot fill her script.   [facility], resent the script for Jardiacne 10 mg 90-day supply with 3 refills. Cost will be $0 per pharmacist. They will call and let pt know it is ready for pick-up.

## 2021-11-16 ENCOUNTER — Other Ambulatory Visit: Payer: Self-pay | Admitting: Cardiovascular Disease

## 2022-01-12 ENCOUNTER — Encounter: Payer: Self-pay | Admitting: Internal Medicine

## 2022-01-13 ENCOUNTER — Encounter: Payer: Self-pay | Admitting: Anesthesiology

## 2022-01-13 ENCOUNTER — Ambulatory Visit: Admission: RE | Admit: 2022-01-13 | Payer: Medicare Other | Source: Home / Self Care | Admitting: Internal Medicine

## 2022-01-13 ENCOUNTER — Encounter: Admission: RE | Payer: Self-pay | Source: Home / Self Care

## 2022-01-13 SURGERY — COLONOSCOPY WITH PROPOFOL
Anesthesia: General

## 2022-01-13 NOTE — Anesthesia Preprocedure Evaluation (Deleted)
Anesthesia Evaluation    Airway        Dental   Pulmonary           Cardiovascular + CAD    Ischemic cardiomyopathy, EF 25-30%   Neuro/Psych Cerebral aneurysm s/p repair; chronic pain CVA (residual left-sided weakness)    GI/Hepatic GERD  ,  Endo/Other    Renal/GU      Musculoskeletal   Abdominal   Peds  Hematology   Anesthesia Other Findings Reviewed 10/19/21 cardiology note.  Reproductive/Obstetrics                             Anesthesia Physical Anesthesia Plan  ASA: 4  Anesthesia Plan: General   Post-op Pain Management:    Induction: Intravenous  PONV Risk Score and Plan: 3 and Propofol infusion, TIVA and Treatment may vary due to age or medical condition  Airway Management Planned: Natural Airway  Additional Equipment:   Intra-op Plan:   Post-operative Plan:   Informed Consent: I have reviewed the patients History and Physical, chart, labs and discussed the procedure including the risks, benefits and alternatives for the proposed anesthesia with the patient or authorized representative who has indicated his/her understanding and acceptance.       Plan Discussed with: CRNA  Anesthesia Plan Comments: (LMA/GETA backup discussed.  Patient consented for risks of anesthesia including but not limited to:  - adverse reactions to medications - damage to eyes, teeth, lips or other oral mucosa - nerve damage due to positioning  - sore throat or hoarseness - damage to heart, brain, nerves, lungs, other parts of body or loss of life  Informed patient about role of CRNA in peri- and intra-operative care.  Patient voiced understanding.)        Anesthesia Quick Evaluation

## 2022-01-18 ENCOUNTER — Other Ambulatory Visit: Payer: Self-pay | Admitting: Cardiovascular Disease

## 2022-01-18 ENCOUNTER — Telehealth: Payer: Self-pay | Admitting: Cardiovascular Disease

## 2022-01-18 DIAGNOSIS — I255 Ischemic cardiomyopathy: Secondary | ICD-10-CM

## 2022-01-18 DIAGNOSIS — I25118 Atherosclerotic heart disease of native coronary artery with other forms of angina pectoris: Secondary | ICD-10-CM

## 2022-01-18 DIAGNOSIS — I5022 Chronic systolic (congestive) heart failure: Secondary | ICD-10-CM

## 2022-01-18 DIAGNOSIS — I5042 Chronic combined systolic (congestive) and diastolic (congestive) heart failure: Secondary | ICD-10-CM

## 2022-01-18 DIAGNOSIS — I208 Other forms of angina pectoris: Secondary | ICD-10-CM

## 2022-01-18 NOTE — Telephone Encounter (Signed)
Patient states she would like to restart Cardiac Rehab. Please call to discuss.

## 2022-01-20 NOTE — Telephone Encounter (Signed)
Cardiac rehab order placed- will need to be signed by MD. Ames Coupe to Dr. Mariah Milling to please co-sign the order.   Attempted to contact the patient to let her know Dr. Mariah Milling had approved her request for Cardiac Rehab. The number listed in the patient's chart is incorrect and takes you to menu option for a possible business.

## 2022-01-20 NOTE — Telephone Encounter (Signed)
Hannah Iba, MD  Sent: Wed January 20, 2022  1:35 PM  To: [facility] Triage          Message  We can place referral to cardiac rehab  Chronic stable angina, known coronary artery disease, occluded vessel  Also has diagnosis of chronic systolic CHF  Thx  TG

## 2022-04-07 ENCOUNTER — Other Ambulatory Visit: Payer: Self-pay | Admitting: Family Medicine

## 2022-04-08 ENCOUNTER — Other Ambulatory Visit: Payer: Self-pay | Admitting: Family Medicine

## 2022-04-12 ENCOUNTER — Other Ambulatory Visit: Payer: Self-pay | Admitting: Family Medicine

## 2022-04-13 ENCOUNTER — Other Ambulatory Visit: Payer: Self-pay | Admitting: Family Medicine

## 2022-04-13 DIAGNOSIS — N644 Mastodynia: Secondary | ICD-10-CM

## 2022-04-13 DIAGNOSIS — N6310 Unspecified lump in the right breast, unspecified quadrant: Secondary | ICD-10-CM

## 2022-04-20 ENCOUNTER — Other Ambulatory Visit: Payer: Self-pay | Admitting: Cardiovascular Disease

## 2022-04-20 DIAGNOSIS — I25118 Atherosclerotic heart disease of native coronary artery with other forms of angina pectoris: Secondary | ICD-10-CM

## 2022-04-20 DIAGNOSIS — I5042 Chronic combined systolic (congestive) and diastolic (congestive) heart failure: Secondary | ICD-10-CM

## 2022-04-20 DIAGNOSIS — I255 Ischemic cardiomyopathy: Secondary | ICD-10-CM

## 2022-04-22 ENCOUNTER — Other Ambulatory Visit: Payer: Self-pay | Admitting: Cardiovascular Disease

## 2022-05-12 ENCOUNTER — Other Ambulatory Visit: Payer: Medicare Other

## 2022-05-28 ENCOUNTER — Other Ambulatory Visit: Payer: Medicare Other

## 2022-06-02 ENCOUNTER — Encounter: Payer: Self-pay | Admitting: Gastroenterology

## 2022-06-03 ENCOUNTER — Encounter: Payer: Self-pay | Admitting: Gastroenterology

## 2022-06-03 ENCOUNTER — Encounter: Admission: RE | Payer: Self-pay | Source: Home / Self Care

## 2022-06-03 ENCOUNTER — Ambulatory Visit: Admission: RE | Admit: 2022-06-03 | Payer: 59 | Source: Home / Self Care | Admitting: Gastroenterology

## 2022-06-03 SURGERY — COLONOSCOPY
Anesthesia: General

## 2022-07-05 ENCOUNTER — Other Ambulatory Visit: Payer: Self-pay | Admitting: Cardiovascular Disease

## 2022-08-20 ENCOUNTER — Telehealth: Payer: Self-pay | Admitting: Cardiovascular Disease

## 2022-08-20 NOTE — Telephone Encounter (Signed)
Spoke with Tomi from [facility] who is notifying our office that she [facility] and [facility] has not picked up rosuvastatin in ~1 month. Also pt is 112 days late on picking up Jardiance.   Per chart review 10/2021 pt had applied for patient assistance and was denied for Jardiance.   Tomi states that rosuvastatin prescription was sent to mail order at [facility] and they will send her rosuvastatin.   So only medication to report pt not taking is Jardiance.  Attempted to call pt to discuss further. No answer. Lmtcb to discuss medication.   Will forward to Dr. Mariah Milling to make aware pt not taking at this time.

## 2022-08-20 NOTE — Telephone Encounter (Signed)
Caller is reporting the patient has not picked up her prescriptions for  rosuvastatin (CRESTOR) 40 MG tablet  (1 month late) and empagliflozin (JARDIANCE) 10 MG TABS tablet   (112 days late) and these a heart medications.

## 2022-08-24 ENCOUNTER — Other Ambulatory Visit: Payer: Self-pay | Admitting: Cardiovascular Disease

## 2022-08-24 DIAGNOSIS — I5042 Chronic combined systolic (congestive) and diastolic (congestive) heart failure: Secondary | ICD-10-CM

## 2022-08-24 DIAGNOSIS — I255 Ischemic cardiomyopathy: Secondary | ICD-10-CM

## 2022-08-24 DIAGNOSIS — I25118 Atherosclerotic heart disease of native coronary artery with other forms of angina pectoris: Secondary | ICD-10-CM

## 2022-08-25 NOTE — Telephone Encounter (Signed)
Pt returning nurse's call. Please advise

## 2022-08-25 NOTE — Telephone Encounter (Signed)
Called number left by patient [phone] and it was said to be out of service. Called other number on file [phone] and left message to call us back.

## 2022-09-01 ENCOUNTER — Other Ambulatory Visit: Payer: Self-pay | Admitting: Cardiovascular Disease

## 2022-09-01 NOTE — Telephone Encounter (Signed)
Attempted to call the patient at home primary #. No answer- I left a message to please call back.   In reviewing her chart- there is a message from 10/28/21 stating: October 28, 2021 Maple Hudson, RN    10/28/21  1:02 PM Note Received fax from [facility] PA Program PA for London Pepper was DENIED    "We are unable to enroll your patient in the program and provide your patient medication for the following reason: Patient has Medicaid"   [facility], [facility] (mailed order).   Called [facility], spoke with representative, they advised pt canceled their services. So they cannot fill her script.    [facility], resent the script for Jardiacne 10 mg 90-day supply with 3 refills. Cost will be $0 per pharmacist. They will call and let pt know it is ready for pick-up.

## 2022-09-13 ENCOUNTER — Encounter
Admission: RE | Disposition: A | Discharge status: Home / Self Care | Payer: Self-pay | Source: Home / Self Care | Attending: Gastroenterology

## 2022-09-13 ENCOUNTER — Encounter: Payer: Self-pay | Admitting: Gastroenterology

## 2022-09-13 ENCOUNTER — Ambulatory Visit: Payer: Medicare Other | Admitting: Certified Registered"

## 2022-09-13 ENCOUNTER — Ambulatory Visit
Admission: RE | Admit: 2022-09-13 | Discharge: 2022-09-13 | Disposition: A | Discharge status: Home / Self Care | Payer: Medicare Other | Attending: Gastroenterology | Admitting: Gastroenterology

## 2022-09-13 DIAGNOSIS — R1310 Dysphagia, unspecified: Secondary | ICD-10-CM | POA: Insufficient documentation

## 2022-09-13 DIAGNOSIS — K2950 Unspecified chronic gastritis without bleeding: Secondary | ICD-10-CM | POA: Diagnosis not present

## 2022-09-13 DIAGNOSIS — Z1211 Encounter for screening for malignant neoplasm of colon: Secondary | ICD-10-CM | POA: Diagnosis present

## 2022-09-13 DIAGNOSIS — I10 Essential (primary) hypertension: Secondary | ICD-10-CM | POA: Diagnosis not present

## 2022-09-13 DIAGNOSIS — G8929 Other chronic pain: Secondary | ICD-10-CM | POA: Diagnosis not present

## 2022-09-13 DIAGNOSIS — K222 Esophageal obstruction: Secondary | ICD-10-CM | POA: Diagnosis not present

## 2022-09-13 DIAGNOSIS — K635 Polyp of colon: Secondary | ICD-10-CM | POA: Insufficient documentation

## 2022-09-13 DIAGNOSIS — K449 Diaphragmatic hernia without obstruction or gangrene: Secondary | ICD-10-CM | POA: Insufficient documentation

## 2022-09-13 DIAGNOSIS — I252 Old myocardial infarction: Secondary | ICD-10-CM | POA: Insufficient documentation

## 2022-09-13 DIAGNOSIS — K640 First degree hemorrhoids: Secondary | ICD-10-CM | POA: Insufficient documentation

## 2022-09-13 DIAGNOSIS — K219 Gastro-esophageal reflux disease without esophagitis: Secondary | ICD-10-CM | POA: Insufficient documentation

## 2022-09-13 DIAGNOSIS — E7800 Pure hypercholesterolemia, unspecified: Secondary | ICD-10-CM | POA: Insufficient documentation

## 2022-09-13 DIAGNOSIS — K5730 Diverticulosis of large intestine without perforation or abscess without bleeding: Secondary | ICD-10-CM | POA: Diagnosis not present

## 2022-09-13 DIAGNOSIS — Z87891 Personal history of nicotine dependence: Secondary | ICD-10-CM | POA: Diagnosis not present

## 2022-09-13 DIAGNOSIS — K2980 Duodenitis without bleeding: Secondary | ICD-10-CM | POA: Insufficient documentation

## 2022-09-13 DIAGNOSIS — K625 Hemorrhage of anus and rectum: Secondary | ICD-10-CM | POA: Diagnosis not present

## 2022-09-13 DIAGNOSIS — I2510 Atherosclerotic heart disease of native coronary artery without angina pectoris: Secondary | ICD-10-CM | POA: Diagnosis not present

## 2022-09-13 HISTORY — DX: Acute myocardial infarction, unspecified: I21.9

## 2022-09-13 HISTORY — PX: COLONOSCOPY: SHX5424

## 2022-09-13 HISTORY — PX: ESOPHAGOGASTRODUODENOSCOPY: SHX5428

## 2022-09-13 SURGERY — COLONOSCOPY
Anesthesia: General

## 2022-09-13 MED ORDER — SODIUM CHLORIDE 0.9 % IV SOLN: INTRAVENOUS | Status: DC

## 2022-09-13 MED ORDER — LIDOCAINE HCL (CARDIAC) PF 100 MG/5ML IV SOSY
PREFILLED_SYRINGE | INTRAVENOUS | Status: DC | PRN
  Administered 2022-09-13: 50 mg via INTRAVENOUS

## 2022-09-13 MED ORDER — PROPOFOL 10 MG/ML IV BOLUS
INTRAVENOUS | Status: DC | PRN
  Administered 2022-09-13: 70 mg via INTRAVENOUS

## 2022-09-13 MED ORDER — PROPOFOL 500 MG/50ML IV EMUL
INTRAVENOUS | Status: DC | PRN
  Administered 2022-09-13: 125 ug/kg/min via INTRAVENOUS

## 2022-09-13 NOTE — Transfer of Care (Signed)
Immediate Anesthesia Transfer of Care Note  Patient: Hannah Martinez  Procedure(s) Performed: COLONOSCOPY ESOPHAGOGASTRODUODENOSCOPY (EGD)  Patient Location: PACU  Anesthesia Type:MAC  Level of Consciousness: drowsy  Airway & Oxygen Therapy: Patient Spontanous Breathing  Post-op Assessment: Report given to RN and Post -op Vital signs reviewed and stable  Post vital signs: stable  Last Vitals:  Vitals Value Taken Time  BP 113/76 09/13/22 1025  Temp    Pulse 73 09/13/22 1025  Resp 28 09/13/22 1025  SpO2 98 % 09/13/22 1025  Vitals shown include unvalidated device data.  Last Pain:  Vitals:   09/13/22 0848  TempSrc: Temporal         Complications: No notable events documented.

## 2022-09-13 NOTE — Interval H&P Note (Signed)
History and Physical Interval Note: Preprocedure H&P from 09/13/22  was reviewed and there was no interval change after seeing and examining the patient.  Written consent was obtained from the patient after discussion of risks, benefits, and alternatives. Patient has consented to proceed with Esophagogastroduodenoscopy and Colonoscopy with possible intervention   09/13/2022 9:30 AM  Hannah Martinez  has presented today for surgery, with the diagnosis of Colon cancer screening (Z12.11) Gastroesophageal reflux disease, unspecified whether esophagitis present (K21.9) Dysphagia, unspecified type (R13.10).  The various methods of treatment have been discussed with the patient and family. After consideration of risks, benefits and other options for treatment, the patient has consented to  Procedure(s) with comments: COLONOSCOPY (N/A) - Patient given arrival of 8:30 AM due to transportation - per Tori ESOPHAGOGASTRODUODENOSCOPY (EGD) (N/A) as a surgical intervention.  The patient's history has been reviewed, patient examined, no change in status, stable for surgery.  I have reviewed the patient's chart and labs.  Questions were answered to the patient's satisfaction.     [name] [name]   

## 2022-09-13 NOTE — H&P (Signed)
Pre-Procedure H&P   Patient ID: Hannah Martinez is a 66 y.o. female.  Gastroenterology Provider: Annamaria Helling, DO  Referring Provider: Laurine Blazer, PA PCP: [facility] [place]  Date: 09/13/2022  HPI Ms. Hannah Martinez is a 66 y.o. female who presents today for Esophagogastroduodenoscopy and Colonoscopy for Dysphagia; screening colonoscopy.  Patient notes dysphagia to solids with upper esophageal sticking.  No odynophagia.  No issues with pills or liquids.  Weight and appetite have been stable.  She takes Prilosec 20 mg daily and does not have any current symptoms of reflux.  Patient reports constipation only having 1-2 bowel movements per week.  She denies melena or hematochezia.  No abdominal pain.  She does take Dulcolax to help improve the symptoms.  Remote history of colonoscopy No family history of colon cancer or colon polyps   Past Medical History:  Diagnosis Date   Cerebral aneurysm    Chronic pain    Degenerative joint disease    GERD (gastroesophageal reflux disease)    Hypercholesteremia    Myocardial infarction (HCC)    Stroke ([place])    TMJ (dislocation of temporomandibular joint)     Past Surgical History:  Procedure Laterality Date   CEREBRAL ANEURYSM REPAIR     RIGHT/LEFT HEART CATH AND CORONARY ANGIOGRAPHY N/A 02/25/2020   Procedure: RIGHT/LEFT HEART CATH AND CORONARY ANGIOGRAPHY;  Surgeon: Wellington Hampshire, MD;  Location: [facility] CV LAB;  Service: Cardiovascular;  Laterality: N/A;    Family History No h/o GI disease or malignancy  Review of Systems  Constitutional:  Negative for activity change, appetite change, chills, diaphoresis, fatigue, fever and unexpected weight change.  HENT:  Positive for trouble swallowing. Negative for voice change.   Respiratory:  Negative for shortness of breath and wheezing.   Cardiovascular:  Negative for chest pain, palpitations and leg swelling.  Gastrointestinal:  Positive for  constipation. Negative for abdominal distention, abdominal pain, anal bleeding, blood in stool, diarrhea, nausea, rectal pain and vomiting.  Musculoskeletal:  Negative for arthralgias and myalgias.  Skin:  Negative for color change and pallor.  Neurological:  Negative for dizziness, syncope and weakness.  Psychiatric/Behavioral:  Negative for confusion.   All other systems reviewed and are negative.    Medications No current facility-administered medications on file prior to encounter.   Current Outpatient Medications on File Prior to Encounter  Medication Sig Dispense Refill   empagliflozin (JARDIANCE) 10 MG TABS tablet Take 1 tablet (10 mg total) by mouth daily before breakfast. 90 tablet 3   ezetimibe (ZETIA) 10 MG tablet TAKE 1 TABLET BY MOUTH EVERY DAY 90 tablet 1   fluticasone (FLONASE) 50 MCG/ACT nasal spray Place 1-2 sprays into both nostrils daily as needed for allergies.     fluticasone-salmeterol (ADVAIR) 500-50 MCG/ACT AEPB Inhale 1 puff into the lungs in the morning and at bedtime.     furosemide (LASIX) 40 MG tablet TAKE 1 TABLET (40 MG TOTAL) BY MOUTH AS DIRECTED.AND EXTRA 1 TABLET AS NEEDED FOR SWELLING 180 tablet 3   gabapentin (NEURONTIN) 300 MG capsule Take 300 mg by mouth 3 (three) times daily. (take each capsule with 600mg  tablet to equal 900mg  three times a day)     gabapentin (NEURONTIN) 600 MG tablet Take 600 mg by mouth 3 (three) times daily. (take each tablet with 300mg  capsule to equal 900mg  three times a day)     omeprazole (PRILOSEC) 20 MG capsule TAKE 1 CAPSULE BY MOUTH EVERY DAY 90 capsule 1  sacubitril-valsartan (ENTRESTO) 24-26 MG Take 1 tablet by mouth 2 (two) times daily. 180 tablet 0   benzonatate (TESSALON) 100 MG capsule Take 100 mg by mouth 3 (three) times daily as needed for cough.     [facility] ASPIRIN ADULT LOW DOSE 81 MG chewable tablet CHEW 1 TABLET (81 MG TOTAL) BY MOUTH DAILY. 90 tablet 2   meloxicam (MOBIC) 7.5 MG tablet Take 7.5 mg by mouth daily.      rosuvastatin (CRESTOR) 40 MG tablet Take 1 tablet (40 mg total) by mouth daily. 90 tablet 3   tobramycin-dexamethasone (TOBRADEX) ophthalmic solution       Pertinent medications related to GI and procedure were reviewed by me with the patient prior to the procedure   Current Facility-Administered Medications:    0.9 %  sodium chloride infusion, , Intravenous, Continuous, Jaynie Collins, DO, Last Rate: 20 mL/hr at 09/13/22 0920, Continued from Pre-op at 09/13/22 0920      Allergies  Allergen Reactions   Lipitor [Atorvastatin] Hives   Allergies were reviewed by me prior to the procedure  Objective   Body mass index is 27.51 kg/m. Vitals:   09/13/22 0848  BP: 128/85  Pulse: 70  Resp: 18  Temp: (!) 96.7 F (35.9 C)  TempSrc: Temporal  SpO2: 100%  Weight: 75 kg  Height: 5\' 5"  (1.651 m)     Physical Exam Vitals and nursing note reviewed.  Constitutional:      General: She is not in acute distress.    Appearance: Normal appearance. She is obese. She is not ill-appearing, toxic-appearing or diaphoretic.  HENT:     Head: Normocephalic and atraumatic.     Nose: Nose normal.     Mouth/Throat:     Mouth: Mucous membranes are moist.     Pharynx: Oropharynx is clear.  Eyes:     General: No scleral icterus.    Extraocular Movements: Extraocular movements intact.  Cardiovascular:     Rate and Rhythm: Normal rate and regular rhythm.     Heart sounds: Normal heart sounds. No murmur heard.    No friction rub. No gallop.  Pulmonary:     Effort: Pulmonary effort is normal. No respiratory distress.     Breath sounds: Normal breath sounds. No wheezing, rhonchi or rales.  Abdominal:     General: Bowel sounds are normal. There is no distension.     Palpations: Abdomen is soft.     Tenderness: There is no abdominal tenderness. There is no guarding or rebound.  Musculoskeletal:     Cervical back: Neck supple.     Right lower leg: No edema.     Left lower leg: No edema.   Skin:    General: Skin is warm and dry.     Coloration: Skin is not jaundiced or pale.  Neurological:     Mental Status: She is alert and oriented to person, place, and time. Mental status is at baseline.  Psychiatric:        Mood and Affect: Mood normal.        Behavior: Behavior normal.        Thought Content: Thought content normal.        Judgment: Judgment normal.      Assessment:  Ms. Hannah Martinez is a 66 y.o. female  who presents today for Esophagogastroduodenoscopy and Colonoscopy for dysphagia; screening colonoscopy.  Plan:  Esophagogastroduodenoscopy and Colonoscopy with possible intervention today  Esophagogastroduodenoscopy and Colonoscopy with possible biopsy, control of bleeding, polypectomy, and  interventions as necessary has been discussed with the patient/patient representative. Informed consent was obtained from the patient/patient representative after explaining the indication, nature, and risks of the procedure including but not limited to death, bleeding, perforation, missed neoplasm/lesions, cardiorespiratory compromise, and reaction to medications. Opportunity for questions was given and appropriate answers were provided. Patient/patient representative has verbalized understanding is amenable to undergoing the procedure.   Annamaria Helling, DO  [facility]  Portions of the record may have been created with voice recognition software. Occasional wrong-word or 'sound-a-like' substitutions may have occurred due to the inherent limitations of voice recognition software.  Read the chart carefully and recognize, using context, where substitutions may have occurred.

## 2022-09-13 NOTE — Interval H&P Note (Signed)
History and Physical Interval Note: Preprocedure H&P from 09/13/22  was reviewed and there was no interval change after seeing and examining the patient.  Written consent was obtained from the patient after discussion of risks, benefits, and alternatives. Patient has consented to proceed with Esophagogastroduodenoscopy and Colonoscopy with possible intervention   09/13/2022 9:30 AM  Hannah Martinez  has presented today for surgery, with the diagnosis of Colon cancer screening (Z12.11) Gastroesophageal reflux disease, unspecified whether esophagitis present (K21.9) Dysphagia, unspecified type (R13.10).  The various methods of treatment have been discussed with the patient and family. After consideration of risks, benefits and other options for treatment, the patient has consented to  Procedure(s) with comments: COLONOSCOPY (N/A) - Patient given arrival of 8:30 AM due to transportation - per Tori ESOPHAGOGASTRODUODENOSCOPY (EGD) (N/A) as a surgical intervention.  The patient's history has been reviewed, patient examined, no change in status, stable for surgery.  I have reviewed the patient's chart and labs.  Questions were answered to the patient's satisfaction.     Annamaria Helling

## 2022-09-13 NOTE — Anesthesia Postprocedure Evaluation (Signed)
Anesthesia Post Note  Patient: Hannah Martinez  Procedure(s) Performed: COLONOSCOPY ESOPHAGOGASTRODUODENOSCOPY (EGD)  Patient location during evaluation: PACU Anesthesia Type: General Level of consciousness: oriented and awake and alert Pain management: pain level controlled Vital Signs Assessment: post-procedure vital signs reviewed and stable Respiratory status: nonlabored ventilation and respiratory function stable Cardiovascular status: stable Anesthetic complications: no   No notable events documented.   Last Vitals:  Vitals:   09/13/22 0848 09/13/22 1023  BP: 128/85 113/76  Pulse: 70 76  Resp: 18   Temp: (!) 35.9 C   SpO2: 100% 97%    Last Pain:  Vitals:   09/13/22 0848  TempSrc: Temporal                 VAN STAVEREN,Veria Stradley

## 2022-09-13 NOTE — Op Note (Addendum)
[facility] Gastroenterology Patient Name: Hannah Martinez Procedure Date: 09/13/2022 9:18 AM MRN: [id number] Account #: [id number] Date of Birth: October 19, 1956 Admit Type: Outpatient Age: 66 Room: [facility] ENDO ROOM 2 Gender: Female Note Status: Finalized Instrument Name: Colonscope [id number] Procedure:             Colonoscopy Indications:           Screening for colorectal malignant neoplasm Providers:             Annamaria Helling DO, DO Referring MD:          Cambell j. [facility], dr (Referring MD) Medicines:             Monitored Anesthesia Care Complications:         No immediate complications. Estimated blood loss:                         Minimal. Procedure:             Pre-Anesthesia Assessment:                        - Prior to the procedure, a History and Physical was                         performed, and patient medications and allergies were                         reviewed. The patient is competent. The risks and                         benefits of the procedure and the sedation options and                         risks were discussed with the patient. All questions                         were answered and informed consent was obtained.                         Patient identification and proposed procedure were                         verified by the physician, the nurse, the anesthetist                         and the technician in the endoscopy suite. Mental                         Status Examination: alert and oriented. Airway                         Examination: normal oropharyngeal airway and neck                         mobility. Respiratory Examination: clear to                         auscultation. CV Examination: regular rate and rhythm.  Prophylactic Antibiotics: The patient does not require                         prophylactic antibiotics. Prior Anticoagulants: The                         patient has taken no  previous anticoagulant or                         antiplatelet agents. ASA Grade Assessment: III - A                         patient with severe systemic disease. After reviewing                         the risks and benefits, the patient was deemed in                         satisfactory condition to undergo the procedure. The                         anesthesia plan was to use monitored anesthesia care                         (MAC). Immediately prior to administration of                         medications, the patient was re-assessed for adequacy                         to receive sedatives. The heart rate, respiratory                         rate, oxygen saturations, blood pressure, adequacy of                         pulmonary ventilation, and response to care were                         monitored throughout the procedure. The physical                         status of the patient was re-assessed after the                         procedure.                        After obtaining informed consent, the colonoscope was                         passed under direct vision. Throughout the procedure,                         the patient's blood pressure, pulse, and oxygen                         saturations were monitored continuously. The  Colonoscope was introduced through the anus and                         advanced to the the cecum, identified by appendiceal                         orifice and ileocecal valve. The colonoscopy was                         somewhat difficult due to a redundant colon.                         Successful completion of the procedure was aided by                         straightening and shortening the scope to obtain bowel                         loop reduction, using scope torsion, applying                         abdominal pressure and lavage. The patient tolerated                         the procedure well. The quality of the bowel                          preparation was evaluated using the BBPS [facility] Bowel                         Preparation Scale) with scores of: Right Colon = 2                         (minor amount of residual staining, small fragments of                         stool and/or opaque liquid, but mucosa seen well),                         Transverse Colon = 3 (entire mucosa seen well with no                         residual staining, small fragments of stool or opaque                         liquid) and Left Colon = 3 (entire mucosa seen well                         with no residual staining, small fragments of stool or                         opaque liquid). The total BBPS score equals 8. The                         quality of the bowel preparation was excellent. The  ileocecal valve, appendiceal orifice, and rectum were                         photographed. Findings:      The perianal and digital rectal examinations were normal. Pertinent       negatives include normal sphincter tone.      Non-bleeding internal hemorrhoids were found during retroflexion. The       hemorrhoids were Grade I (internal hemorrhoids that do not prolapse).       Estimated blood loss: none.      Multiple small-mouthed diverticula were found in the left colon.       Estimated blood loss: none.      Four sessile polyps were found in the descending colon, hepatic flexure       and ascending colon. The polyps were 1 to 3 mm in size. These polyps       were removed with a jumbo cold forceps. Resection and retrieval were       complete. Estimated blood loss was minimal. Multiple hyperplastic       appearing polyps appearing in the sigmoid colon and rectum which were       not removed.      The exam was otherwise without abnormality on direct and retroflexion       views. Impression:            - Non-bleeding internal hemorrhoids.                        - Diverticulosis in the left colon.                         - Four 1 to 3 mm polyps in the descending colon, at                         the hepatic flexure and in the ascending colon,                         removed with a jumbo cold forceps. Resected and                         retrieved.                        - The examination was otherwise normal on direct and                         retroflexion views. Recommendation:        - Patient has a contact number available for                         emergencies. The signs and symptoms of potential                         delayed complications were discussed with the patient.                         Return to normal activities tomorrow. Written                         discharge instructions were provided to  the patient.                        - Discharge patient to home.                        - Soft diet today.                        - Continue present medications.                        - No aspirin, ibuprofen, naproxen, or other                         non-steroidal anti-inflammatory drugs for 5 days after                         polyp removal.                        - Await pathology results.                        - Repeat colonoscopy for surveillance based on                         pathology results.                        - Return to referring physician as previously                         scheduled.                        - Recommend initiation of daily miralax to assist with                         bowel movements.                        - The findings and recommendations were discussed with                         the patient. Procedure Code(s):     --- Professional ---                        [phone], Colonoscopy, flexible; with biopsy, single or                         multiple Diagnosis Code(s):     --- Professional ---                        K64.0, First degree hemorrhoids                        K63.5, Polyp of colon                        K62.5, Hemorrhage of anus and rectum  K57.30, Diverticulosis of large intestine without                         perforation or abscess without bleeding CPT copyright 2019 American Medical Association. All rights reserved. The codes documented in this report are preliminary and upon coder review may  be revised to meet current compliance requirements. Attending Participation:      I personally performed the entire procedure. Volney American, DO Annamaria Helling DO, DO 09/13/2022 10:27:21 AM This report has been signed electronically. Number of Addenda: 0 Note Initiated On: 09/13/2022 9:18 AM Scope Withdrawal Time: 0 hours 19 minutes 53 seconds  Total Procedure Duration: 0 hours 29 minutes 4 seconds  Estimated Blood Loss:  Estimated blood loss was minimal.      [employer]

## 2022-09-13 NOTE — Op Note (Signed)
[facility]astroenterology Patient Name: Hannah Martinez Procedure Date: 09/13/2022 9:18 AM MRN: [id number] Account #: [id number] Date of Birth: January 13, 1956 Admit Type: Outpatient Age: 66 Room: [facility] ENDO ROOM 2 Gender: Female Note Status: Finalized Instrument Name: Altamese Cabal Endoscope [id number] Procedure:             Upper GI endoscopy Indications:           Dysphagia Providers:             Annamaria Helling DO, DO Referring MD:          Cambell j. [facility], dr (Referring MD) Medicines:             Monitored Anesthesia Care Complications:         No immediate complications. Estimated blood loss:                         Minimal. Procedure:             Pre-Anesthesia Assessment:                        - Prior to the procedure, a History and Physical was                         performed, and patient medications and allergies were                         reviewed. The patient is competent. The risks and                         benefits of the procedure and the sedation options and                         risks were discussed with the patient. All questions                         were answered and informed consent was obtained.                         Patient identification and proposed procedure were                         verified by the physician, the nurse, the anesthetist                         and the technician in the endoscopy suite. Mental                         Status Examination: alert and oriented. Airway                         Examination: normal oropharyngeal airway and neck                         mobility. Respiratory Examination: clear to                         auscultation. CV Examination: regular rate and rhythm.  Prophylactic Antibiotics: The patient does not require                         prophylactic antibiotics. Prior Anticoagulants: The                         patient has taken no previous anticoagulant or                          antiplatelet agents. ASA Grade Assessment: III - A                         patient with severe systemic disease. After reviewing                         the risks and benefits, the patient was deemed in                         satisfactory condition to undergo the procedure. The                         anesthesia plan was to use monitored anesthesia care                         (MAC). Immediately prior to administration of                         medications, the patient was re-assessed for adequacy                         to receive sedatives. The heart rate, respiratory                         rate, oxygen saturations, blood pressure, adequacy of                         pulmonary ventilation, and response to care were                         monitored throughout the procedure. The physical                         status of the patient was re-assessed after the                         procedure.                        After obtaining informed consent, the endoscope was                         passed under direct vision. Throughout the procedure,                         the patient's blood pressure, pulse, and oxygen                         saturations were monitored continuously. The Endoscope  was introduced through the mouth, and advanced to the                         second part of duodenum. The upper GI endoscopy was                         accomplished without difficulty. The patient tolerated                         the procedure well. Findings:      Localized mild inflammation characterized by erythema and granularity       was found in the second portion of the duodenum. Estimated blood loss:       none.      A 2 cm hiatal hernia was present. Estimated blood loss: none.      The entire examined stomach was normal. Biopsies were taken with a cold       forceps for Helicobacter pylori testing. Estimated blood loss was        minimal.      A widely patent Schatzki ring was found at the gastroesophageal       junction. This was biopsied with a cold forceps for breaking ring after       dilation was performed with minimal change. The scope was withdrawn.       Dilation was performed with a Maloney dilator with no resistance at 52       Fr. The dilation site was examined following endoscope reinsertion and       showed mild mucosal disruption. Estimated blood loss was minimal.      The Z-line was regular. Estimated blood loss: none.      The exam of the esophagus was otherwise normal. Impression:            - Duodenitis.                        - 2 cm hiatal hernia.                        - Normal stomach. Biopsied.                        - Widely patent Schatzki ring. Biopsied. Dilated.                        - Z-line regular. Recommendation:        - Patient has a contact number available for                         emergencies. The signs and symptoms of potential                         delayed complications were discussed with the patient.                         Return to normal activities tomorrow. Written                         discharge instructions were provided to the patient.                        -  Discharge patient to home.                        - Soft diet today.                        - Continue present medications.                        - No aspirin, ibuprofen, naproxen, or other                         non-steroidal anti-inflammatory drugs.                        - Continued acid suppressive therapy. consider                         increasing dose to 40 mg from 25m.                        - Await pathology results.                        - Return to GI clinic as previously scheduled.                        - proceed with colonoscopy                        - The findings and recommendations were discussed with                         the patient. Procedure Code(s):     --- Professional  ---                        [phone] Esophagogastroduodenoscopy, flexible,                         transoral; with biopsy, single or multiple                        43450, Dilation of esophagus, by unguided sound or                         bougie, single or multiple passes Diagnosis Code(s):     --- Professional ---                        K29.80, Duodenitis without bleeding                        K44.9, Diaphragmatic hernia without obstruction or                         gangrene                        K22.2, Esophageal obstruction                        R13.10, Dysphagia, unspecified CPT copyright 2019 American Medical Association. All rights reserved. The codes documented in this report  are preliminary and upon coder review may  be revised to meet current compliance requirements. Attending Participation:      I personally performed the entire procedure. Volney American, DO Annamaria Helling DO, DO 09/13/2022 9:48:08 AM This report has been signed electronically. Number of Addenda: 0 Note Initiated On: 09/13/2022 9:18 AM Estimated Blood Loss:  Estimated blood loss was minimal.      [facility]

## 2022-09-13 NOTE — Anesthesia Preprocedure Evaluation (Addendum)
Anesthesia Evaluation  Patient identified by MRN, date of birth, ID band Patient awake    Reviewed: Allergy & Precautions, NPO status , Patient's Chart, lab work & pertinent test results  Airway Mallampati: III  TM Distance: <3 FB Neck ROM: full    Dental  (+) Upper Dentures   Pulmonary neg pulmonary ROS, Patient abstained from smoking., former smoker,    Pulmonary exam normal  + decreased breath sounds      Cardiovascular Exercise Tolerance: Good hypertension, + CAD and + Past MI  negative cardio ROS Normal cardiovascular examIII Rhythm:Regular     Neuro/Psych HX of cerebral aneurysm x 2 CVA negative neurological ROS  negative psych ROS   GI/Hepatic negative GI ROS, Neg liver ROS, GERD  Medicated,  Endo/Other  negative endocrine ROS  Renal/GU negative Renal ROS  negative genitourinary   Musculoskeletal   Abdominal (+) + obese,   Peds negative pediatric ROS (+)  Hematology negative hematology ROS (+)   Anesthesia Other Findings Past Medical History: No date: Cerebral aneurysm No date: Chronic pain No date: Degenerative joint disease No date: GERD (gastroesophageal reflux disease) No date: Hypercholesteremia No date: Myocardial infarction ([place]) No date: Stroke ([place]) No date: TMJ (dislocation of temporomandibular joint)  Past Surgical History: No date: CEREBRAL ANEURYSM REPAIR 02/25/2020: RIGHT/LEFT HEART CATH AND CORONARY ANGIOGRAPHY; N/A     Comment:  Procedure: RIGHT/LEFT HEART CATH AND CORONARY               ANGIOGRAPHY;  Surgeon: Wellington Hampshire, MD;  Location:               [facility];  Service: Cardiovascular;                Laterality: N/A;  BMI    Body Mass Index: 27.51 kg/m      Reproductive/Obstetrics negative OB ROS                            Anesthesia Physical Anesthesia Plan  ASA: 3  Anesthesia Plan: General   Post-op Pain Management:     Induction: Intravenous  PONV Risk Score and Plan: Propofol infusion and TIVA  Airway Management Planned: Natural Airway  Additional Equipment:   Intra-op Plan:   Post-operative Plan:   Informed Consent: I have reviewed the patients History and Physical, chart, labs and discussed the procedure including the risks, benefits and alternatives for the proposed anesthesia with the patient or authorized representative who has indicated his/her understanding and acceptance.     Dental Advisory Given  Plan Discussed with: CRNA and Surgeon  Anesthesia Plan Comments:        Anesthesia Quick Evaluation

## 2022-09-14 ENCOUNTER — Encounter: Payer: Self-pay | Admitting: Gastroenterology

## 2022-09-14 ENCOUNTER — Telehealth: Payer: Self-pay | Admitting: Cardiovascular Disease

## 2022-09-14 NOTE — Telephone Encounter (Signed)
Patient called to follow-up on Jardiance medication management.  Patient gave new contact phone number - [phone]

## 2022-09-15 LAB — SURGICAL PATHOLOGY

## 2022-09-15 NOTE — Telephone Encounter (Signed)
Wrong number.

## 2022-09-15 NOTE — Telephone Encounter (Signed)
Spoke with patient and reviewed that I will follow up on her application. Advised that I would review application then call company to check on the status. She was very appreciative for the call back with no further questions at this time.

## 2022-09-16 NOTE — Telephone Encounter (Signed)
Left voicemail message to call back for review of her information.   Application sent last year in October 2022. At that time patient had medicaid and had no copay for this medication. Will discuss with patient to see if she is still on medicaid. If not then we can do another application.

## 2022-09-21 ENCOUNTER — Inpatient Hospital Stay: Admission: RE | Admit: 2022-09-21 | Payer: 59 | Source: Ambulatory Visit

## 2022-09-21 ENCOUNTER — Other Ambulatory Visit: Payer: 59

## 2022-09-22 NOTE — Telephone Encounter (Signed)
Left voicemail message to call back for review of information.  

## 2022-09-23 NOTE — Telephone Encounter (Signed)
Left voicemail message to call back so we can review information regarding her medication.

## 2022-09-24 ENCOUNTER — Encounter: Payer: Self-pay | Admitting: *Deleted

## 2022-09-24 NOTE — Telephone Encounter (Signed)
Left voicemail message that I was returning her call in reference to a patient assistance application and to give [place] a call back if we can assist with anything. Closing encounter.

## 2022-10-21 ENCOUNTER — Other Ambulatory Visit: Payer: Self-pay | Admitting: Cardiovascular Disease

## 2022-10-23 ENCOUNTER — Other Ambulatory Visit: Payer: Self-pay | Admitting: Cardiovascular Disease

## 2022-11-01 ENCOUNTER — Other Ambulatory Visit: Payer: Self-pay | Admitting: Cardiovascular Disease

## 2022-11-12 ENCOUNTER — Other Ambulatory Visit: Payer: Self-pay | Admitting: Cardiovascular Disease

## 2022-11-13 ENCOUNTER — Other Ambulatory Visit: Payer: Self-pay | Admitting: Cardiovascular Disease

## 2022-11-22 ENCOUNTER — Ambulatory Visit: Payer: Medicare Other | Admitting: Cardiovascular Disease

## 2022-11-29 ENCOUNTER — Other Ambulatory Visit: Payer: Self-pay | Admitting: Cardiovascular Disease

## 2022-11-29 DIAGNOSIS — I25118 Atherosclerotic heart disease of native coronary artery with other forms of angina pectoris: Secondary | ICD-10-CM

## 2022-11-29 DIAGNOSIS — I255 Ischemic cardiomyopathy: Secondary | ICD-10-CM

## 2022-11-29 DIAGNOSIS — I5042 Chronic combined systolic (congestive) and diastolic (congestive) heart failure: Secondary | ICD-10-CM

## 2022-12-15 ENCOUNTER — Other Ambulatory Visit: Payer: Self-pay | Admitting: Cardiovascular Disease

## 2023-01-09 NOTE — Progress Notes (Unsigned)
Cardiology Office Note  Date:  01/10/2023   ID:  Hannah Martinez, DOB 03/06/1956, MRN [id number]  PCP:  [facility]   Chief Complaint  Patient presents with   12 month follow up     "Doing well." Medications reviewed by the patient verbally.     HPI:  Hannah Martinez is a 67 y.o. female with a hx of  stroke, left arm and leg symptoms cerebral aneurysm s/p clipping,  chronic pain,  hyperlipidemia  Coronary artery disease, occluded proximal LAD 02/2020 Ejection fraction 25 to 30%, July 23, 2020, unchanged in 2022 presenting to the hospital February 2021 with chest pain, shortness of breath  Who presents today for follow-up of her coronary disease, cardiomyopathy  Last seen by myself in clinic October 2022  In follow-up today reports she is feeling well Denies significant abdominal swelling or leg swelling  Walks with a cane following prior stroke, residual weakness on left  Takes Lasix 40 daily Sometimes extra lasix once a week Reports that she has all of her medications including Entresto and Jardiance  Denies orthopnea or PND  Echo 09/2021 reviewed  1. Left ventricular ejection fraction, by estimation, is 25 to 30%. The  left ventricle has severely decreased function. The left ventricle  demonstrates regional wall motion abnormalities (see scoring  diagram/findings for description). Left ventricular  diastolic parameters are consistent with Grade I diastolic dysfunction  (impaired relaxation). There is akinesis of the left ventricular, entire  septal wall.   EKG personally reviewed by myself on todays visit Normal sinus rhythm with rate 73 bpm T wave abnormality precordial leads V1 through V6, no significant change from 2022  Past medical history reviewed Admitted 02/24/2020-03/25/2020 to [facility] after presenting with cough, wheezing, chest pain.   echo 02/25/20 showing LVEF 20-25%, LV mildly dilated, akinesis in the LAD territory, RV normal size and  function, mild-moderate MR.   left and right heart cath 02/25/20  chronic occluded osital LAD with right to left and left to left collaterals, mild disease (30%) to L Cx. RHC with severely elevated filling pressures - moderate pulmonary hypertension, ---->suspect LAD infarct 2 weeks prior to cath  unable to undergo cardiac MRI for cardiac viability due to metal in her head from cerebral artery aneurysm repair.   Echo 07/23/2020 Left ventricular ejection fraction, by estimation, is 25 to 30%.   PMH:   has a past medical history of Cerebral aneurysm, Chronic pain, Degenerative joint disease, GERD (gastroesophageal reflux disease), Hypercholesteremia, Myocardial infarction [facility]), Stroke (HCC), and TMJ (dislocation of temporomandibular joint).  PSH:    Past Surgical History:  Procedure Laterality Date   CEREBRAL ANEURYSM REPAIR     COLONOSCOPY N/A 09/13/2022   Procedure: COLONOSCOPY;  Surgeon: Jaynie Collins, DO;  Location: [facility] ENDOSCOPY;  Service: Gastroenterology;  Laterality: N/A;  Patient given arrival of 8:30 AM due to transportation - per Tori   ESOPHAGOGASTRODUODENOSCOPY N/A 09/13/2022   Procedure: ESOPHAGOGASTRODUODENOSCOPY (EGD);  Surgeon: Jaynie Collins, DO;  Location: [facility] ENDOSCOPY;  Service: Gastroenterology;  Laterality: N/A;   RIGHT/LEFT HEART CATH AND CORONARY ANGIOGRAPHY N/A 02/25/2020   Procedure: RIGHT/LEFT HEART CATH AND CORONARY ANGIOGRAPHY;  Surgeon: Iran Ouch, MD;  Location: [facility] INVASIVE CV LAB;  Service: Cardiovascular;  Laterality: N/A;    Current Outpatient Medications  Medication Sig Dispense Refill   benzonatate (TESSALON) 100 MG capsule Take 100 mg by mouth 3 (three) times daily as needed for cough.     [employer] ASPIRIN ADULT LOW DOSE 81  MG chewable tablet CHEW 1 TABLET (81 MG TOTAL) BY MOUTH DAILY. 90 tablet 2   empagliflozin (JARDIANCE) 10 MG TABS tablet Take 1 tablet (10 mg total) by mouth daily before breakfast. 90 tablet 3   ezetimibe (ZETIA)  10 MG tablet TAKE ONE TABLET BY MOUTH DAILY AT 9AM 30 tablet 0   fluticasone (FLONASE) 50 MCG/ACT nasal spray Place 1-2 sprays into both nostrils daily as needed for allergies.     fluticasone-salmeterol (ADVAIR) 500-50 MCG/ACT AEPB Inhale 1 puff into the lungs in the morning and at bedtime.     furosemide (LASIX) 40 MG tablet TAKE 1 TABLET (40 MG TOTAL) BY MOUTH AS DIRECTED.AND EXTRA 1 TABLET AS NEEDED FOR SWELLING 180 tablet 3   gabapentin (NEURONTIN) 300 MG capsule Take 300 mg by mouth 3 (three) times daily. (take each capsule with 600mg  tablet to equal 900mg  three times a day)     gabapentin (NEURONTIN) 600 MG tablet Take 600 mg by mouth 3 (three) times daily. (take each tablet with 300mg  capsule to equal 900mg  three times a day)     meloxicam (MOBIC) 7.5 MG tablet Take 7.5 mg by mouth daily.     metoprolol succinate (TOPROL-XL) 25 MG 24 hr tablet Take 1 tablet (25 mg total) by mouth daily. Office visit needed for further refill. 90 tablet 0   omeprazole (PRILOSEC) 20 MG capsule TAKE ONE CAPSULE BY MOUTH DAILY AT 9AM 30 capsule 0   rosuvastatin (CRESTOR) 40 MG tablet TAKE 1 TABLET BY MOUTH DAILY 30 tablet 0   sacubitril-valsartan (ENTRESTO) 24-26 MG Take 1 tablet by mouth 2 (two) times daily. 180 tablet 0   spironolactone (ALDACTONE) 25 MG tablet Take 0.5 tablets (12.5 mg total) by mouth daily. Office visit needed for further refill. 45 tablet 0   tobramycin-dexamethasone (TOBRADEX) ophthalmic solution      No current facility-administered medications for this visit.    Allergies:   Lipitor [atorvastatin]   Social History:  The patient  reports that she has never smoked. She has never used smokeless tobacco. She reports that she does not drink alcohol and does not use drugs.   Family History:   family history includes Breast cancer in her cousin.    Review of Systems: Review of Systems  Constitutional: Negative.   HENT: Negative.    Respiratory: Negative.    Cardiovascular: Negative.    Gastrointestinal: Negative.   Musculoskeletal: Negative.   Neurological: Negative.   Psychiatric/Behavioral: Negative.    All other systems reviewed and are negative.   PHYSICAL EXAM: VS:  BP 110/80 (BP Location: Right Arm, Patient Position: Sitting, Cuff Size: Normal)   Pulse 73   Ht 5\' 4"  (1.626 m)   Wt 174 lb 8 oz (79.2 kg)   SpO2 98%   BMI 29.95 kg/m  , BMI Body mass index is 29.95 kg/m. Constitutional:  oriented to person, place, and time. No distress.  HENT:  Head: Grossly normal Eyes:  no discharge. No scleral icterus.  Neck: No JVD, no carotid bruits  Cardiovascular: Regular rate and rhythm, no murmurs appreciated Pulmonary/Chest: Clear to auscultation bilaterally, no wheezes or rails Abdominal: Soft.  no distension.  no tenderness.  Musculoskeletal: Normal range of motion Neurological:  normal muscle tone. Coordination normal. No atrophy Skin: Skin warm and dry Psychiatric: normal affect, pleasant  Recent Labs: No results found for requested labs within last 365 days.    Lipid Panel Lab Results  Component Value Date   CHOL 178 06/17/2020  HDL 54 06/17/2020   LDLCALC 102 (H) 06/17/2020   TRIG 122 06/17/2020      Wt Readings from Last 3 Encounters:  01/10/23 174 lb 8 oz (79.2 kg)  09/13/22 165 lb 5.5 oz (75 kg)  10/19/21 184 lb 4 oz (83.6 kg)     ASSESSMENT AND PLAN:  Problem List Items Addressed This Visit       Cardiology Problems   Stroke [facility])   Other Visit Diagnoses     Coronary artery disease of native artery of native heart with stable angina pectoris ([place])    -  Primary   Chronic systolic heart failure ([place])       Ischemic cardiomyopathy       Hyperlipidemia LDL goal <70       Pulmonary hypertension, unspecified ([place])       Nonrheumatic mitral valve regurgitation         Cad with stable angina Currently with no symptoms of angina. No further workup at this time. Continue current medication regimen. Continue Crestor 40 daily,  continue Zetia 10 mg daily Lab work requested from primary care  Chronic systolic and diastolic CHF Continue entresto,  spironolactone, metoprolol succinate,Jardiance 10 mg daily,  Lasix 40 daily, sometimes with extra Lasix approximately once per week for ankle swelling ejection fraction 25 to 30%, from 2022 Repeat echocardiogram ordered now on cardiac medications  Ischemic cardiomyopathy No plan for intervention to LAD/CTO  Denies anginal symptoms, cholesterol at goal, non-smoker  Hyperlipidemia Crestor 40 daily with Zetia   Total encounter time more than 30 minutes  Greater than 50% was spent in counseling and coordination of care with the patient    Signed, Esmond Plants, M.D., Ph.D. [place], [place]

## 2023-01-10 ENCOUNTER — Ambulatory Visit: Payer: 59 | Attending: Cardiovascular Disease | Admitting: Cardiovascular Disease

## 2023-01-10 ENCOUNTER — Encounter: Payer: Self-pay | Admitting: Cardiovascular Disease

## 2023-01-10 VITALS — BP 110/80 | HR 73 | Ht 64.0 in | Wt 174.5 lb

## 2023-01-10 DIAGNOSIS — I5022 Chronic systolic (congestive) heart failure: Secondary | ICD-10-CM | POA: Diagnosis not present

## 2023-01-10 DIAGNOSIS — I25118 Atherosclerotic heart disease of native coronary artery with other forms of angina pectoris: Secondary | ICD-10-CM | POA: Diagnosis not present

## 2023-01-10 DIAGNOSIS — E785 Hyperlipidemia, unspecified: Secondary | ICD-10-CM | POA: Diagnosis not present

## 2023-01-10 DIAGNOSIS — I639 Cerebral infarction, unspecified: Secondary | ICD-10-CM

## 2023-01-10 DIAGNOSIS — I255 Ischemic cardiomyopathy: Secondary | ICD-10-CM

## 2023-01-10 DIAGNOSIS — I272 Pulmonary hypertension, unspecified: Secondary | ICD-10-CM

## 2023-01-10 DIAGNOSIS — I340 Nonrheumatic mitral (valve) insufficiency: Secondary | ICD-10-CM

## 2023-01-10 NOTE — Patient Instructions (Addendum)
Labs from [facility] from Jan 2024   Medication Instructions:  No changes  If you need a refill on your cardiac medications before your next appointment, please call your pharmacy.    Lab work: No new labs needed   Testing/Procedures: 1) Echocardiogram (cardiomyopathy): -Your physician has requested that you have an echocardiogram. Echocardiography is a painless test that uses sound waves to create images of your heart. It provides your doctor with information about the size and shape of your heart and how well your heart's chambers and valves are working. This procedure takes approximately one hour. There are no restrictions for this procedure. There is a possibility that an IV may need to be started during your test to inject an image enhancing agent. This is done to obtain more optimal pictures of your heart. Therefore we ask that you do at least drink some water prior to coming in to hydrate your veins.   Please do NOT wear cologne, perfume, aftershave, or lotions (deodorant is allowed). Please arrive 15 minutes prior to your appointment time.   Follow-Up: At [facility], you and your health needs are our priority.  As part of our continuing mission to provide you with exceptional heart care, we have created designated Provider Care Teams.  These Care Teams include your primary Cardiologist (physician) and Advanced Practice Providers (APPs -  Physician Assistants and Nurse Practitioners) who all work together to provide you with the care you need, when you need it.  You will need a follow up appointment in 6 months, APP ok  Providers on your designated Care Team:   Murray Hodgkins, NP Christell Faith, PA-C Cadence Kathlen Mody, [place]  COVID-19 Vaccine Information can be found at: [email or web address] For questions related to vaccine distribution or appointments, please email [email or web address] or call [phone].

## 2023-01-13 ENCOUNTER — Other Ambulatory Visit: Payer: Self-pay | Admitting: Cardiovascular Disease

## 2023-01-19 ENCOUNTER — Encounter: Payer: Self-pay | Admitting: *Deleted

## 2023-02-03 ENCOUNTER — Other Ambulatory Visit: Payer: Self-pay | Admitting: Cardiovascular Disease

## 2023-02-15 ENCOUNTER — Other Ambulatory Visit: Payer: Self-pay

## 2023-02-15 ENCOUNTER — Encounter: Payer: Self-pay | Admitting: Emergency Medicine

## 2023-02-15 ENCOUNTER — Emergency Department
Admission: EM | Admit: 2023-02-15 | Discharge: 2023-02-15 | Disposition: A | Discharge status: Home / Self Care | Payer: 59 | Attending: Emergency Medicine | Admitting: Emergency Medicine

## 2023-02-15 DIAGNOSIS — R220 Localized swelling, mass and lump, head: Secondary | ICD-10-CM | POA: Diagnosis present

## 2023-02-15 DIAGNOSIS — K047 Periapical abscess without sinus: Secondary | ICD-10-CM | POA: Insufficient documentation

## 2023-02-15 MED ORDER — MAGIC MOUTHWASH W/LIDOCAINE: 5.0000 mL | Freq: Four times a day (QID) | ORAL | 0 refills | Status: DC

## 2023-02-15 MED ORDER — AMOXICILLIN 875 MG PO TABS: 875.0000 mg | ORAL_TABLET | Freq: Two times a day (BID) | ORAL | 0 refills | Status: DC

## 2023-02-15 NOTE — ED Notes (Signed)
See triage note  Presents with some swelling to right jaw /gumline   States pain is also in right ear  Afebrile on arrival

## 2023-02-15 NOTE — ED Triage Notes (Addendum)
Pt comes with c.o swollen gum. Pt states she bite inside of gum and now its swollen. Pt states right ear pain also.  Pt states she put some peroxide in mouth and now it is swollen. Pt does have noticeable swelling to right side face and lip/gum area.

## 2023-02-15 NOTE — ED Provider Notes (Signed)
[facility] Provider Note  Patient Contact: 12:05 PM (approximate)   History   Oral Swelling   HPI  Hannah Martinez is a 67 y.o. female who presents the emergency department complaining of swelling to the right cheek.  Patient states that she has bitten her cheek twice while eating.  She does not believe there was any edema or pain prior to this but no every time she tries to chew she is making contact with the cheek.  Patient has no fevers, chills, difficulty breathing or swallowing.     Physical Exam   Triage Vital Signs: ED Triage Vitals  Enc Vitals Group     BP 02/15/23 1040 (!) 142/91     Pulse Rate 02/15/23 1040 83     Resp 02/15/23 1040 18     Temp 02/15/23 1040 98 F (36.7 C)     Temp src --      SpO2 02/15/23 1040 100 %     Weight 02/15/23 1049 174 lb 9.7 oz (79.2 kg)     Height 02/15/23 1049 5' 4"$  (1.626 m)     Head Circumference --      Peak Flow --      Pain Score 02/15/23 1038 7     Pain Loc --      Pain Edu? --      Excl. in [place]? --     Most recent vital signs: Vitals:   02/15/23 1040  BP: (!) 142/91  Pulse: 83  Resp: 18  Temp: 98 F (36.7 C)  SpO2: 100%     General: Alert and in no acute distress. Head: No acute traumatic findings ENT:      Ears:       Nose: No congestion/rhinnorhea.      Mouth/Throat: Mucous membranes are moist.  Edema, erythema noted along the right mandible.  There is extension into the right submandibular region.  No palpable findings concerning for abscess.  Patient does have some tenderness in the area.  Patient also has what appears to be trauma to the inside of the cheek consistent with biting it.  No open wounds. Neck: No stridor. No cervical spine tenderness to palpation  Cardiovascular:  Good peripheral perfusion Respiratory: Normal respiratory effort without tachypnea or retractions. Lungs CTAB.  Musculoskeletal: Full range of motion to all extremities.  Neurologic:  No gross focal  neurologic deficits are appreciated.  Skin:   No rash noted Other:   ED Results / Procedures / Treatments   Labs (all labs ordered are listed, but only abnormal results are displayed) Labs Reviewed - No data to display   EKG     RADIOLOGY  I  No results found.  PROCEDURES:  Critical Care performed: No  Procedures   MEDICATIONS ORDERED IN ED: Medications - No data to display   IMPRESSION / MDM / [place] / ED COURSE  I reviewed the triage vital signs and the nursing notes.                                 Differential diagnosis includes, but is not limited to, dental infection, cheek trauma, broken dentition, sialoadenitis  Patient's presentation is most consistent with acute presentation with potential threat to life or bodily function.   Patient's diagnosis is consistent with infection, hematoma cheek.  Patient presents emergency department after biting the inside of her cheek.  States that this occurred  yesterday.  However given physical exam and concern the patient may have had a component of dental infection which caused some edema in the cheek causing her to bite the cheek.  As such I will place the patient on antibiotics as well as some controlled medication Magic mouthwash.  Concerning signs and symptoms and return precautions discussed with the patient.  No indication for labs or imaging currently..  Patient is given ED precautions to return to the ED for any worsening or new symptoms.     FINAL CLINICAL IMPRESSION(S) / ED DIAGNOSES   Final diagnoses:  Dental infection     Rx / DC Orders   ED Discharge Orders          Ordered    amoxicillin (AMOXIL) 875 MG tablet  2 times daily        02/15/23 1215    magic mouthwash w/lidocaine SOLN  4 times daily       Note to Pharmacy: Dispense in a 1/1/1 ratio. Use lidocaine, diphenhydramine, prednisolone   02/15/23 1215             Note:  This document was prepared using Dragon voice  recognition software and may include unintentional dictation errors.   Darletta Moll, PA-C 02/15/23 1217    Naaman Plummer, MD 02/15/23 [phone]

## 2023-02-16 ENCOUNTER — Ambulatory Visit: Payer: Self-pay

## 2023-02-16 NOTE — Telephone Encounter (Signed)
     Chief Complaint: Bottom lip swelling, seen in ED 02/15/23. Waiting fir someone to pick up prescriptions. Symptoms: Bottom lip swelling Frequency: Yesterday Pertinent Negatives: Patient denies any difficulty swallowing or breathing Disposition: []$ ED /[]$ Urgent Care (no appt availability in office) / []$ Appointment(In office/virtual)/ []$  [place] [facility]/ []$ Home Care/ []$ Refused Recommended Disposition /[]$ [place] [facility]/ []$  Follow-up with PCP Additional Notes: Call 911 for worsening of symptoms.  Reason for Disposition  [1] Mild face swelling (puffiness) AND [2] persists > 3 days  Answer Assessment - Initial Assessment Questions 1. ONSET: "When did the swelling start?" (e.g., minutes, hours, days)     Yesterday 2. LOCATION: "What part of the face is swollen?"     Bottom lip 3. SEVERITY: "How swollen is it?"     Moderate, no change 4. ITCHING: "Is there any itching?" If Yes, ask: "How much?"   (Scale 1-10; mild, moderate or severe)     No 5. PAIN: "Is the swelling painful to touch?" If Yes, ask: "How painful is it?"   (Scale 1-10; mild, moderate or severe)   - NONE (0): no pain   - MILD (1-3): doesn't interfere with normal activities    - MODERATE (4-7): interferes with normal activities or awakens from sleep    - SEVERE (8-10): excruciating pain, unable to do any normal activities      No 6. FEVER: "Do you have a fever?" If Yes, ask: "What is it, how was it measured, and when did it start?"      No 7. CAUSE: "What do you think is causing the face swelling?"     Infection 8. RECURRENT SYMPTOM: "Have you had face swelling before?" If Yes, ask: "When was the last time?" "What happened that time?"     No 9. OTHER SYMPTOMS: "Do you have any other symptoms?" (e.g., toothache, leg swelling)     Tooth 10. PREGNANCY: "Is there any chance you are pregnant?" "When was your last menstrual period?"       No  Protocols used: Face Swelling-A-AH

## 2023-02-17 ENCOUNTER — Ambulatory Visit: Payer: 59

## 2023-02-24 ENCOUNTER — Other Ambulatory Visit: Payer: Self-pay | Admitting: Cardiovascular Disease

## 2023-02-24 DIAGNOSIS — I5042 Chronic combined systolic (congestive) and diastolic (congestive) heart failure: Secondary | ICD-10-CM

## 2023-02-24 DIAGNOSIS — I255 Ischemic cardiomyopathy: Secondary | ICD-10-CM

## 2023-02-24 DIAGNOSIS — I25118 Atherosclerotic heart disease of native coronary artery with other forms of angina pectoris: Secondary | ICD-10-CM

## 2023-03-16 ENCOUNTER — Ambulatory Visit: Payer: 59 | Attending: Cardiovascular Disease

## 2023-03-16 DIAGNOSIS — I255 Ischemic cardiomyopathy: Secondary | ICD-10-CM | POA: Diagnosis not present

## 2023-03-16 DIAGNOSIS — I5022 Chronic systolic (congestive) heart failure: Secondary | ICD-10-CM

## 2023-03-16 LAB — ECHOCARDIOGRAM COMPLETE
AR max vel: 3.73 cm2
AV Area VTI: 3.31 cm2
AV Area mean vel: 3.72 cm2
AV Mean grad: 1 mmHg
AV Peak grad: 2.6 mmHg
Ao pk vel: 0.81 m/s
Area-P 1/2: 3.66 cm2
Calc EF: 34.3 %
S' Lateral: 4.1 cm
Single Plane A2C EF: 35.7 %
Single Plane A4C EF: 32.7 %

## 2023-03-23 ENCOUNTER — Encounter: Payer: Self-pay | Admitting: *Deleted

## 2023-03-24 ENCOUNTER — Telehealth: Payer: Self-pay | Admitting: Cardiovascular Disease

## 2023-03-24 NOTE — Telephone Encounter (Signed)
Reviewed results and recommendations with patient. She verbalized understanding with no further questions at this time.    Minna Merritts, MD 03/20/2023  4:02 PM EDT     Echo Severely depressed ejection fraction estimated 25 to 30%  hypokinesis in the anterior/septal wall concerning consistent with old heart attack March 2021 Appears relatively unchanged from echo July 2021 Would continue current medications to ensure cardiac function does not go any worse

## 2023-03-24 NOTE — Telephone Encounter (Signed)
No answer/No voicemail box has been set up. 

## 2023-03-24 NOTE — Telephone Encounter (Signed)
Patient called for her echo results.  °

## 2023-04-01 ENCOUNTER — Telehealth: Payer: Self-pay | Admitting: Cardiovascular Disease

## 2023-04-01 NOTE — Telephone Encounter (Signed)
Tried calling patient to inform her of ECHO results. Number is out of service. Letter with results mailed to patient 03/23/23

## 2023-04-01 NOTE — Telephone Encounter (Signed)
Pt calling to go over echo results, she states this is the only number she has [phone]

## 2023-06-11 ENCOUNTER — Other Ambulatory Visit: Payer: Self-pay

## 2023-06-11 ENCOUNTER — Emergency Department
Admission: EM | Admit: 2023-06-11 | Discharge: 2023-06-11 | Disposition: A | Discharge status: Home / Self Care | Payer: 59 | Attending: Emergency Medicine | Admitting: Emergency Medicine

## 2023-06-11 ENCOUNTER — Emergency Department: Payer: 59

## 2023-06-11 DIAGNOSIS — I509 Heart failure, unspecified: Secondary | ICD-10-CM | POA: Diagnosis not present

## 2023-06-11 DIAGNOSIS — R109 Unspecified abdominal pain: Secondary | ICD-10-CM | POA: Diagnosis not present

## 2023-06-11 DIAGNOSIS — K5900 Constipation, unspecified: Secondary | ICD-10-CM | POA: Diagnosis not present

## 2023-06-11 LAB — URINALYSIS, ROUTINE W REFLEX MICROSCOPIC
Bilirubin Urine: NEGATIVE
Glucose, UA: NEGATIVE mg/dL
Hgb urine dipstick: NEGATIVE
Ketones, ur: NEGATIVE mg/dL
Leukocytes,Ua: NEGATIVE
Nitrite: NEGATIVE
Protein, ur: NEGATIVE mg/dL
Specific Gravity, Urine: 1.017 (ref 1.005–1.030)
pH: 6 (ref 5.0–8.0)

## 2023-06-11 LAB — COMPREHENSIVE METABOLIC PANEL
ALT: 17 U/L (ref 0–44)
AST: 17 U/L (ref 15–41)
Albumin: 4.5 g/dL (ref 3.5–5.0)
Alkaline Phosphatase: 88 U/L (ref 38–126)
Anion gap: 11 (ref 5–15)
BUN: 10 mg/dL (ref 8–23)
CO2: 20 mmol/L — ABNORMAL LOW (ref 22–32)
Calcium: 9.4 mg/dL (ref 8.9–10.3)
Chloride: 106 mmol/L (ref 98–111)
Creatinine, Ser: 0.89 mg/dL (ref 0.44–1.00)
GFR, Estimated: >60 mL/min (ref >60)
Glucose, Bld: 111 mg/dL — ABNORMAL HIGH (ref 70–99)
Potassium: 3.4 mmol/L — ABNORMAL LOW (ref 3.5–5.1)
Sodium: 137 mmol/L (ref 135–145)
Total Bilirubin: 1.2 mg/dL (ref 0.3–1.2)
Total Protein: 8.2 g/dL — ABNORMAL HIGH (ref 6.5–8.1)

## 2023-06-11 LAB — CBC WITH DIFFERENTIAL/PLATELET
Abs Immature Granulocytes: 0.01 10*3/uL (ref 0.00–0.07)
Basophils Absolute: 0 10*3/uL (ref 0.0–0.1)
Basophils Relative: 0 %
Eosinophils Absolute: 0 10*3/uL (ref 0.0–0.5)
Eosinophils Relative: 0 %
HCT: 44.1 % (ref 36.0–46.0)
Hemoglobin: 14.6 g/dL (ref 12.0–15.0)
Immature Granulocytes: 0 %
Lymphocytes Relative: 22 %
Lymphs Abs: 1 10*3/uL (ref 0.7–4.0)
MCH: 32.4 pg (ref 26.0–34.0)
MCHC: 33.1 g/dL (ref 30.0–36.0)
MCV: 97.8 fL (ref 80.0–100.0)
Monocytes Absolute: 0.4 10*3/uL (ref 0.1–1.0)
Monocytes Relative: 7 %
Neutro Abs: 3.4 10*3/uL (ref 1.7–7.7)
Neutrophils Relative %: 71 %
Platelets: 212 10*3/uL (ref 150–400)
RBC: 4.51 MIL/uL (ref 3.87–5.11)
RDW: 12.4 % (ref 11.5–15.5)
WBC: 4.8 10*3/uL (ref 4.0–10.5)
nRBC: 0 % (ref 0.0–0.2)

## 2023-06-11 LAB — LIPASE, BLOOD: Lipase: 28 U/L (ref 11–51)

## 2023-06-11 MED ORDER — IOHEXOL 300 MG/ML  SOLN
100.0000 mL | Freq: Once | INTRAMUSCULAR | Status: AC | PRN
  Administered 2023-06-11: 100 mL via INTRAVENOUS

## 2023-06-11 MED ORDER — MORPHINE SULFATE (PF) 4 MG/ML IV SOLN
4.0000 mg | Freq: Once | INTRAVENOUS | Status: AC
  Administered 2023-06-11: 4 mg via INTRAVENOUS
  Filled 2023-06-11: qty 1

## 2023-06-11 MED ORDER — MAGNESIUM CITRATE PO SOLN
1.0000 | Freq: Once | ORAL | Status: DC
  Filled 2023-06-11: qty 296

## 2023-06-11 MED ORDER — ONDANSETRON HCL 4 MG/2ML IJ SOLN
4.0000 mg | Freq: Once | INTRAMUSCULAR | Status: AC
  Administered 2023-06-11: 4 mg via INTRAVENOUS
  Filled 2023-06-11: qty 2

## 2023-06-11 NOTE — ED Provider Notes (Addendum)
[facility] Provider Note    Event Date/Time   First MD Initiated Contact with Patient 06/11/23 [phone]     (approximate)   History   Constipation   HPI  Hannah Martinez is a 67 y.o. female with history of cerebral aneurysm, hypercholesterolemia, NSTEMI, heart failure presents emergency department complaining of abdominal pain.  Patient states she has not had a bowel movement in 1 week.  States that is not highly abnormal for her.  However she feels like she is very bloated and states her stomach was distended more yesterday than it is today.  Tried an enema without any relief.  States hurts very much along the epigastric and mid abdomen.  No fever or chills.  Some nausea but no vomiting.  No diarrhea.  No history of AAA      Physical Exam   Triage Vital Signs: ED Triage Vitals  Enc Vitals Group     BP 06/11/23 0724 (!) 137/93     Pulse Rate 06/11/23 0724 97     Resp 06/11/23 0724 20     Temp 06/11/23 0724 98.2 F (36.8 C)     Temp Source 06/11/23 0724 Oral     SpO2 06/11/23 0724 100 %     Weight 06/11/23 0725 155 lb (70.3 kg)     Height 06/11/23 0725 5\' 5"  (1.651 m)     Head Circumference --      Peak Flow --      Pain Score 06/11/23 0725 10     Pain Loc --      Pain Edu? --      Excl. in [place]? --     Most recent vital signs: Vitals:   06/11/23 0724  BP: (!) 137/93  Pulse: 97  Resp: 20  Temp: 98.2 F (36.8 C)  SpO2: 100%     General: Awake, no distress.   CV:  Good peripheral perfusion. regular rate and  rhythm Resp:  Normal effort. Lungs cta Abd:  Mildly distended, very tender in the midepigastric upper quadrants bilaterally, bowel sounds increased in the upper quadrant, decreased in the lower quadrants Other:      ED Results / Procedures / Treatments   Labs (all labs ordered are listed, but only abnormal results are displayed) Labs Reviewed  COMPREHENSIVE METABOLIC PANEL - Abnormal; Notable for the following components:       Result Value   Potassium 3.4 (*)    CO2 20 (*)    Glucose, Bld 111 (*)    Total Protein 8.2 (*)    All other components within normal limits  URINALYSIS, ROUTINE W REFLEX MICROSCOPIC - Abnormal; Notable for the following components:   Color, Urine STRAW (*)    APPearance CLEAR (*)    All other components within normal limits  LIPASE, BLOOD  CBC WITH DIFFERENTIAL/PLATELET     EKG     RADIOLOGY X-ray of the abdomen 1 view CT abdomen/pelvis with IV contrast    PROCEDURES:   Procedures   MEDICATIONS ORDERED IN ED: Medications  morphine (PF) 4 MG/ML injection 4 mg (4 mg Intravenous Given 06/11/23 0836)  ondansetron (ZOFRAN) injection 4 mg (4 mg Intravenous Given 06/11/23 0837)  iohexol (OMNIPAQUE) 300 MG/ML solution 100 mL (100 mLs Intravenous Contrast Given 06/11/23 0932)     IMPRESSION / MDM / ASSESSMENT AND PLAN / ED COURSE  I reviewed the triage vital signs and the nursing notes.  Differential diagnosis includes, but is not limited to, bowel obstruction, acute cholecystitis, constipation, acute appendicitis, hernia, AAA  Patient's presentation is most consistent with acute presentation with potential threat to life or bodily function.   The patient's physical exam and complaints do not correlate with AAA.  She does not have any pain in the abdomen that is radiating towards the back.  Blood pressure stable.  Symptoms have been ongoing for more than 1 to 2 days.  Therefore think this is less likely.  Will start with abdominal pain protocols.  Labs and imaging ordered  X-ray of the abdomen 1 view does show pronounced gas in the right upper quadrant, no stool ball is noted, this was independently reviewed and interpreted by me.  Radiologist comments mild gaseous distention of the proximal colon  Will do CT abdomen pelvis due to the amount of tenderness in the gaseous distention   Patient's labs are reassuring  CT abdomen pelvis with IV  contrast independently reviewed interpreted by me as being negative for any acute abnormality.  Confirmed by radiologist  Patient does state that she had pain relief with the medication and is feeling much better.  I did explain the findings to the patient.  Do not feel that her hernia is entrapped.  She can follow-up with surgery to have this repaired if she desires.  Do not see a bowel obstruction so she can continue to try and use laxatives.  I was going to send mag citrate home with her but she said she could just buy it over-the-counter.  She is in agreement treatment plan.  Do not feel she needs further workup at this time.  She was discharged in stable condition.   FINAL CLINICAL IMPRESSION(S) / ED DIAGNOSES   Final diagnoses:  Acute abdominal pain  Constipation, unspecified constipation type     Rx / DC Orders   ED Discharge Orders     None        Note:  This document was prepared using Dragon voice recognition software and may include unintentional dictation errors.    Faythe Ghee, PA-C 06/11/23 1147    Sherrie Mustache Roselyn Bering, PA-C 06/11/23 1147    Corena Herter, MD 06/11/23 1534

## 2023-06-11 NOTE — Discharge Instructions (Signed)
Use the mag citrate when you go home.  You can drink the whole bottle and this should make you have a bowel movement.  Gas-X for the gas bubble.  Return emergency department if worsening.  See regular doctor as needed.

## 2023-06-11 NOTE — ED Triage Notes (Signed)
Patient to ED from EMS complaining of constipation and nausea for one week; no relief with an Enema yesterday.   128/98 100 94%

## 2023-07-07 ENCOUNTER — Telehealth: Payer: Self-pay | Admitting: Cardiovascular Disease

## 2023-07-07 ENCOUNTER — Other Ambulatory Visit: Payer: Self-pay

## 2023-07-07 MED ORDER — ENTRESTO 24-26 MG PO TABS: 1.0000 | ORAL_TABLET | Freq: Two times a day (BID) | ORAL | 0 refills | Status: DC

## 2023-07-07 NOTE — Telephone Encounter (Signed)
Patient returned called to follow up on medication refill. Patient's 6 month follow up is scheduled for 08/18/23

## 2023-07-07 NOTE — Telephone Encounter (Signed)
Attempted to reach patient to inform her that a refill was sent in to her pharmacy of choice but could not leave a message

## 2023-07-07 NOTE — Telephone Encounter (Signed)
*  STAT* If patient is at the pharmacy, call can be transferred to refill team.   1. Which medications need to be refilled? (please list name of each medication and dose if known) sacubitril-valsartan (ENTRESTO) 24-26 MG   Take 1 tablet by mouth 2 (two) times daily.   2. Which pharmacy/location (including street and city if local pharmacy) is medication to be sent to?[id number] [place], [place] - [address]   3. Do they need a 30 day or 90 day supply? 90 Day Supply  Pt is currently out of medication

## 2023-07-07 NOTE — Telephone Encounter (Signed)
Please schedule 6 month F/U appointment. Thank you! 

## 2023-07-07 NOTE — Progress Notes (Signed)
Refill ordered  Patient returned called to follow up on medication refill.    Patient's 6 month follow up is scheduled for 08/18/23   Please schedule 6 month F/U appointment. Thank you!    *STAT* If patient is at the pharmacy, call can be transferred to refill team.     1. Which medications need to be refilled? (please list name of each medication and dose if known) sacubitril-valsartan (ENTRESTO) 24-26 MG    Take 1 tablet by mouth 2 (two) times daily.   2. Which pharmacy/location (including street and city if local pharmacy) is medication to be sent to?[id number] - [place],  - [address]    3. Do they need a 30 day or 90 day supply? 90 Day Supply   Pt is currently out of medication

## 2023-07-13 ENCOUNTER — Other Ambulatory Visit: Payer: Self-pay | Admitting: Cardiovascular Disease

## 2023-07-13 ENCOUNTER — Telehealth: Payer: Self-pay | Admitting: Cardiovascular Disease

## 2023-07-13 NOTE — Telephone Encounter (Signed)
Called and spoke with the pharmacy staff to inquire about the prescription that was sent on 07/07/2023.  The pharmacy informed me that the prescription is ready for pick up and the patient is aware.

## 2023-07-13 NOTE — Telephone Encounter (Signed)
*  STAT* If patient is at the pharmacy, call can be transferred to refill team.   1. Which medications need to be refilled? (please list name of each medication and dose if known)   sacubitril-valsartan (ENTRESTO) 24-26 MG    2. Would you like to learn more about the convenience, safety, & potential cost savings by using the [facility]?    3. Are you open to using the [facility] (Type [facility]. ).   4. Which pharmacy/location (including street and city if local pharmacy) is medication to be sent to?  [id number] - [place], [place] - [address]   5. Do they need a 30 day or 90 day supply?   Patient stated she is now out of this [facility] and [facility]ill request.  Patient wants prescription re-sent to pharmacy.

## 2023-08-11 ENCOUNTER — Other Ambulatory Visit: Payer: Self-pay | Admitting: Cardiovascular Disease

## 2023-08-16 NOTE — Progress Notes (Unsigned)
Cardiology Office Note:  .   Date:  08/18/2023  ID:  Hannah Martinez, DOB 1956/03/30, MRN [id number] PCP: [facility]  [facility] Providers Cardiologist:  Julien Nordmann, MD    History of Present Illness: Hannah Martinez is a 67 y.o. female with past medical history of HFrEF, CAD with chronic occluded ostial LAD with collaterals, ICM, CVA, HLD, GERD, cerebral aneurysm s/p clipping. She is followed by Dr. Mariah Milling, she presents today for follow up.   In 1999 she underwent cerebral aneurysm repair at [facility].  In 2003 she had progressive left hemiparesis and was found to have a large right middle cerebral artery aneurysm proximal aneurysmal clip with reoperation at [facility] in 04/2002.  In 09/2012 she had a lipoma removed from her right frontal bone that was complicated by a MRSA infection.  She was admitted into/20[facility] to [facility] after presenting with cough, wheezing and chest pain.  She underwent echo in 02/2020 that showed LVEF 20 to 25%, LV mildly dilated, akinesis in the LAD territory, RV normal size and function, mild-moderate MR.  She underwent right and left heart cath showing chronic occluded ostial LAD with right to left and left to left collaterals, mild disease (30%) to LCx.  RHC with severely elevated filling pressures, moderate pulmonary hypertension, mildly reduced cardiac output.  Per Dr. Jari Sportsman cath report suspect LAD infarct 2 weeks prior to cath and question whether or not the anterior wall was viable.  Her cough and shortness of breath were attributed to severe volume overload.  She underwent diuresis and was started on GDMT.   She was last seen in office by Dr. Mariah Milling on 12/2022, reported doing well overall. It was recommended she undergo repeat echocardiogram.  Her echocardiogram on 03/16/2023 indicated LVEF of 25 to 30%, LV with severely decreased function and RWMA, G1 DD, there is akinesis of the left ventricular, entire anterior wall and anteroseptal wall.   Mitral valve regurgitation was trivial.  Echo was reviewed by Dr. Mariah Milling, he noted that it was relatively unchanged from prior echo in July 2021, he recommended continuing current medications.  She presented to the ED on 06/11/23 for constipation and nausea. No bowel obstruction noted, she was noted to have  small epigastric ventral hernia containing only omental fat. She was discharged with recommendation for general surgery follow up. General surgery felt that her hernia was not cause for her constipation, no need for surgery unless patient wanted to proceed. She has decided to not pursue surgery.   Today she presents for follow up. She reports that she has been doing well. She notes an episode of chest pain a few weeks ago that she attributes to increased anxiety related to her constipation and hernia. Resolved following bowel movement with no further occurrence. She does endorse some dyspnea on exertion that has worsened in the last few weeks, notes occasional mild lower extremity edema, she has not taken any additional doses of lasix. Denies orthopnea, pnd or abdominal swelling. Reports that she has been steadily gaining weight since January but feels it is likely related to her poor diet, endorses high sugar consumption and drinking 12 cans of soda a day.   ROS: Today she denies fatigue, palpitations, melena, hematuria, hemoptysis, diaphoresis, weakness, presyncope, syncope, orthopnea, and PND.   Studies Reviewed: Marland Kitchen   EKG Interpretation Date/Time:  Thursday August 18 2023 07:55:18 EDT Ventricular Rate:  63 PR Interval:  152 QRS Duration:  78 QT Interval:  440 QTC Calculation:  450 R Axis:   -8  Text Interpretation: Sinus rhythm with occasional Premature ventricular complexes Minimal voltage criteria for LVH, may be normal variant ( R in aVL ) Septal infarct (cited on or before 24-Feb-2020) When compared with ECG of 25-Feb-2020 05:38, Premature ventricular complexes are now Present Vent. rate  has decreased BY  39 BPM T wave inversion less evident in Anterior leads Confirmed by Reather Littler [id number]) on 08/18/2023 8:04:34 AM   Cardiac Studies & Procedures   CARDIAC CATHETERIZATION  CARDIAC CATHETERIZATION 02/25/2020  Narrative  Ost LAD to Prox LAD lesion is 100% stenosed.  Mid Cx lesion is 30% stenosed.  3rd Mrg lesion is 30% stenosed.  1.  Left dominant coronary arteries with chronically occluded ostial LAD with some right to left and left to left collaterals.  Mild disease affecting the left circumflex. 2.  Right heart catheterization showed severely elevated filling pressures with RA pressure of 10 mmHg, pulmonary capillary wedge pressure of 31 mmHg with prominent V wave suggestive of mitral regurgitation, moderate pulmonary hypertension at 55/31 mmHg and mildly reduced cardiac output at 4.56 with a cardiac index of 2.47.  LVEDP was 34 mmHg.  Recommendations: The patient likely had an LAD infarct 2 weeks ago.  I suspect that the anterior wall is now nonviable.  Unfortunately, she is left with severe ischemic cardiomyopathy and right heart catheterization shows evidence of severe volume overload likely responsible for her symptoms of shortness of breath and dry cough. I am going to start intravenous diuresis with furosemide 40 mg twice daily.  I added small dose carvedilol and losartan.  Recommend small dose spironolactone before hospital discharge and consider switching losartan to [facility] as an outpatient. Recommend aggressive medical therapy for coronary artery disease.  Findings Coronary Findings Diagnostic  Dominance: Left  Left Main The vessel exhibits minimal luminal irregularities.  Left Anterior Descending Collaterals Dist LAD filled by collaterals from Dist RCA.  Collaterals Dist LAD filled by collaterals from 1st LPL.  Ost LAD to Prox LAD lesion is 100% stenosed.  Ramus Intermedius There is mild diffuse disease throughout the vessel.  Left Circumflex Mid  Cx lesion is 30% stenosed.  Third Obtuse Marginal Branch 3rd Mrg lesion is 30% stenosed.  Right Coronary Artery The vessel exhibits minimal luminal irregularities.  Intervention  No interventions have been documented.     ECHOCARDIOGRAM  ECHOCARDIOGRAM COMPLETE 03/16/2023  Narrative ECHOCARDIOGRAM REPORT    Patient Name:   Hannah Martinez Date of Exam: 03/16/2023 Medical Rec #:  [id number]       Height:       64.0 in Accession #:    [id number]      Weight:       174.6 lb Date of Birth:  1956/02/04       BSA:          1.847 m Patient Age:    66 years        BP:           110/80 mmHg Patient Gender: F               HR:           56 bpm. Exam Location:  [place]  Procedure: 2D Echo, Cardiac Doppler and Color Doppler  Indications:    I50.20* Unspecified systolic (congestive) heart failure  History:        Patient has prior history of Echocardiogram examinations. CHF, Previous Myocardial Infarction and CAD, Stroke; Risk Factors:Dyslipidemia.  Sonographer:    Ines Bloomer  Koons Referring Phys: [id number] Antonieta Iba   Sonographer Comments: Technically challenging study due to limited acoustic windows. No IV access. IMPRESSIONS   1. Left ventricular ejection fraction, by estimation, is 25 to 30%. The left ventricle has severely decreased function. The left ventricle demonstrates regional wall motion abnormalities (see scoring diagram/findings for description). Left ventricular diastolic parameters are consistent with Grade I diastolic dysfunction (impaired relaxation). There is akinesis of the left ventricular, entire anterior wall and anteroseptal wall. 2. Right ventricular systolic function is low normal. The right ventricular size is normal. 3. The mitral valve is normal in structure. Trivial mitral valve regurgitation. 4. The aortic valve is tricuspid. Aortic valve regurgitation is not visualized. 5. The inferior vena cava is normal in size with greater than 50% respiratory  variability, suggesting right atrial pressure of 3 mmHg.  Comparison(s): 10/14/21 EF 25-30%, RV fxn down.  FINDINGS Left Ventricle: Left ventricular ejection fraction, by estimation, is 25 to 30%. The left ventricle has severely decreased function. The left ventricle demonstrates regional wall motion abnormalities. The left ventricular internal cavity size was normal in size. There is no left ventricular hypertrophy. Left ventricular diastolic parameters are consistent with Grade I diastolic dysfunction (impaired relaxation).  Right Ventricle: The right ventricular size is normal. No increase in right ventricular wall thickness. Right ventricular systolic function is low normal.  Left Atrium: Left atrial size was normal in size.  Right Atrium: Right atrial size was normal in size.  Pericardium: There is no evidence of pericardial effusion.  Mitral Valve: The mitral valve is normal in structure. Trivial mitral valve regurgitation.  Tricuspid Valve: The tricuspid valve is normal in structure. Tricuspid valve regurgitation is not demonstrated.  Aortic Valve: The aortic valve is tricuspid. Aortic valve regurgitation is not visualized. Aortic valve mean gradient measures 1.0 mmHg. Aortic valve peak gradient measures 2.6 mmHg. Aortic valve area, by VTI measures 3.31 cm.  Pulmonic Valve: The pulmonic valve was not well visualized. Pulmonic valve regurgitation is not visualized.  Aorta: The aortic root is normal in size and structure.  Venous: The inferior vena cava is normal in size with greater than 50% respiratory variability, suggesting right atrial pressure of 3 mmHg.  IAS/Shunts: No atrial level shunt detected by color flow Doppler.   LEFT VENTRICLE PLAX 2D LVIDd:         4.70 cm      Diastology LVIDs:         4.10 cm      LV e' medial:    5.44 cm/s LV PW:         0.90 cm      LV E/e' medial:  11.9 LV IVS:        0.90 cm      LV e' lateral:   4.03 cm/s LVOT diam:     2.10 cm       LV E/e' lateral: 16.1 LV SV:         67 LV SV Index:   36 LVOT Area:     3.46 cm  LV Volumes (MOD) LV vol d, MOD A2C: 150.0 ml LV vol d, MOD A4C: 143.0 ml LV vol s, MOD A2C: 96.4 ml LV vol s, MOD A4C: 96.2 ml LV SV MOD A2C:     53.6 ml LV SV MOD A4C:     143.0 ml LV SV MOD BP:      51.4 ml  RIGHT VENTRICLE RV S prime:     8.59 cm/s TAPSE (M-mode):  1.7 cm  LEFT ATRIUM             Index        RIGHT ATRIUM           Index LA Vol (A2C):   43.4 ml 23.50 ml/m  RA Area:     13.20 cm LA Vol (A4C):   41.9 ml 22.69 ml/m  RA Volume:   32.70 ml  17.71 ml/m LA Biplane Vol: 44.5 ml 24.10 ml/m AORTIC VALVE                    PULMONIC VALVE AV Area (Vmax):    3.73 cm     PV Vmax:       1.15 m/s AV Area (Vmean):   3.72 cm     PV Peak grad:  5.3 mmHg AV Area (VTI):     3.31 cm AV Vmax:           80.60 cm/s AV Vmean:          54.500 cm/s AV VTI:            0.201 m AV Peak Grad:      2.6 mmHg AV Mean Grad:      1.0 mmHg LVOT Vmax:         86.80 cm/s LVOT Vmean:        58.600 cm/s LVOT VTI:          0.192 m LVOT/AV VTI ratio: 0.96  AORTA Ao Root diam: 3.10 cm Ao Asc diam:  2.40 cm  MITRAL VALVE MV Area (PHT): 3.66 cm    SHUNTS MV Decel Time: 207 msec    Systemic VTI:  0.19 m MV E velocity: 64.70 cm/s  Systemic Diam: 2.10 cm MV A velocity: 88.70 cm/s MV E/A ratio:  0.73  Debbe Odea MD Electronically signed by Debbe Odea MD Signature Date/Time: 03/16/2023/1:11:07 PM    Final                  Physical Exam:   VS:  BP 128/82   Pulse 63   Ht 5\' 5"  (1.651 m)   Wt 193 lb 12.8 oz (87.9 kg)   SpO2 98%   BMI 32.25 kg/m    Wt Readings from Last 3 Encounters:  08/18/23 193 lb 12.8 oz (87.9 kg)  06/11/23 155 lb (70.3 kg)  02/15/23 174 lb 9.7 oz (79.2 kg)    GEN: Well nourished, well developed in no acute distress NECK: No JVD; No carotid bruits CARDIAC: RRR, no murmurs, rubs, gallops RESPIRATORY:  Clear to auscultation without rales, wheezing or rhonchi   ABDOMEN: Soft, non-tender, non-distended EXTREMITIES:  No edema; No deformity   ASSESSMENT AND PLAN: .    CAD: Late presenting anterior STEMI in 02/2020. R/LHC in 02/2020 with CTO ostial LAD with right to left and left to left collaterals, mild disease (30%) to Lcx. She is unable to undergo MRI due to metal from cerebral aneurysm repair. Today she reports an episode of chest pain a few weeks ago related to anxiety and constipated, no further chest pain. She endorses increased dyspnea on exertion the last few weeks, see #2. On GDMT consisting of aspirin, ezetimibe, metoprolol succinate, rosuvastatin, Entresto, spironolactone   HFrEF/ICM/DOE: Echo 02/2020 LVEF 20-25%. Repeat echo on 03/16/2023 indicated LVEF of 25 to 30%, LV with severely decreased function and RWMA, G1 DD, there is akinesis of the left ventricular, entire anterior wall and anteroseptal wall. Endorses increased dyspnea on exertion  the last few weeks, with intermittent mild lower extremity swelling, she has not taken additional doses of Lasix. She endorses consistent weight gain since January, she feels it is likely related to diet, endorses drinking 12 cans of soda a day. She appears euvolemic and well compensated on exam today. Discussed fluid restrictions, reduced salt intake as well as working to improve her diet. She will report weght gain of 2-3lbs overnight or 5lbs in one week. Continue current GDMT of Jardiance, metoprolol succinate, Entresto, and Lasix. Increase Spironolactone to 25mg  daily. Check BMET today and again in one week.   Mitral regurgitation: Noted to have mild to moderate mitral valve regurgitation on echo in 02/2020, on repeat echo in 02/2023 noted to be trivial.  Hyperlipidemia: Last lipid profile on 11/25/22 indicated total cholesterol of 132, triglycerides 80, HDL 57 and LDL 59. Continue rosuvastatin and ezetimibe.        Dispo: Follow up with Charlsie Quest, NP in one month.   Signed, Rip Harbour, NP

## 2023-08-18 ENCOUNTER — Ambulatory Visit: Payer: 59 | Attending: Cardiology | Admitting: Cardiology

## 2023-08-18 ENCOUNTER — Encounter: Payer: Self-pay | Admitting: Cardiology

## 2023-08-18 VITALS — BP 128/82 | HR 63 | Ht 65.0 in | Wt 193.8 lb

## 2023-08-18 DIAGNOSIS — I25118 Atherosclerotic heart disease of native coronary artery with other forms of angina pectoris: Secondary | ICD-10-CM | POA: Diagnosis not present

## 2023-08-18 DIAGNOSIS — I5022 Chronic systolic (congestive) heart failure: Secondary | ICD-10-CM | POA: Diagnosis not present

## 2023-08-18 DIAGNOSIS — E785 Hyperlipidemia, unspecified: Secondary | ICD-10-CM

## 2023-08-18 DIAGNOSIS — Z79899 Other long term (current) drug therapy: Secondary | ICD-10-CM

## 2023-08-18 DIAGNOSIS — I255 Ischemic cardiomyopathy: Secondary | ICD-10-CM | POA: Diagnosis not present

## 2023-08-18 DIAGNOSIS — R0609 Other forms of dyspnea: Secondary | ICD-10-CM

## 2023-08-18 MED ORDER — ENTRESTO 24-26 MG PO TABS: 1.0000 | ORAL_TABLET | Freq: Two times a day (BID) | ORAL | 3 refills | Status: DC

## 2023-08-18 MED ORDER — SPIRONOLACTONE 25 MG PO TABS: 25.0000 mg | ORAL_TABLET | Freq: Every day | ORAL | 3 refills | Status: DC

## 2023-08-18 NOTE — Patient Instructions (Signed)
Medication Instructions:  Increase Spironolactone to 25 mg daily from 12.5 mg daily.   *If you need a refill on your cardiac medications before your next appointment, please call your pharmacy*   Lab Work: Your provider would like for you to have following labs drawn today BMP.    Your provider would like for you to return in 1 week to have the following labs drawn: BMP.   Please go to [facility] [address] ([facility] [address], [place] [address] You do not need an appointment.  They are open from 7:30 am-4 pm.  Lunch from 1:00 pm- 2:00 pm    You may also go to any of these [facility] locations:  [employer]  - [address] [employer] [address] Chief Technology Officer)     If you have labs (blood work) drawn today and your tests are completely normal, you will receive your results only by: [employer] (if you have MyChart) OR A paper copy in the mail If you have any lab test that is abnormal or we need to change your treatment, we will call you to review the results.   Testing/Procedures: None ordered   Follow-Up: At [facility], you and your health needs are our priority.  As part of our continuing mission to provide you with exceptional heart care, we have created designated Provider Care Teams.  These Care Teams include your primary Cardiologist (physician) and Advanced Practice Providers (APPs -  Physician Assistants and Nurse Practitioners) who all work together to provide you with the care you need, when you need it.  We recommend signing up for the patient portal called "MyChart".  Sign up information is provided on this After Visit Summary.  MyChart is used to connect with patients for Virtual Visits (Telemedicine).  Patients are able to view lab/test results, encounter notes, upcoming appointments, etc.  Non-urgent messages can be sent to your provider as well.   To learn more about what you can do with MyChart, go to  [email or web address].    Your next appointment:   1 month(s)  Provider:   You may see or one of the following Advanced Practice Providers on your designated Care Team:    Charlsie Quest, NP    Other Instructions Please notify the office of 2-3 pound weight gain in a day or 5 pound weight gain in a week. Please decrease fluid intake.

## 2023-08-19 ENCOUNTER — Other Ambulatory Visit: Payer: Self-pay | Admitting: Emergency Medicine

## 2023-08-19 DIAGNOSIS — I214 Non-ST elevation (NSTEMI) myocardial infarction: Secondary | ICD-10-CM

## 2023-08-19 DIAGNOSIS — Z79899 Other long term (current) drug therapy: Secondary | ICD-10-CM

## 2023-08-19 LAB — BASIC METABOLIC PANEL
BUN/Creatinine Ratio: 11 — ABNORMAL LOW (ref 12–28)
BUN: 10 mg/dL (ref 8–27)
CO2: 22 mmol/L (ref 20–29)
Calcium: 9.5 mg/dL (ref 8.7–10.3)
Chloride: 108 mmol/L — ABNORMAL HIGH (ref 96–106)
Creatinine, Ser: 0.88 mg/dL (ref 0.57–1.00)
Glucose: 106 mg/dL — ABNORMAL HIGH (ref 70–99)
Potassium: 4.2 mmol/L (ref 3.5–5.2)
Sodium: 143 mmol/L (ref 134–144)
eGFR: 73 mL/min/{1.73_m2} (ref >59)

## 2023-08-25 LAB — BASIC METABOLIC PANEL
BUN/Creatinine Ratio: 11 — ABNORMAL LOW (ref 12–28)
BUN: 12 mg/dL (ref 8–27)
CO2: 21 mmol/L (ref 20–29)
Calcium: 9.8 mg/dL (ref 8.7–10.3)
Chloride: 107 mmol/L — ABNORMAL HIGH (ref 96–106)
Creatinine, Ser: 1.05 mg/dL — ABNORMAL HIGH (ref 0.57–1.00)
Glucose: 105 mg/dL — ABNORMAL HIGH (ref 70–99)
Potassium: 4.4 mmol/L (ref 3.5–5.2)
Sodium: 142 mmol/L (ref 134–144)
eGFR: 59 mL/min/{1.73_m2} — ABNORMAL LOW (ref >59)

## 2023-08-26 ENCOUNTER — Telehealth: Payer: Self-pay | Admitting: Emergency Medicine

## 2023-08-26 NOTE — Telephone Encounter (Signed)
Called patient and notified her of the following from [facility], NP.  Please let Hannah Martinez know that she had a slight increase in her creatinine with the increase in spironolactone, would recommend staying hydrated with water and repeat BMET in two weeks. Electrolytes are normal. Continue current medications and follow up with Charlsie Quest, NP on 9/19.   Patient verbalizes understanding. Orders placed for BMP.

## 2023-08-26 NOTE — Addendum Note (Signed)
Addended by: Jani Gravel on: 08/26/2023 04:44 PM   Modules accepted: Orders

## 2023-08-30 ENCOUNTER — Other Ambulatory Visit: Payer: Self-pay

## 2023-08-30 ENCOUNTER — Telehealth: Payer: Self-pay

## 2023-08-30 NOTE — Telephone Encounter (Signed)
Left VM reminding patient to come into our office to have labs drawn as requested by her provider.

## 2023-08-30 NOTE — Telephone Encounter (Signed)
Patient returned RN's call and stated she will be in to have her blood work done on Friday, 9/6.

## 2023-08-30 NOTE — Telephone Encounter (Signed)
Left a message for the patient to call back.     Rip Harbour, NP 08/26/2023 12:50 PM EDT Back to Top    Please let Hannah Martinez know that she had a slight increase in her creatinine with the increase in spironolactone, would recommend staying hydrated with water and repeat BMET in two weeks. Electrolytes are normal. Continue current medications and follow up with Charlsie Quest, NP on 9/19.

## 2023-09-02 ENCOUNTER — Other Ambulatory Visit
Admission: RE | Admit: 2023-09-02 | Discharge: 2023-09-02 | Disposition: A | Discharge status: Home / Self Care | Payer: 59 | Source: Ambulatory Visit | Attending: Cardiology | Admitting: Cardiology

## 2023-09-02 DIAGNOSIS — I214 Non-ST elevation (NSTEMI) myocardial infarction: Secondary | ICD-10-CM | POA: Insufficient documentation

## 2023-09-02 DIAGNOSIS — Z79899 Other long term (current) drug therapy: Secondary | ICD-10-CM | POA: Diagnosis present

## 2023-09-02 LAB — BASIC METABOLIC PANEL WITH GFR
Anion gap: 7 (ref 5–15)
BUN: 12 mg/dL (ref 8–23)
CO2: 24 mmol/L (ref 22–32)
Calcium: 9.1 mg/dL (ref 8.9–10.3)
Chloride: 106 mmol/L (ref 98–111)
Creatinine, Ser: 0.9 mg/dL (ref 0.44–1.00)
GFR, Estimated: >60 mL/min
Glucose, Bld: 112 mg/dL — ABNORMAL HIGH (ref 70–99)
Potassium: 3.6 mmol/L (ref 3.5–5.1)
Sodium: 137 mmol/L (ref 135–145)

## 2023-09-05 NOTE — Group Note (Deleted)

## 2023-09-08 ENCOUNTER — Other Ambulatory Visit: Payer: Self-pay | Admitting: Cardiovascular Disease

## 2023-09-08 ENCOUNTER — Telehealth: Payer: Self-pay | Admitting: Cardiovascular Disease

## 2023-09-08 NOTE — Telephone Encounter (Signed)
*  STAT* If patient is at the pharmacy, call can be transferred to refill team.   1. Which medications need to be refilled? (please list name of each medication and dose if known) ezetimibe (ZETIA) 10 MG tablet   2. Would you like to learn more about the convenience, safety, & potential cost savings by using the [facility]? No      3. Are you open to using the [facility] (Type [facility]. No  ).   4. Which pharmacy/location (including street and city if local pharmacy) is medication to be sent to? [facility] - [place] [address]    5. Do they need a 30 day or 90 day supply? 90

## 2023-09-09 MED ORDER — EZETIMIBE 10 MG PO TABS: 10.0000 mg | ORAL_TABLET | Freq: Every day | ORAL | 0 refills | Status: DC

## 2023-09-09 NOTE — Telephone Encounter (Signed)
Requested Prescriptions   Signed Prescriptions Disp Refills   ezetimibe (ZETIA) 10 MG tablet 90 tablet 0    Sig: Take 1 tablet (10 mg total) by mouth daily.    Authorizing Provider: Antonieta Iba    Ordering User: Guerry Minors

## 2023-09-09 NOTE — Telephone Encounter (Signed)
OK to continue refilling Omeprazole?

## 2023-09-14 NOTE — Progress Notes (Unsigned)
Cardiology Office Note:  .   Date:  09/15/2023  ID:  Hannah Martinez, DOB [birth year], MRN [id number] PCP: [facility], [facility] Providers Cardiologist:  Julien Nordmann, MD    History of Present Illness: Hannah Martinez is a 67 y.o. female with a past medical history of HFrEF, CAD with chronically occluded ostial LAD with collaterals, ICM, CVA, hyperlipidemia, GERD, cerebral aneurysm status post clipping, who is here today for follow-up.  She underwent cerebral aneurysm repair at [facility] in 1999.  In 2003 had progressive left hemiparesis and was found to have a large right middle cerebral artery aneurysm, proximal aneurysm clip with reoperation at [facility] in 04/2002.  In 09/2012 she had a lipoma removed from her right frontal bone that was complicated by MRSA infection.  She presented to [facility] in 2021 with cough, wheezing, and chest pain.  She underwent echo in 02/2020 that showed an EF of 20-25%, mild to moderate MR.  [facility] showed chronic occluded ostial LAD with right to left and left to left collaterals, mild disease (30%) to left circumflex.  RHC revealed severely elevated filling pressures, moderate pulmonary hypertension, mildly reduced cardiac output.  Suspect LAD infarct 2 weeks prior to cath and questioned whether or not the anterior wall is viable.  Cough and shortness of breath were attributed to severe volume overload and she was diuresed and started on GDMT.  Repeat echocardiogram 03/16/2023 revealed an LVEF of 25-30%, severely decreased function and RWMA, G1 trivial mitral valve regurgitation.  She was last seen in clinic 08/18/2023 noted an episode of chest pain a few weeks prior but attributed to increased anxiety related to constipation status post hernia repair.  It had resolved following bowel movements with no further recurrence.  She continued to suffer from dyspnea on exertion and she stated worse in the last couple of weeks and noted mild lower extremity  edema.  Spironolactone was increased to 25 mg daily and she was scheduled for a BMP.  She returns to clinic today stating that she is doing well. She is excited to see that she is down 8 pounds since her last visit.  She denies any current chest pain or shortness of breath.  She states she occasionally has some swelling to her left lower extremity but that is improving.  She has decreased the amount of sodas that she is drinking today which she correlates to her weight loss.  She has been compliant with her medications and denies any hospitalizations or visits to the emergency department.  ROS: 10 point review of systems has been reviewed and considered negative with exception of what is been listed in the HPI  Studies Reviewed: Marland Kitchen       TTE 03/16/23 1. Left ventricular ejection fraction, by estimation, is 25 to 30%. The  left ventricle has severely decreased function. The left ventricle  demonstrates regional wall motion abnormalities (see scoring  diagram/findings for description). Left ventricular  diastolic parameters are consistent with Grade I diastolic dysfunction  (impaired relaxation). There is akinesis of the left ventricular, entire  anterior wall and anteroseptal wall.   2. Right ventricular systolic function is low normal. The right  ventricular size is normal.   3. The mitral valve is normal in structure. Trivial mitral valve  regurgitation.   4. The aortic valve is tricuspid. Aortic valve regurgitation is not  visualized.   5. The inferior vena cava is normal in size with greater than 50%  respiratory variability,  suggesting right atrial pressure of 3 mmHg.   TTE 10/14/2021 1. Left ventricular ejection fraction, by estimation, is 25 to 30%. The  left ventricle has severely decreased function. The left ventricle  demonstrates regional wall motion abnormalities (see scoring  diagram/findings for description). Left ventricular  diastolic parameters are consistent with Grade I  diastolic dysfunction  (impaired relaxation). There is akinesis of the left ventricular, entire  septal wall.   2. Right ventricular systolic function is low normal. The right  ventricular size is normal.   3. The mitral valve is normal in structure. No evidence of mitral valve  regurgitation.   4. The aortic valve was not well visualized. Aortic valve regurgitation  is not visualized.   5. The inferior vena cava is normal in size with greater than 50%  respiratory variability, suggesting right atrial pressure of 3 mmHg.   TTE7/28/21 1. Left ventricular ejection fraction, by estimation, is 25 to 30%. The  left ventricle has severely decreased function. The left ventricle  demonstrates regional wall motion abnormalities (see scoring  diagram/findings for description). Left ventricular  diastolic parameters are consistent with Grade I diastolic dysfunction  (impaired relaxation). There is akinesis of the left ventricular, entire  septal wall.   2. Right ventricular systolic function is normal. The right ventricular  size is normal.   3. The mitral valve is normal in structure. No evidence of mitral valve  regurgitation.   4. The aortic valve is normal in structure. Aortic valve regurgitation is  not visualized.   5. The inferior vena cava is normal in size with greater than 50%  respiratory variability, suggesting right atrial pressure of 3 mmHg.   [facility] 02/2020 Ost LAD to Prox LAD lesion is 100% stenosed. Mid Cx lesion is 30% stenosed. 3rd Mrg lesion is 30% stenosed.   1.  Left dominant coronary arteries with chronically occluded ostial LAD with some right to left and left to left collaterals.  Mild disease affecting the left circumflex. 2.  Right heart catheterization showed severely elevated filling pressures with RA pressure of 10 mmHg, pulmonary capillary wedge pressure of 31 mmHg with prominent V wave suggestive of mitral regurgitation, moderate pulmonary hypertension at 55/31  mmHg and mildly reduced cardiac output at 4.56 with a cardiac index of 2.47.  LVEDP was 34 mmHg.   Recommendations: The patient likely had an LAD infarct 2 weeks ago.  I suspect that the anterior wall is now nonviable.  Unfortunately, she is left with severe ischemic cardiomyopathy and right heart catheterization shows evidence of severe volume overload likely responsible for her symptoms of shortness of breath and dry cough. I am going to start intravenous diuresis with furosemide 40 mg twice daily.  I added small dose carvedilol and losartan.  Recommend small dose spironolactone before hospital discharge and consider switching losartan to [facility] as an outpatient. Recommend aggressive medical therapy for coronary artery disease.    TTE 02/25/20 1. Left ventricular ejection fraction, by estimation, is 20 to 25%. The  left ventricle has severely decreased function. The left ventricle  demonstrates regional wall motion abnormalities (see scoring  diagram/findings for description). The left  ventricular internal cavity size was mildly dilated. Left ventricular  diastolic parameters are consistent with Grade II diastolic dysfunction  (pseudonormalization). There is akinesis of the left ventricular,  mid-apical anteroseptal wall, anterior segment   and apical segment. Prominent apical trabeculation with no clear apical  thrombus with echo contrast.   2. Right ventricular systolic function is normal. The  right ventricular  size is normal. Tricuspid regurgitation signal is inadequate for assessing  PA pressure.   3. Left atrial size was mildly dilated.   4. The mitral valve is normal in structure and function. Mild to moderate  mitral valve regurgitation. No evidence of mitral stenosis.   5. The aortic valve is normal in structure and function. Aortic valve  regurgitation is not visualized. No aortic stenosis is present.  Risk Assessment/Calculations:             Physical Exam:   VS:  BP (!)  98/50 (BP Location: Right Arm, Patient Position: Sitting)   Pulse 67   Ht 5\' 5"  (1.651 m)   Wt 185 lb 12.8 oz (84.3 kg)   SpO2 98%   BMI 30.92 kg/m    Wt Readings from Last 3 Encounters:  09/15/23 185 lb 12.8 oz (84.3 kg)  08/18/23 193 lb 12.8 oz (87.9 kg)  06/11/23 155 lb (70.3 kg)    GEN: Well nourished, well developed in no acute distress NECK: No JVD; No carotid bruits CARDIAC: RRR, no murmurs, rubs, gallops RESPIRATORY:  Clear to auscultation without rales, wheezing or rhonchi  ABDOMEN: Soft, non-tender, non-distended EXTREMITIES: Trace pretibial edema; No deformity   ASSESSMENT AND PLAN: .   Coronary artery disease with late presenting anterior STEMI in 02/2020.  Right and left heart catheterization in 3/21 with CTO of the ostial LAD with right to left and left to left collaterals with mild disease in the left circumflex 30%.  She is unable to undergo MRI due to metal from cerebral aneurysm repair.  Today she states that she has not had any evidence of recurrent chest discomfort or anginal equivalents.  She is continued on aspirin 81 mg daily, ezetimibe 10 mg daily, rosuvastatin 40 mg daily.  No further testing is needed at this time.  HFrEF/ICM with an LVEF of 25-minute history of 30%, G1 DD, and trivial mitral regurgitation.  She has lost 8 pounds since her previous visit.  She is continued on GDMT Jardiance 10 mg daily, furosemide 40 mg, Toprol-XL 25 mg daily and Entresto 24/26 mg twice daily and spironolactone 25 mg daily.  Spironolactone was recently increased to 25 mg daily with BMP repeated which showed stable kidney function.  She is encouraged to weigh daily, watch her sodium intake and fluid intake.  Mixed hyperlipidemia with last LDL at goal at 59.  She has been continued on ezetimibe and rosuvastatin.  Will need an updated lipid panel in November.  Mitral regurgitation previously noted on echocardiogram with mild to moderate.  Last updated echocardiogram in 2024 revealed  trivial MR.  Continue to monitor with surveillance studies.       Dispo: Patient to return to clinic to see MD/APP in 3 months or sooner if needed  Signed, Khamiya Varin, NP

## 2023-09-15 ENCOUNTER — Ambulatory Visit: Payer: 59 | Attending: Cardiology | Admitting: Cardiology

## 2023-09-15 ENCOUNTER — Encounter: Payer: Self-pay | Admitting: Cardiology

## 2023-09-15 VITALS — BP 98/50 | HR 67 | Ht 65.0 in | Wt 185.8 lb

## 2023-09-15 DIAGNOSIS — E785 Hyperlipidemia, unspecified: Secondary | ICD-10-CM

## 2023-09-15 DIAGNOSIS — I5022 Chronic systolic (congestive) heart failure: Secondary | ICD-10-CM

## 2023-09-15 DIAGNOSIS — I25118 Atherosclerotic heart disease of native coronary artery with other forms of angina pectoris: Secondary | ICD-10-CM | POA: Diagnosis not present

## 2023-09-15 DIAGNOSIS — I340 Nonrheumatic mitral (valve) insufficiency: Secondary | ICD-10-CM

## 2023-09-15 DIAGNOSIS — I255 Ischemic cardiomyopathy: Secondary | ICD-10-CM | POA: Diagnosis not present

## 2023-09-15 NOTE — Patient Instructions (Signed)
Medication Instructions:  Your physician recommends that you continue on your current medications as directed. Please refer to the Current Medication list given to you today.  *If you need a refill on your cardiac medications before your next appointment, please call your pharmacy*  Lab Work: -None ordered  Testing/Procedures: -None ordered  Follow-Up: At [facility], you and your health needs are our priority.  As part of our continuing mission to provide you with exceptional heart care, we have created designated Provider Care Teams.  These Care Teams include your primary Cardiologist (physician) and Advanced Practice Providers (APPs -  Physician Assistants and Nurse Practitioners) who all work together to provide you with the care you need, when you need it.  Your next appointment:   3 month(s)  Provider:   You may see Julien Nordmann, MD or one of the following Advanced Practice Providers on your designated Care Team:   Nicolasa Ducking, NP Eula Listen, PA-C Cadence Fransico Michael, PA-C Charlsie Quest, NP    Other Instructions -Congratulations on your weight loss

## 2023-12-06 ENCOUNTER — Encounter: Payer: Self-pay | Admitting: Family Medicine

## 2023-12-09 ENCOUNTER — Other Ambulatory Visit: Payer: Self-pay | Admitting: Cardiovascular Disease

## 2023-12-29 ENCOUNTER — Ambulatory Visit: Payer: 59 | Attending: Cardiology | Admitting: Cardiology

## 2023-12-29 ENCOUNTER — Ambulatory Visit: Payer: 59

## 2023-12-29 ENCOUNTER — Encounter: Payer: Self-pay | Admitting: Cardiology

## 2023-12-29 VITALS — BP 122/79 | HR 61 | Ht 65.0 in | Wt 189.4 lb

## 2023-12-29 DIAGNOSIS — I25118 Atherosclerotic heart disease of native coronary artery with other forms of angina pectoris: Secondary | ICD-10-CM | POA: Diagnosis not present

## 2023-12-29 DIAGNOSIS — I214 Non-ST elevation (NSTEMI) myocardial infarction: Secondary | ICD-10-CM

## 2023-12-29 DIAGNOSIS — I340 Nonrheumatic mitral (valve) insufficiency: Secondary | ICD-10-CM

## 2023-12-29 DIAGNOSIS — I5020 Unspecified systolic (congestive) heart failure: Secondary | ICD-10-CM

## 2023-12-29 DIAGNOSIS — I255 Ischemic cardiomyopathy: Secondary | ICD-10-CM

## 2023-12-29 DIAGNOSIS — E785 Hyperlipidemia, unspecified: Secondary | ICD-10-CM

## 2023-12-29 DIAGNOSIS — I639 Cerebral infarction, unspecified: Secondary | ICD-10-CM

## 2023-12-29 DIAGNOSIS — R002 Palpitations: Secondary | ICD-10-CM

## 2023-12-29 MED ORDER — ENTRESTO 24-26 MG PO TABS: 1.0000 | ORAL_TABLET | Freq: Two times a day (BID) | ORAL | 3 refills | Status: DC

## 2023-12-29 NOTE — Progress Notes (Signed)
 Cardiology Office Note:  .   Date:  12/29/2023  ID:  Hannah Martinez, DOB 1956/11/23, MRN [id number] PCP: [facility], [facility] Providers Cardiologist:  Evalene Lunger, MD    History of Present Illness: Hannah Martinez is a 68 y.o. female with a past medical history of HFrEF, CAD with chronically occluded ostial LAD with collaterals, ICM, CVA, hyperlipidemia, GERD, cerebral aneurysm status post clipping, who is here today for follow-up.   She underwent cerebral aneurysm repair at [facility] in 1999. In 2003 had progressive left hemiparesis and was found to have a large right middle cerebral artery aneurysm, proximal aneurysm clip with reoperation at [facility] in 04/2002. In 09/2012 she had a lipoma removed from her right frontal bone that was complicated by MRSA infection. She presented to [facility] in 2021 with cough, wheezing, and chest pain. She underwent echo in 02/2020 that showed an EF of 20-25%, mild to moderate MR. [facility] showed chronic occluded ostial LAD with right to left and left to left collaterals, mild disease (30%) to left circumflex. RHC revealed severely elevated filling pressures, moderate pulmonary hypertension, mildly reduced cardiac output. Suspect LAD infarct 2 weeks prior to cath and questioned whether or not the anterior wall is viable. Cough and shortness of breath were attributed to severe volume overload and she was diuresed and started on GDMT. Repeat echocardiogram 03/16/2023 revealed an LVEF of 25-30%, severely decreased function and RWMA, G1 trivial mitral valve regurgitation.    She was last seen in clinic 09/15/2023.  She denied any current chest pain or shortness of breath.  Occasional swelling to her lower extremities.  She was continued on her current medication regimen without any changes or further testing that was ordered.  She returns to clinic today accompanied by her grandson.  She denies any chest pain, shortness of breath, peripheral edema.   He is having increased episodes of palpitations over the last several weeks.  She denies any increased caffeine or recent sickness.  States that she has been compliant with her current medication regimen without any adverse effects.  Denies any recent hospitalizations or visits to the emergency department.  ROS: 10 point review of systems has been reviewed and considered negative with the exception of what is been listed in the HPI  Studies Reviewed: SABRA        TTE 03/16/23 1. Left ventricular ejection fraction, by estimation, is 25 to 30%. The  left ventricle has severely decreased function. The left ventricle  demonstrates regional wall motion abnormalities (see scoring  diagram/findings for description). Left ventricular  diastolic parameters are consistent with Grade I diastolic dysfunction  (impaired relaxation). There is akinesis of the left ventricular, entire  anterior wall and anteroseptal wall.   2. Right ventricular systolic function is low normal. The right  ventricular size is normal.   3. The mitral valve is normal in structure. Trivial mitral valve  regurgitation.   4. The aortic valve is tricuspid. Aortic valve regurgitation is not  visualized.   5. The inferior vena cava is normal in size with greater than 50%  respiratory variability, suggesting right atrial pressure of 3 mmHg.    TTE 10/14/2021 1. Left ventricular ejection fraction, by estimation, is 25 to 30%. The  left ventricle has severely decreased function. The left ventricle  demonstrates regional wall motion abnormalities (see scoring  diagram/findings for description). Left ventricular  diastolic parameters are consistent with Grade I diastolic dysfunction  (impaired relaxation). There is akinesis  of the left ventricular, entire  septal wall.   2. Right ventricular systolic function is low normal. The right  ventricular size is normal.   3. The mitral valve is normal in structure. No evidence of mitral  valve  regurgitation.   4. The aortic valve was not well visualized. Aortic valve regurgitation  is not visualized.   5. The inferior vena cava is normal in size with greater than 50%  respiratory variability, suggesting right atrial pressure of 3 mmHg.    TTE7/28/21 1. Left ventricular ejection fraction, by estimation, is 25 to 30%. The  left ventricle has severely decreased function. The left ventricle  demonstrates regional wall motion abnormalities (see scoring  diagram/findings for description). Left ventricular  diastolic parameters are consistent with Grade I diastolic dysfunction  (impaired relaxation). There is akinesis of the left ventricular, entire  septal wall.   2. Right ventricular systolic function is normal. The right ventricular  size is normal.   3. The mitral valve is normal in structure. No evidence of mitral valve  regurgitation.   4. The aortic valve is normal in structure. Aortic valve regurgitation is  not visualized.   5. The inferior vena cava is normal in size with greater than 50%  respiratory variability, suggesting right atrial pressure of 3 mmHg.    [facility] 02/2020 Ost LAD to Prox LAD lesion is 100% stenosed. Mid Cx lesion is 30% stenosed. 3rd Mrg lesion is 30% stenosed.   1.  Left dominant coronary arteries with chronically occluded ostial LAD with some right to left and left to left collaterals.  Mild disease affecting the left circumflex. 2.  Right heart catheterization showed severely elevated filling pressures with RA pressure of 10 mmHg, pulmonary capillary wedge pressure of 31 mmHg with prominent V wave suggestive of mitral regurgitation, moderate pulmonary hypertension at 55/31 mmHg and mildly reduced cardiac output at 4.56 with a cardiac index of 2.47.  LVEDP was 34 mmHg.   Recommendations: The patient likely had an LAD infarct 2 weeks ago.  I suspect that the anterior wall is now nonviable.  Unfortunately, she is left with severe ischemic  cardiomyopathy and right heart catheterization shows evidence of severe volume overload likely responsible for her symptoms of shortness of breath and dry cough. I am going to start intravenous diuresis with furosemide  40 mg twice daily.  I added small dose carvedilol  and losartan .  Recommend small dose spironolactone  before hospital discharge and consider switching losartan  to Entresto  as an outpatient. Recommend aggressive medical therapy for coronary artery disease.     TTE 02/25/20 1. Left ventricular ejection fraction, by estimation, is 20 to 25%. The  left ventricle has severely decreased function. The left ventricle  demonstrates regional wall motion abnormalities (see scoring  diagram/findings for description). The left  ventricular internal cavity size was mildly dilated. Left ventricular  diastolic parameters are consistent with Grade II diastolic dysfunction  (pseudonormalization). There is akinesis of the left ventricular,  mid-apical anteroseptal wall, anterior segment   and apical segment. Prominent apical trabeculation with no clear apical  thrombus with echo contrast.   2. Right ventricular systolic function is normal. The right ventricular  size is normal. Tricuspid regurgitation signal is inadequate for assessing  PA pressure.   3. Left atrial size was mildly dilated.   4. The mitral valve is normal in structure and function. Mild to moderate  mitral valve regurgitation. No evidence of mitral stenosis.   5. The aortic valve is normal in structure and  function. Aortic valve  regurgitation is not visualized. No aortic stenosis is present.  Risk Assessment/Calculations:             Physical Exam:   VS:  BP 122/79   Pulse 61   Ht 5' 5 (1.651 m)   Wt 189 lb 6.4 oz (85.9 kg)   SpO2 100%   BMI 31.52 kg/m    Wt Readings from Last 3 Encounters:  12/29/23 189 lb 6.4 oz (85.9 kg)  09/15/23 185 lb 12.8 oz (84.3 kg)  08/18/23 193 lb 12.8 oz (87.9 kg)    GEN: Well  nourished, well developed in no acute distress NECK: No JVD; No carotid bruits CARDIAC: RRR, no murmurs, rubs, gallops RESPIRATORY:  Clear to auscultation without rales, wheezing or rhonchi  ABDOMEN: Soft, non-tender, non-distended EXTREMITIES:  Trace pretibial edema; No deformity   ASSESSMENT AND PLAN: .   Palpitations that been worsening over the last several weeks.  She also notes a decrease in her caffeine intake.  She is currently on Toprol -XL 25 mg daily.  She will be placed on a ZIO XT monitor for 2 weeks to rule out arrhythmia.   Coronary artery disease of late presenting anterior STEMI in 02/2020.  Right and left heart catheterization in 02/2020 with a CTO of the ostial LAD and right to left and left to left collaterals with mild disease in the left circumflex of 30%.  She is unable to undergo MRI due to metal from cerebral aneurysm repair.  She denies any recurrent chest discomfort or shortness of breath.  She has been continued on aspirin  81 mg daily, ezetimibe  10 mg daily, rosuvastatin  40 mg daily.    HFrEF/ICM with an LVEF of 25-30%, G1 DD, trivial MR.  She denies any shortness of breath or peripheral edema.  She has been continued on GDMT of Jardiance  10 mg daily, furosemide  40 mg daily, Entresto  24/26 mg twice daily which she is requesting refill on today Toprol -XL 25 mg daily, spironolactone  25 mg daily.  Recent BMP revealed stable electrolytes and kidney function.  Mixed hyperlipidemia with an LDL last: 26.  She has been sent for repeat direct LDL today for updated lab results.  She has been continued on ezetimibe  and rosuvastatin  at this time.  Mitral regurgitation previously noted on echocardiogram was mild to moderate.  Last echocardiogram in 2024 revealed trivial MR.  Continue to monitor with surveillance studies.  Prior CVA with residual deficits.  She is continued on aspirin  81 mg daily rosuvastatin  and ezetimibe .       Dispo: Patient return to clinic to see MD/APP in 8 to  10 weeks or sooner if needed for reevaluation of symptoms and once monitor has been returned.  Signed, Glenden Rossell, NP

## 2023-12-29 NOTE — Patient Instructions (Signed)
 Medication Instructions:  - No changes *If you need a refill on your cardiac medications before your next appointment, please call your pharmacy*  Lab Work: Your provider would like for you to have following labs drawn today CBC. BMET, Direct LDL.   If you have labs (blood work) drawn today and your tests are completely normal, you will receive your results only by: MyChart Message (if you have MyChart) OR A paper copy in the mail If you have any lab test that is abnormal or we need to change your treatment, we will call you to review the results.  Testing/Procedures: Heart Monitor:  Your physician has requested you wear a ZIO patch heart monitor for 14 days.  Your monitor will be mailed to your home address within 3-5 business days. This is sent via [employer] from [employer]. However, if you have not received your monitor after 5 business days please send us  a MyChart message or call the office at [phone], so we may follow up on this for you.   This monitor is a medical device (single patch monitor) that records the heart's electrical activity. Doctors most often use these monitors to diagnose arrhythmias. Arrhythmias are problems with the speed or rhythm of the heartbeat.   [employer] supplies 1 patch per enrollment. Additional stickers are not available.  YouTube search Zio patch heart monitor (3:46) for step-by-step application instructions Please DO NOT apply the patch if you will be having a Nuclear Stress Test, Echocardiogram, Cardiac CT, Cardiac MRI, Chest X-ray during the period you would be wearing the monitor. The patch cannot be worn during these tests.  You cannot remove and re-apply the ZIO patch monitor.   Applying the Monitor: Once you receive your monitor, this will include a small razor, abrader, and 4 alcohol pads. Shave hair from upper left chest Rub abrader disc in 40 strokes over the left upper chest as indicated in your monitor instructions Clean area  with 4 enclosed alcohol pads (there may be a mild & brief stinging sensation over the newly abraded area, but this is normal). Let dry Apply patch as indicated in monitor instructions. Patch will be placed under collarbone on the left side of the chest with arrow pointing upward. Rub adhesive wings for 2 minutes. Remove white label marked 1. Remove the white label marked 2. Rub patch adhesive wings for an 2 minutes.  While looking in a mirror, press and release button in the center of the patch. You may hear a click. A small green light will flash 4-6 times and then stop. This will be your indicator that the monitor has been turned on.  Wearing the Monitor: Avoid showering during the first 24 hours of wearing the monitor.  After 24 hours you may shower with the patch on. Take brief showers with your back facing the shower head.  Avoid excessive sweating to help maximize wear time. Do not submerge the device, no hot tubs, and no swimming pools. Keep any lotions or oils away from the patch. Press the button if you feel a symptom. You will hear a small click. Record date, time, and symptoms in the Patient Logbook or App.  Monitor Issues: Call [employer] Customer Care at [phone] if you have questions regarding your Zio Patch Monitor. Call them immediately if you see an orange/ amber colored light blinking on your monitor. If your monitor falls off and you cannot get this reapplied or if you need suggestions for securing your monitor call  [employer] at [phone].   Returning the Monitor: Once you have completed wearing your monitor, follow instructions on the last 2 pages of the Patient Logbook. Stick monitor patch on to the last page of the Patient Logbook.  Place Patient Logbook with monitor in the return box provided. Use locking tab on box and tape box closed securely. The return box has pre-paid postage on it.  Place the return box in the regular [place]  Mail box as soon as  possible It will take anywhere from 1-2 weeks for your provider to receive and review your results once you mail this back. If for some reason you have misplaced your return box then call our office and we can provide another box and/or mail it off for you.   Billing  and Patient Assistance Program Information: We have supplied [employer] with any of your insurance information on file for billing purposes. [employer] offers a sliding scale Patient Assistance Program for patients that do not have insurance, or whose insurance does not completely cover the cost of the ZIO monitor. You must apply for the Patient Assistance Program to qualify for this discounted rate. To apply, please call [employer] at [phone], select option 1, ask to apply for the Patient Assistance Program. [employer] will ask your household income, and how many people are in your household. They will quote your out-of-pocket cost based on that information. [employer] will also be able to set up for a 18-month, interest-free payment plan if needed.      Follow-Up: At [facility], you and your health needs are our priority.  As part of our continuing mission to provide you with exceptional heart care, we have created designated Provider Care Teams.  These Care Teams include your primary Cardiologist (physician) and Advanced Practice Providers (APPs -  Physician Assistants and Nurse Practitioners) who all work together to provide you with the care you need, when you need it.  Your next appointment:   Mid to late April  Provider:   Tylene Lunch, NP

## 2023-12-30 LAB — BASIC METABOLIC PANEL
BUN/Creatinine Ratio: 11 — ABNORMAL LOW (ref 12–28)
BUN: 12 mg/dL (ref 8–27)
CO2: 22 mmol/L (ref 20–29)
Calcium: 10 mg/dL (ref 8.7–10.3)
Chloride: 108 mmol/L — ABNORMAL HIGH (ref 96–106)
Creatinine, Ser: 1.05 mg/dL — ABNORMAL HIGH (ref 0.57–1.00)
Glucose: 106 mg/dL — ABNORMAL HIGH (ref 70–99)
Potassium: 4.2 mmol/L (ref 3.5–5.2)
Sodium: 143 mmol/L (ref 134–144)
eGFR: 59 mL/min/{1.73_m2} — ABNORMAL LOW (ref >59)

## 2023-12-30 LAB — CBC
Hematocrit: 44.2 % (ref 34.0–46.6)
Hemoglobin: 14.3 g/dL (ref 11.1–15.9)
MCH: 33.1 pg — ABNORMAL HIGH (ref 26.6–33.0)
MCHC: 32.4 g/dL (ref 31.5–35.7)
MCV: 102 fL — ABNORMAL HIGH (ref 79–97)
Platelets: 215 10*3/uL (ref 150–450)
RBC: 4.32 x10E6/uL (ref 3.77–5.28)
RDW: 11.2 % — ABNORMAL LOW (ref 11.7–15.4)
WBC: 3.9 10*3/uL (ref 3.4–10.8)

## 2023-12-30 LAB — LDL CHOLESTEROL, DIRECT: LDL Direct: 127 mg/dL — ABNORMAL HIGH (ref 0–99)

## 2023-12-30 NOTE — Progress Notes (Signed)
 Kidney function and electrolytes remain stable.  Direct LDL 127 continues to remain elevated.  With continued high intensity rosuvastatin  and ezetimibe  and would recommend starting Repatha  140 mg subcutaneously via autoinjector every 2 weeks.  Recommend repeating lipid and hepatic in 10 to 12 weeks after initiation of therapy.

## 2024-01-01 DIAGNOSIS — R002 Palpitations: Secondary | ICD-10-CM | POA: Diagnosis not present

## 2024-01-01 DIAGNOSIS — I639 Cerebral infarction, unspecified: Secondary | ICD-10-CM

## 2024-01-01 DIAGNOSIS — I214 Non-ST elevation (NSTEMI) myocardial infarction: Secondary | ICD-10-CM

## 2024-01-05 ENCOUNTER — Other Ambulatory Visit: Payer: Self-pay

## 2024-01-05 DIAGNOSIS — E7800 Pure hypercholesterolemia, unspecified: Secondary | ICD-10-CM

## 2024-01-05 DIAGNOSIS — I5021 Acute systolic (congestive) heart failure: Secondary | ICD-10-CM

## 2024-01-05 DIAGNOSIS — I214 Non-ST elevation (NSTEMI) myocardial infarction: Secondary | ICD-10-CM

## 2024-01-05 DIAGNOSIS — I2510 Atherosclerotic heart disease of native coronary artery without angina pectoris: Secondary | ICD-10-CM

## 2024-01-20 ENCOUNTER — Other Ambulatory Visit: Payer: Self-pay | Admitting: *Deleted

## 2024-01-20 ENCOUNTER — Telehealth: Payer: Self-pay | Admitting: Cardiology

## 2024-01-20 DIAGNOSIS — E785 Hyperlipidemia, unspecified: Secondary | ICD-10-CM

## 2024-01-20 MED ORDER — REPATHA SURECLICK 140 MG/ML ~~LOC~~ SOAJ: 140.0000 mg | SUBCUTANEOUS | 11 refills | Status: DC

## 2024-01-20 NOTE — Telephone Encounter (Signed)
Follow Up:    Patient says she needs to talk to the nurse again. She still does not understand her lab results.

## 2024-01-20 NOTE — Telephone Encounter (Signed)
Called patient and reviewed the following results.  Kidney function and electrolytes remain stable.  Direct LDL 127 continues to remain elevated.  With continued high intensity rosuvastatin and ezetimibe and would recommend starting Repatha 140 mg subcutaneously via autoinjector every 2 weeks.  Recommend repeating lipid and hepatic in 10 to 12 weeks after initiation of therapy.   Patient verbalizes understanding.

## 2024-01-24 ENCOUNTER — Telehealth: Payer: Self-pay | Admitting: Pharmacy Technician

## 2024-01-24 ENCOUNTER — Other Ambulatory Visit (HOSPITAL_COMMUNITY): Payer: Self-pay

## 2024-01-24 NOTE — Telephone Encounter (Signed)
Pt calling back checking status of Repatha. Pharm advised PA needed. Requesting cb

## 2024-01-24 NOTE — Telephone Encounter (Signed)
Pharmacy Patient Advocate Encounter   Received notification from Pt Calls Messages that prior authorization for Repatha SureClick 140MG /ML auto-injectors is required/requested.   Insurance verification completed.   The patient is insured through [facility] .   Per test claim: PA required; PA submitted to above mentioned insurance via CoverMyMeds Key/confirmation #/EOC [id number] Status is pending

## 2024-01-25 NOTE — Telephone Encounter (Signed)
Called patient and notified her of the following.  Received notification from [facility] that Prior Authorization for Repatha SureClick 140MG /ML auto-injectors  has been APPROVED from 01/24/24 to 07/23/24. Spoke to pharmacy to process.Copay is $0.00.   Patient verbalizes understanding.

## 2024-01-25 NOTE — Telephone Encounter (Signed)
Pharmacy Patient Advocate Encounter  Received notification from [facility] that Prior Authorization for Repatha SureClick 140MG /ML auto-injectors  has been APPROVED from 01/24/24 to 07/23/24. Spoke to pharmacy to process.Copay is $0.00.    PA #/Case ID/Reference #: [id number]

## 2024-01-26 ENCOUNTER — Telehealth: Payer: Self-pay | Admitting: Cardiovascular Disease

## 2024-01-26 DIAGNOSIS — E785 Hyperlipidemia, unspecified: Secondary | ICD-10-CM

## 2024-01-26 NOTE — Telephone Encounter (Signed)
Pt c/o medication issue:  1. Name of Medication:  Repatha   2. How are you currently taking this medication (dosage and times per day)?   3. Are you having a reaction (difficulty breathing--STAT)?   4. What is your medication issue?   Patient states she can only use one hand so she's unable to inject Repatha. She would like to discuss alternatives.

## 2024-01-27 NOTE — Telephone Encounter (Signed)
Called and spoke with patient. Patient states that she would like to try Leqvio. Referral placed.

## 2024-01-30 ENCOUNTER — Encounter: Payer: Self-pay | Admitting: Emergency Medicine

## 2024-01-31 ENCOUNTER — Other Ambulatory Visit (HOSPITAL_COMMUNITY): Payer: Self-pay

## 2024-01-31 NOTE — Progress Notes (Signed)
Average heart rate was 68 bpm.  7 episodes of fast heart rates with a maximum of 162 bpm.  Would recommend increasing Toprol-XL to 37.5 mg daily.  Monitor heart rate and blood pressure 1 to 2 hours postmedication administration, increasing medications will likely help with associated palpitations.

## 2024-02-01 ENCOUNTER — Other Ambulatory Visit: Payer: Self-pay | Admitting: *Deleted

## 2024-02-01 DIAGNOSIS — I5042 Chronic combined systolic (congestive) and diastolic (congestive) heart failure: Secondary | ICD-10-CM

## 2024-02-01 DIAGNOSIS — I255 Ischemic cardiomyopathy: Secondary | ICD-10-CM

## 2024-02-01 DIAGNOSIS — I25118 Atherosclerotic heart disease of native coronary artery with other forms of angina pectoris: Secondary | ICD-10-CM

## 2024-02-01 MED ORDER — METOPROLOL SUCCINATE ER 25 MG PO TB24
37.5000 mg | ORAL_TABLET | Freq: Every day | ORAL | 3 refills | Status: DC
Start: 2024-02-01 — End: 2024-02-03

## 2024-02-02 ENCOUNTER — Other Ambulatory Visit: Payer: Self-pay | Admitting: Cardiology

## 2024-02-02 DIAGNOSIS — I25118 Atherosclerotic heart disease of native coronary artery with other forms of angina pectoris: Secondary | ICD-10-CM

## 2024-02-02 DIAGNOSIS — I5042 Chronic combined systolic (congestive) and diastolic (congestive) heart failure: Secondary | ICD-10-CM

## 2024-02-02 DIAGNOSIS — I255 Ischemic cardiomyopathy: Secondary | ICD-10-CM

## 2024-02-06 ENCOUNTER — Telehealth: Payer: Self-pay | Admitting: Cardiovascular Disease

## 2024-02-06 NOTE — Telephone Encounter (Signed)
 Called patient and left message. Instructed patient to monitor blood pressure 2 hours after taking blood pressure medication. Recommended patient keeping a log of blood pressures. Asked patient to call back if she had any further questions.

## 2024-02-06 NOTE — Telephone Encounter (Signed)
 Patient stated she has been instructed to take her blood pressure since starting her new medication but wants to know when and for how long she should take her blood pressure reading.  Patient stated can leave voice message.

## 2024-02-07 ENCOUNTER — Telehealth: Payer: Self-pay | Admitting: Cardiovascular Disease

## 2024-02-07 NOTE — Telephone Encounter (Signed)
Pt called in stating someone called her yesterday about scheduling her appt for her cholesterol. Please advise.   Pt is already scheduled to see pharmD 03/20/24

## 2024-02-07 NOTE — Telephone Encounter (Signed)
Called patient, advised of PHARMD appointment 03/20/2024 3:30 PM.   Patient verbalized understanding, I did not see any other appointments scheduled.

## 2024-03-03 ENCOUNTER — Other Ambulatory Visit: Payer: Self-pay | Admitting: Cardiovascular Disease

## 2024-03-20 ENCOUNTER — Ambulatory Visit: Payer: 59

## 2024-04-10 NOTE — Progress Notes (Unsigned)
 Cardiology Office Note:  .   Date:  04/11/2024  ID:  Hannah Martinez, DOB 09/17/1956, MRN [id number] PCP: [facility] Providers Cardiologist:  Julien Nordmann, MD    History of Present Illness: Hannah Martinez is a 68 y.o. female with a past medical history of HFrEF, coronary artery disease, ostial LAD with collaterals, ischemic cardiomyopathy, CVA, hyperlipidemia, GERD, cerebral aneurysm status post clipping, who is seen today for follow-up of coronary artery disease.   She underwent cerebral aneurysm repair at [facility] in 1999. In 2003 had progressive left hemiparesis and was found to have a large right middle cerebral artery aneurysm, proximal aneurysm clip with reoperation at [facility] in 04/2002. In 09/2012 she had a lipoma removed from her right frontal bone that was complicated by MRSA infection. She presented to [facility] in 2021 with cough, wheezing, and chest pain. She underwent echo in 02/2020 that showed an EF of 20-25%, mild to moderate MR. [facility] showed chronic occluded ostial LAD with right to left and left to left collaterals, mild disease (30%) to left circumflex. RHC revealed severely elevated filling pressures, moderate pulmonary hypertension, mildly reduced cardiac output. Suspect LAD infarct 2 weeks prior to cath and questioned whether or not the anterior wall is viable. Cough and shortness of breath were attributed to severe volume overload and she was diuresed and started on GDMT. Repeat echocardiogram 03/16/2023 revealed an LVEF of 25-30%, severely decreased function and RWMA, G1 trivial mitral valve regurgitation.    She was last seen in clinic 12/29/2023.  She denies any chest pain, shortness of breath or peripheral edema.  Intermittent headache increase amount of palpitations over the last several weeks.  She was placed on a ZIO XT monitor for 2 weeks to rule out arrhythmia.  She returns clinic today stating that she has been doing well. With the  increase in her Toprol XL her palpitations have resolved. She denies any chest pain, shortness of breath, peripheral edema. She did bring in her Repatha today as she has questions about injecting the medication with the use of one arm. She states that she has been taking her medications as prescribed with the exception of her Repatha. She denies any recent hospitalizations or visits to the emergency department.   ROS: Point review of systems has been reviewed and considered negative except ones are listed in the HPI  Studies Reviewed: Marland Kitchen       Event Monitor (Zio) 01/23/2024 Normal sinus rhythm Patient had a min HR of 48 bpm, max HR of 162 bpm, and avg HR of 68 bpm.    7 Supraventricular Tachycardia runs occurred, the run with the fastest interval lasting 19.8 secs with a max rate of 162 bpm (avg 118 bpm); the run with the fastest interval was also the longest.    Isolated SVEs were rare (<1.0%), SVE Couplets were rare (<1.0%), and SVE Triplets were rare (<1.0%).    Isolated VEs were rare (<1.0%, 7005), VE Couplets were rare (<1.0%, 11), and VE Triplets were rare (<1.0%, 3).  Ventricular Bigeminy and Trigeminy were present.    Patient triggered event (1) associated with normal sinus rhythm  TTE 03/16/23 1. Left ventricular ejection fraction, by estimation, is 25 to 30%. The  left ventricle has severely decreased function. The left ventricle  demonstrates regional wall motion abnormalities (see scoring  diagram/findings for description). Left ventricular  diastolic parameters are consistent with Grade I diastolic dysfunction  (impaired relaxation). There is akinesis of  the left ventricular, entire  anterior wall and anteroseptal wall.   2. Right ventricular systolic function is low normal. The right  ventricular size is normal.   3. The mitral valve is normal in structure. Trivial mitral valve  regurgitation.   4. The aortic valve is tricuspid. Aortic valve regurgitation is not   visualized.   5. The inferior vena cava is normal in size with greater than 50%  respiratory variability, suggesting right atrial pressure of 3 mmHg.    TTE 10/14/2021 1. Left ventricular ejection fraction, by estimation, is 25 to 30%. The  left ventricle has severely decreased function. The left ventricle  demonstrates regional wall motion abnormalities (see scoring  diagram/findings for description). Left ventricular  diastolic parameters are consistent with Grade I diastolic dysfunction  (impaired relaxation). There is akinesis of the left ventricular, entire  septal wall.   2. Right ventricular systolic function is low normal. The right  ventricular size is normal.   3. The mitral valve is normal in structure. No evidence of mitral valve  regurgitation.   4. The aortic valve was not well visualized. Aortic valve regurgitation  is not visualized.   5. The inferior vena cava is normal in size with greater than 50%  respiratory variability, suggesting right atrial pressure of 3 mmHg.    TTE7/28/21 1. Left ventricular ejection fraction, by estimation, is 25 to 30%. The  left ventricle has severely decreased function. The left ventricle  demonstrates regional wall motion abnormalities (see scoring  diagram/findings for description). Left ventricular  diastolic parameters are consistent with Grade I diastolic dysfunction  (impaired relaxation). There is akinesis of the left ventricular, entire  septal wall.   2. Right ventricular systolic function is normal. The right ventricular  size is normal.   3. The mitral valve is normal in structure. No evidence of mitral valve  regurgitation.   4. The aortic valve is normal in structure. Aortic valve regurgitation is  not visualized.   5. The inferior vena cava is normal in size with greater than 50%  respiratory variability, suggesting right atrial pressure of 3 mmHg.    [facility] 02/2020 Ost LAD to Prox LAD lesion is 100% stenosed. Mid  Cx lesion is 30% stenosed. 3rd Mrg lesion is 30% stenosed.   1.  Left dominant coronary arteries with chronically occluded ostial LAD with some right to left and left to left collaterals.  Mild disease affecting the left circumflex. 2.  Right heart catheterization showed severely elevated filling pressures with RA pressure of 10 mmHg, pulmonary capillary wedge pressure of 31 mmHg with prominent V wave suggestive of mitral regurgitation, moderate pulmonary hypertension at 55/31 mmHg and mildly reduced cardiac output at 4.56 with a cardiac index of 2.47.  LVEDP was 34 mmHg.   Recommendations: The patient likely had an LAD infarct 2 weeks ago.  I suspect that the anterior wall is now nonviable.  Unfortunately, she is left with severe ischemic cardiomyopathy and right heart catheterization shows evidence of severe volume overload likely responsible for her symptoms of shortness of breath and dry cough. I am going to start intravenous diuresis with furosemide 40 mg twice daily.  I added small dose carvedilol and losartan.  Recommend small dose spironolactone before hospital discharge and consider switching losartan to Entresto as an outpatient. Recommend aggressive medical therapy for coronary artery disease.   TTE 02/25/20 1. Left ventricular ejection fraction, by estimation, is 20 to 25%. The  left ventricle has severely decreased function. The left  ventricle  demonstrates regional wall motion abnormalities (see scoring  diagram/findings for description). The left  ventricular internal cavity size was mildly dilated. Left ventricular  diastolic parameters are consistent with Grade II diastolic dysfunction  (pseudonormalization). There is akinesis of the left ventricular,  mid-apical anteroseptal wall, anterior segment   and apical segment. Prominent apical trabeculation with no clear apical  thrombus with echo contrast.   2. Right ventricular systolic function is normal. The right ventricular  size  is normal. Tricuspid regurgitation signal is inadequate for assessing  PA pressure.   3. Left atrial size was mildly dilated.   4. The mitral valve is normal in structure and function. Mild to moderate  mitral valve regurgitation. No evidence of mitral stenosis.   5. The aortic valve is normal in structure and function. Aortic valve  regurgitation is not visualized. No aortic stenosis is present.  Risk Assessment/Calculations:             Physical Exam:   VS:  BP 128/80 (BP Location: Right Arm)   Pulse 69   Ht 5\' 5"  (1.651 m)   Wt 193 lb (87.5 kg)   SpO2 98%   BMI 32.12 kg/m    Wt Readings from Last 3 Encounters:  04/11/24 193 lb (87.5 kg)  12/29/23 189 lb 6.4 oz (85.9 kg)  09/15/23 185 lb 12.8 oz (84.3 kg)    GEN: Well nourished, well developed in no acute distress NECK: No JVD; No carotid bruits CARDIAC: RRR, no murmurs, rubs, gallops RESPIRATORY:  Clear to auscultation without rales, wheezing or rhonchi  ABDOMEN: Soft, non-tender, non-distended EXTREMITIES:  No edema; No deformity   ASSESSMENT AND PLAN: .   Palpitations that have improved since increasing her Toprol-XL to 37.5 mg daily.  ZIO XT monitoring revealed an average heart rate of 68 bpm, 7 supraventricular runs with the fastest 162 bpm lasting 19.8 seconds and isolated SVE's and VE's was also occasional ventricular bigeminy and trigeminy that was present.  She had 1 patient triggered events were associated with normal sinus rhythm.  With improvement in her palpitations no further medication changes were made at this time.  Coronary artery disease presently presenting anterior STEMI in 02/2020.  Right and left heart catheterization completed in 3/21 with CTO of the distal LAD and right to left and left to left collaterals with mild disease in the left circumflex 30%.  She was unable to undergo cardiac MRI due to metal from cerebral aneurysm repair.  She denies any recurrent chest discomfort or shortness of breath.  She has  been continued on aspirin 81 mg daily, ezetimibe 10 mg daily rosuvastatin 40 mg daily Repatha 140 mg every 2 weeks subcu.  No further ischemic workup needed at this time  HFrEF/ICM with last LVEF of 25 to 30%, G1 DD, trivial MR.  She denies any shortness of breath or peripheral edema.  She has been continued on GDMT of Jardiance 10 mg daily, furosemide 40 mg daily, Entresto 24/26 mg twice daily 37.5 mg daily and spironolactone 25 mg daily.  He was to be euvolemic on exam today.  NYHA class II symptoms.   Mixed hyperlipidemia with last direct LDL of 127 on 12/29/2023.  She is continued on rosuvastatin, ezetimibe, and Repatha 140 mg every 2 weeks.  She did bring her Repatha in with her today and was educated on the use of the pen and how to actually give herself an injection with the use of only 1 arm.  She is scheduled  for a lipid and hepatic in 3 months after starting her Repatha today.  She also has an appointment in [place] with lipid clinic in the upcoming months.  Mitral regurgitation previously noted on echocardiogram that was mild to moderate.  Last echocardiogram in 2024 revealed trivial MR.  Will continue to monitor with surveillance studies.  Prior CVA with residual deficits on the right side.  She is continued on aspirin 81 mg daily, rosuvastatin 40 mg daily, ezetimibe 10 mg daily, and Repatha 140 mg every 2 weeks injections.       Dispo: Patient to return to clinic to see MD/APP in 3 months or sooner if needed  Signed, Rockie Schnoor, NP

## 2024-04-11 ENCOUNTER — Encounter: Payer: Self-pay | Admitting: Cardiology

## 2024-04-11 ENCOUNTER — Ambulatory Visit: Payer: 59 | Attending: Cardiology | Admitting: Cardiology

## 2024-04-11 VITALS — BP 128/80 | HR 69 | Ht 65.0 in | Wt 193.0 lb

## 2024-04-11 DIAGNOSIS — I255 Ischemic cardiomyopathy: Secondary | ICD-10-CM

## 2024-04-11 DIAGNOSIS — I639 Cerebral infarction, unspecified: Secondary | ICD-10-CM

## 2024-04-11 DIAGNOSIS — I25118 Atherosclerotic heart disease of native coronary artery with other forms of angina pectoris: Secondary | ICD-10-CM

## 2024-04-11 DIAGNOSIS — I340 Nonrheumatic mitral (valve) insufficiency: Secondary | ICD-10-CM

## 2024-04-11 DIAGNOSIS — I5020 Unspecified systolic (congestive) heart failure: Secondary | ICD-10-CM | POA: Diagnosis not present

## 2024-04-11 DIAGNOSIS — R002 Palpitations: Secondary | ICD-10-CM | POA: Diagnosis not present

## 2024-04-11 DIAGNOSIS — E785 Hyperlipidemia, unspecified: Secondary | ICD-10-CM

## 2024-04-11 NOTE — Patient Instructions (Signed)
 Medication Instructions:  Your physician recommends that you continue on your current medications as directed. Please refer to the Current Medication list given to you today.  *If you need a refill on your cardiac medications before your next appointment, please call your pharmacy*  Lab Work: No labs ordered today  If you have labs (blood work) drawn today and your tests are completely normal, you will receive your results only by: MyChart Message (if you have MyChart) OR A paper copy in the mail If you have any lab test that is abnormal or we need to change your treatment, we will call you to review the results.  Testing/Procedures: No test ordered today   Follow-Up: At [facility], you and your health needs are our priority.  As part of our continuing mission to provide you with exceptional heart care, our providers are all part of one team.  This team includes your primary Cardiologist (physician) and Advanced Practice Providers or APPs (Physician Assistants and Nurse Practitioners) who all work together to provide you with the care you need, when you need it.  Your next appointment:   3 month(s)  Provider:   You may see Timothy Gollan, MD or one of the following Advanced Practice Providers on your designated Care Team:   Laneta Pintos, NP Gildardo Labrador, PA-C Varney Gentleman, PA-C Cadence Wilmar, PA-C Ronald Cockayne, NP Morey Ar, NP    We recommend signing up for the patient portal called "MyChart".  Sign up information is provided on this After Visit Summary.  MyChart is used to connect with patients for Virtual Visits (Telemedicine).  Patients are able to view lab/test results, encounter notes, upcoming appointments, etc.  Non-urgent messages can be sent to your provider as well.   To learn more about what you can do with MyChart, go to [email or web address].

## 2024-05-16 ENCOUNTER — Ambulatory Visit: Admitting: Pharmacist Clinician (PhC)/ Clinical Pharmacy Specialist

## 2024-06-04 ENCOUNTER — Ambulatory Visit: Admitting: Pharmacist Clinician (PhC)/ Clinical Pharmacy Specialist

## 2024-06-04 LAB — LIPID PANEL

## 2024-06-04 NOTE — Progress Notes (Unsigned)
 Office Visit    Patient Name: Hannah Martinez Date of Encounter: 06/04/2024  Primary Care Provider:  [facility] Primary Cardiologist:  Belva Boyden, MD  Chief Complaint    Hyperlipidemia   Significant Past Medical History   CAD 3/21 anterior STEMI, dLAD CTO, collaterals  palpitations Improved on metoprolol  succ 37.5  HFrEF EF 25-30% G1DD, trivial MR; GDMT, NYHA class II sx  CVA Residual R deficits (prior to 2010)        Allergies  Allergen Reactions   Lipitor [Atorvastatin ] Hives    History of Present Illness    Hannah Martinez is a 68 y.o. female patient of Dr Jerelene Monday, in the office today to discuss options for cholesterol management.  Insurance Carrier: [employer]  [phone]  Pharmacy:     [facility]:      LDL Cholesterol goal:    Current Medications:     Previously tried:    Family Hx:     Social Hx: Tobacco: Alcohol:      Diet:      Exercise:   Adherence Assessment  Do you ever forget to take your medication? [] Yes [] No  Do you ever skip doses due to side effects? [] Yes [] No  Do you have trouble affording your medicines? [] Yes [] No  Are you ever unable to pick up your medication due to transportation difficulties? [] Yes [] No  Do you ever stop taking your medications because you don't believe they are helping? [] Yes [] No  Do you check your weight daily? [] Yes [] No   Adherence strategy: ***  Barriers to obtaining medications: ***     Accessory Clinical Findings   Lab Results  Component Value Date   CHOL 178 06/17/2020   HDL 54 06/17/2020   LDLCALC 102 (H) 06/17/2020   LDLDIRECT 127 (H) 12/29/2023   TRIG 122 06/17/2020   CHOLHDL 3.3 06/17/2020    No results found for: "LIPOA"  Lab Results  Component Value Date   ALT 17 06/11/2023   AST 17 06/11/2023   ALKPHOS 88 06/11/2023   BILITOT 1.2 06/11/2023   Lab Results  Component Value Date   CREATININE 1.05 (H) 12/29/2023   BUN 12 12/29/2023    NA 143 12/29/2023   K 4.2 12/29/2023   CL 108 (H) 12/29/2023   CO2 22 12/29/2023   Lab Results  Component Value Date   HGBA1C 4.9 02/25/2020    Home Medications    Current Outpatient Medications  Medication Sig Dispense Refill   alendronate (FOSAMAX) 70 MG tablet Take 70 mg by mouth once a week.     benzonatate  (TESSALON ) 100 MG capsule Take 100 mg by mouth 3 (three) times daily as needed for cough.     cetirizine (ZYRTEC) 10 MG tablet as needed for allergies.     [facility] ASPIRIN  ADULT LOW DOSE 81 MG chewable tablet CHEW 1 TABLET (81 MG TOTAL) BY MOUTH DAILY. 90 tablet 2   empagliflozin  (JARDIANCE ) 10 MG TABS tablet Take 1 tablet (10 mg total) by mouth daily before breakfast. 90 tablet 3   Evolocumab  (REPATHA  SURECLICK) 140 MG/ML SOAJ Inject 140 mg into the skin every 14 (fourteen) days. 2 mL 11   ezetimibe  (ZETIA ) 10 MG tablet TAKE ONE TABLET (10MG  TOTAL) BY MOUTH DAILY AT 9AM 90 tablet 0   fluticasone  (FLONASE ) 50 MCG/ACT nasal spray Place 1-2 sprays into both nostrils daily as needed for allergies.     fluticasone -salmeterol (ADVAIR) 500-50 MCG/ACT AEPB Inhale 1 puff into the lungs  in the morning and at bedtime.     furosemide  (LASIX ) 40 MG tablet TAKE 1 TABLET (40 MG TOTAL) BY MOUTH AS DIRECTED.AND EXTRA 1 TABLET AS NEEDED FOR SWELLING 180 tablet 3   gabapentin  (NEURONTIN ) 300 MG capsule Take 300 mg by mouth 3 (three) times daily. (take each capsule with 600mg  tablet to equal 900mg  three times a day)     gabapentin  (NEURONTIN ) 600 MG tablet Take 600 mg by mouth 3 (three) times daily. (take each tablet with 300mg  capsule to equal 900mg  three times a day)     meloxicam (MOBIC) 7.5 MG tablet Take 7.5 mg by mouth daily.     metoprolol  succinate (TOPROL -XL) 25 MG 24 hr tablet TAKE 1.5 TABLETS BY MOUTH DAILY. 135 tablet 0   omeprazole  (PRILOSEC) 20 MG capsule TAKE ONE CAPSULE BY MOUTH DAILY AT 9AM 90 capsule 2   rosuvastatin  (CRESTOR ) 40 MG tablet TAKE 1 TABLET BY MOUTH DAILY 30 tablet 0    sacubitril-valsartan (ENTRESTO ) 24-26 MG Take 1 tablet by mouth 2 (two) times daily. 180 tablet 3   spironolactone  (ALDACTONE ) 25 MG tablet Take 1 tablet (25 mg total) by mouth daily. Office visit needed for further refill. 90 tablet 3   tobramycin-dexamethasone  (TOBRADEX) ophthalmic solution      No current facility-administered medications for this visit.     Assessment & Plan    No problem-specific Assessment & Plan notes found for this encounter.   Thamas Appleyard, PharmD CPP [facility] [address]   [place], [place] [address] [phone]  06/04/2024, 6:39 AM

## 2024-06-05 ENCOUNTER — Ambulatory Visit: Payer: Self-pay | Admitting: Cardiology

## 2024-06-05 ENCOUNTER — Other Ambulatory Visit: Payer: Self-pay | Admitting: Cardiovascular Disease

## 2024-06-05 LAB — LIPID PANEL
Cholesterol, Total: 95 mg/dL — ABNORMAL LOW (ref 100–199)
HDL: 43 mg/dL (ref >39)
LDL CALC COMMENT:: 2.2 ratio (ref 0.0–4.4)
LDL Chol Calc (NIH): 33 mg/dL (ref 0–99)
Triglycerides: 103 mg/dL (ref 0–149)
VLDL Cholesterol Cal: 19 mg/dL (ref 5–40)

## 2024-06-05 LAB — HEPATIC FUNCTION PANEL
ALT: 11 IU/L (ref 0–32)
AST: 13 IU/L (ref 0–40)
Albumin: 4.1 g/dL (ref 3.9–4.9)
Alkaline Phosphatase: 82 IU/L (ref 44–121)
Bilirubin Total: 0.5 mg/dL (ref 0.0–1.2)
Bilirubin, Direct: 0.2 mg/dL (ref 0.00–0.40)
Total Protein: 6.3 g/dL (ref 6.0–8.5)

## 2024-06-05 NOTE — Progress Notes (Signed)
 LDL vastly improved after being on Repatha .  Liver functions have remained normal.  Continue current medication without changes at this time.  Some of your cholesterol medications may be able to be reduced with your return to lipid clinic.

## 2024-07-12 ENCOUNTER — Ambulatory Visit: Attending: Cardiology | Admitting: Cardiology

## 2024-07-12 ENCOUNTER — Encounter: Payer: Self-pay | Admitting: Cardiology

## 2024-07-12 VITALS — BP 100/60 | HR 67 | Ht 65.0 in | Wt 196.1 lb

## 2024-07-12 DIAGNOSIS — R002 Palpitations: Secondary | ICD-10-CM

## 2024-07-12 DIAGNOSIS — I5020 Unspecified systolic (congestive) heart failure: Secondary | ICD-10-CM

## 2024-07-12 DIAGNOSIS — I25118 Atherosclerotic heart disease of native coronary artery with other forms of angina pectoris: Secondary | ICD-10-CM | POA: Diagnosis not present

## 2024-07-12 DIAGNOSIS — Z79899 Other long term (current) drug therapy: Secondary | ICD-10-CM

## 2024-07-12 DIAGNOSIS — E785 Hyperlipidemia, unspecified: Secondary | ICD-10-CM

## 2024-07-12 DIAGNOSIS — I255 Ischemic cardiomyopathy: Secondary | ICD-10-CM

## 2024-07-12 DIAGNOSIS — R0602 Shortness of breath: Secondary | ICD-10-CM

## 2024-07-12 DIAGNOSIS — I639 Cerebral infarction, unspecified: Secondary | ICD-10-CM

## 2024-07-12 DIAGNOSIS — I340 Nonrheumatic mitral (valve) insufficiency: Secondary | ICD-10-CM

## 2024-07-12 MED ORDER — METOPROLOL SUCCINATE ER 25 MG PO TB24: 37.5000 mg | ORAL_TABLET | Freq: Every day | ORAL | 3 refills | Status: DC

## 2024-07-12 MED ORDER — EZETIMIBE 10 MG PO TABS: 10.0000 mg | ORAL_TABLET | Freq: Every day | ORAL | 3 refills | Status: DC

## 2024-07-12 MED ORDER — REPATHA SURECLICK 140 MG/ML ~~LOC~~ SOAJ: 140.0000 mg | SUBCUTANEOUS | 3 refills | Status: DC

## 2024-07-12 NOTE — Progress Notes (Signed)
 Cardiology Office Note   Date:  07/12/2024  ID:  Hannah Martinez, DOB 1956-07-26, MRN [id number] PCP: [facility]  [facility] Providers Cardiologist:  Evalene Lunger, MD     History of Present Illness Hannah Martinez is a 68 y.o. female with a past medical history elevated HFrEF/coronary artery disease, ostial LAD with collaterals, ischemic cardiomyopathy, CVA, hyperlipidemia, GERD, cerebral aneurysm status post clipping, who is here today for follow-up of coronary artery disease.   She underwent cerebral aneurysm repair at [facility] in 1999. In 2003 had progressive left hemiparesis and was found to have a large right middle cerebral artery aneurysm, proximal aneurysm clip with reoperation at [facility] in 04/2002. In 09/2012 she had a lipoma removed from her right frontal bone that was complicated by MRSA infection. She presented to [facility] in 2021 with cough, wheezing, and chest pain. She underwent echo in 02/2020 that showed an EF of 20-25%, mild to moderate MR. [facility] showed chronic occluded ostial LAD with right to left and left to left collaterals, mild disease (30%) to left circumflex. RHC revealed severely elevated filling pressures, moderate pulmonary hypertension, mildly reduced cardiac output. Suspect LAD infarct 2 weeks prior to cath and questioned whether or not the anterior wall is viable. Cough and shortness of breath were attributed to severe volume overload and she was diuresed and started on GDMT. Repeat echocardiogram 03/16/2023 revealed an LVEF of 25-30%, severely decreased function and RWMA, G1 trivial mitral valve regurgitation.    She was last seen in clinic April 11, 2024 states she been doing well.  She previously wore a ZIO XT monitor for 2 weeks to rule out arrhythmia and increased her Toprol  after that time.  On return visit she stated that her palpitations had resolved.  She denies any recent hospitalizations or visits to the emergency department.  There  were no medication changes that were made and no further testing that was ordered at that time.  She returns clinic today stating overall from a cardiac perspective she has been doing well.  She has noted over the last 2 weeks an increase in swelling to her lower extremities primarily around her feet and ankles causing discoloration to the skin in her lower extremities.  She has noted with the heat and the humidity and increased amount of shortness of breath as she has increased her fluid intake.  She states that she has been compliant with her current medication regimen without any undue side effects.  She is excited today that her Repatha  has reduced her cholesterol to below goal.  She denies any hospitalizations or visits to the emergency department.  ROS: 10 point review of systems has been reviewed and considered negative except ones been listed in the HPI  Studies Reviewed EKG Interpretation Date/Time:  Thursday July 12 2024 08:05:14 EDT Ventricular Rate:  67 PR Interval:  172 QRS Duration:  70 QT Interval:  408 QTC Calculation: 431 R Axis:   -27  Text Interpretation: Normal sinus rhythm Minimal voltage criteria for LVH, may be normal variant ( R in aVL ) Septal infarct (cited on or before 24-Feb-2020) T wave abnormality, consider anterior ischemia When compared with ECG of 18-Aug-2023 07:55, No significant change since last tracing Confirmed by Gerard Frederick ([id number]) on 07/12/2024 8:08:44 AM    Event Monitor (Zio) 01/23/2024 Normal sinus rhythm Patient had a min HR of 48 bpm, max HR of 162 bpm, and avg HR of 68 bpm.    7 Supraventricular Tachycardia runs occurred,  the run with the fastest interval lasting 19.8 secs with a max rate of 162 bpm (avg 118 bpm); the run with the fastest interval was also the longest.    Isolated SVEs were rare (<1.0%), SVE Couplets were rare (<1.0%), and SVE Triplets were rare (<1.0%).    Isolated VEs were rare (<1.0%, 7005), VE Couplets were rare (<1.0%,  11), and VE Triplets were rare (<1.0%, 3).  Ventricular Bigeminy and Trigeminy were present.    Patient triggered event (1) associated with normal sinus rhythm   TTE 03/16/23 1. Left ventricular ejection fraction, by estimation, is 25 to 30%. The  left ventricle has severely decreased function. The left ventricle  demonstrates regional wall motion abnormalities (see scoring  diagram/findings for description). Left ventricular  diastolic parameters are consistent with Grade I diastolic dysfunction  (impaired relaxation). There is akinesis of the left ventricular, entire  anterior wall and anteroseptal wall.   2. Right ventricular systolic function is low normal. The right  ventricular size is normal.   3. The mitral valve is normal in structure. Trivial mitral valve  regurgitation.   4. The aortic valve is tricuspid. Aortic valve regurgitation is not  visualized.   5. The inferior vena cava is normal in size with greater than 50%  respiratory variability, suggesting right atrial pressure of 3 mmHg.    TTE 10/14/2021 1. Left ventricular ejection fraction, by estimation, is 25 to 30%. The  left ventricle has severely decreased function. The left ventricle  demonstrates regional wall motion abnormalities (see scoring  diagram/findings for description). Left ventricular  diastolic parameters are consistent with Grade I diastolic dysfunction  (impaired relaxation). There is akinesis of the left ventricular, entire  septal wall.   2. Right ventricular systolic function is low normal. The right  ventricular size is normal.   3. The mitral valve is normal in structure. No evidence of mitral valve  regurgitation.   4. The aortic valve was not well visualized. Aortic valve regurgitation  is not visualized.   5. The inferior vena cava is normal in size with greater than 50%  respiratory variability, suggesting right atrial pressure of 3 mmHg.    TTE7/28/21 1. Left ventricular ejection  fraction, by estimation, is 25 to 30%. The  left ventricle has severely decreased function. The left ventricle  demonstrates regional wall motion abnormalities (see scoring  diagram/findings for description). Left ventricular  diastolic parameters are consistent with Grade I diastolic dysfunction  (impaired relaxation). There is akinesis of the left ventricular, entire  septal wall.   2. Right ventricular systolic function is normal. The right ventricular  size is normal.   3. The mitral valve is normal in structure. No evidence of mitral valve  regurgitation.   4. The aortic valve is normal in structure. Aortic valve regurgitation is  not visualized.   5. The inferior vena cava is normal in size with greater than 50%  respiratory variability, suggesting right atrial pressure of 3 mmHg.    [facility] 02/2020 Ost LAD to Prox LAD lesion is 100% stenosed. Mid Cx lesion is 30% stenosed. 3rd Mrg lesion is 30% stenosed.   1.  Left dominant coronary arteries with chronically occluded ostial LAD with some right to left and left to left collaterals.  Mild disease affecting the left circumflex. 2.  Right heart catheterization showed severely elevated filling pressures with RA pressure of 10 mmHg, pulmonary capillary wedge pressure of 31 mmHg with prominent V wave suggestive of mitral regurgitation, moderate pulmonary hypertension  at 55/31 mmHg and mildly reduced cardiac output at 4.56 with a cardiac index of 2.47.  LVEDP was 34 mmHg.   Recommendations: The patient likely had an LAD infarct 2 weeks ago.  I suspect that the anterior wall is now nonviable.  Unfortunately, she is left with severe ischemic cardiomyopathy and right heart catheterization shows evidence of severe volume overload likely responsible for her symptoms of shortness of breath and dry cough. I am going to start intravenous diuresis with furosemide  40 mg twice daily.  I added small dose carvedilol  and losartan .  Recommend small dose  spironolactone  before hospital discharge and consider switching losartan  to Entresto  as an outpatient. Recommend aggressive medical therapy for coronary artery disease.   TTE 02/25/20 1. Left ventricular ejection fraction, by estimation, is 20 to 25%. The  left ventricle has severely decreased function. The left ventricle  demonstrates regional wall motion abnormalities (see scoring  diagram/findings for description). The left  ventricular internal cavity size was mildly dilated. Left ventricular  diastolic parameters are consistent with Grade II diastolic dysfunction  (pseudonormalization). There is akinesis of the left ventricular,  mid-apical anteroseptal wall, anterior segment   and apical segment. Prominent apical trabeculation with no clear apical  thrombus with echo contrast.   2. Right ventricular systolic function is normal. The right ventricular  size is normal. Tricuspid regurgitation signal is inadequate for assessing  PA pressure.   3. Left atrial size was mildly dilated.   4. The mitral valve is normal in structure and function. Mild to moderate  mitral valve regurgitation. No evidence of mitral stenosis.   5. The aortic valve is normal in structure and function. Aortic valve  regurgitation is not visualized. No aortic stenosis is present.  Risk Assessment/Calculations           Physical Exam VS:  BP 100/60 (BP Location: Right Arm, Patient Position: Sitting, Cuff Size: Normal)   Pulse 67   Ht 5' 5 (1.651 m)   Wt 196 lb 2 oz (89 kg)   SpO2 98%   BMI 32.64 kg/m        Wt Readings from Last 3 Encounters:  07/12/24 196 lb 2 oz (89 kg)  04/11/24 193 lb (87.5 kg)  12/29/23 189 lb 6.4 oz (85.9 kg)    GEN: Well nourished, well developed in no acute distress NECK: No JVD; No carotid bruits CARDIAC: RRR, no murmurs, rubs, gallops RESPIRATORY:  Clear to auscultation without rales, wheezing or rhonchi  ABDOMEN: Soft, non-tender, non-distended EXTREMITIES: Trace  pretibial edema; No deformity, venous discoloration to the bilateral lower extremities at the ankles  ASSESSMENT AND PLAN Coronary artery disease previously presented with anterior STEMI in 02/2020.  Right and left heart catheterization was completed in 02/2020 with CTO of the distal LAD and right to left and left to left collaterals with mild disease in the left circumflex revealing 30% stenosis.  She was unable to undergo cardiac MRI due to metal from cerebral aneurysm repair.  She continues to deny any chest discomfort.  EKG today reveals sinus rhythm with a rate of 67, left axis deviation, LVH, T wave abnormality with no significant change from prior studies.  She is continued on aspirin  81 mg daily, ezetimibe  10 mg daily, rosuvastatin  40 mg daily and Repatha  140 mg every 2 weeks.  No further ischemic workup is needed at this time.  We did discuss de-escalation of statin therapy and patient would prefer to stay on her current regimen at this time.  HFrEF/ICM  with last LVEF of 25 to 30%, G1 DD, trivial MR.  She has started with shortness of breath and dyspnea on exertion has been scheduled for an updated echocardiogram.  She has been continued on GDMT of Jardiance  10 mg daily, furosemide  40 mg daily, Toprol -XL 37.5 mg daily, Entresto  24/26 mg twice daily, and spironolactone  25 mg daily.  She has some mild swelling to her bilateral lower extremities and is suffering from NYHA class II symptoms.  She has been sent for updated CBC and a BMP today to rule out anemia and electrolyte abnormalities as she had also had slight bump in her serum creatinine on last labs.  She was unable to undergo cardiac MRI due to coiling from a cerebral aneurysm.  Will repeat echocardiogram and discussed referral to advanced heart failure clinic as well.  Patient would like to wait until her study has returned.  She has been advised to weigh herself daily and for weight increase of 2 to 3 pounds over night from 1 morning to the next  that she may need an additional furosemide .  Mixed hyperlipidemia with last LDL 33.  She has been continued on rosuvastatin , ezetimibe , and Repatha .  She continues to follow with lipid clinic in [place].  Mitral regurgitation previously noted on echocardiogram that was mild to moderate.  She has upcoming echocardiogram scheduled for reevaluation.  Prior CVA with residual deficits she is continue aspirin  81 mg daily, rosuvastatin , ezetimibe , and Repatha .  Palpitations where she previously wore a ZIO XT monitor revealed an average heart rate of 60 bpm with 7 supraventricular runs the fastest 162 bpm lasting 19.8 seconds.  Toprol -XL was increased to 37.5 mg daily since that time palpitations have been resolved.       Dispo: Patient to return to clinic to see MD/APP in 6 weeks or sooner if needed for further evaluation.  Signed, Ishia Tenorio, NP

## 2024-07-12 NOTE — Patient Instructions (Addendum)
 Please weigh daily in the AM, if you have gained 2 lbs. Take Lasix    Medication Instructions:  Your physician recommends that you continue on your current medications as directed. Please refer to the Current Medication list given to you today.   *If you need a refill on your cardiac medications before your next appointment, please call your pharmacy*  Lab Work: Your provider would like for you to have following labs drawn today CBC, BMP.   If you have labs (blood work) drawn today and your tests are completely normal, you will receive your results only by: MyChart Message (if you have MyChart) OR A paper copy in the mail If you have any lab test that is abnormal or we need to change your treatment, we will call you to review the results.  Testing/Procedures: Your physician has requested that you have an echocardiogram. Echocardiography is a painless test that uses sound waves to create images of your heart. It provides your doctor with information about the size and shape of your heart and how well your heart's chambers and valves are working.   You may receive an ultrasound enhancing agent through an IV if needed to better visualize your heart during the echo. This procedure takes approximately one hour.  There are no restrictions for this procedure.  This will take place at [address] [facility] [address]) [address], [place] [address]  Please note: We ask at that you not bring children with you during ultrasound (echo/ vascular) testing. Due to room size and safety concerns, children are not allowed in the ultrasound rooms during exams. Our front office staff cannot provide observation of children in our lobby area while testing is being conducted. An adult accompanying a patient to their appointment will only be allowed in the ultrasound room at the discretion of the ultrasound technician under special circumstances. We apologize for any inconvenience.   Follow-Up: At [facility], you and your health needs are our priority.  As part of our continuing mission to provide you with exceptional heart care, our providers are all part of one team.  This team includes your primary Cardiologist (physician) and Advanced Practice Providers or APPs (Physician Assistants and Nurse Practitioners) who all work together to provide you with the care you need, when you need it.  Your next appointment:   6 week(s)  Provider:   Evalene Lunger, MD or Tylene Lunch, NP    We recommend signing up for the patient portal called MyChart.  Sign up information is provided on this After Visit Summary.  MyChart is used to connect with patients for Virtual Visits (Telemedicine).  Patients are able to view lab/test results, encounter notes, upcoming appointments, etc.  Non-urgent messages can be sent to your provider as well.   To learn more about what you can do with MyChart, go to [email or web address].

## 2024-07-13 ENCOUNTER — Ambulatory Visit: Payer: Self-pay | Admitting: Medical

## 2024-07-13 LAB — BASIC METABOLIC PANEL WITH GFR
BUN/Creatinine Ratio: 13 (ref 12–28)
BUN: 13 mg/dL (ref 8–27)
CO2: 19 mmol/L — ABNORMAL LOW (ref 20–29)
Calcium: 9.2 mg/dL (ref 8.7–10.3)
Chloride: 108 mmol/L — ABNORMAL HIGH (ref 96–106)
Creatinine, Ser: 0.97 mg/dL (ref 0.57–1.00)
Glucose: 105 mg/dL — ABNORMAL HIGH (ref 70–99)
Potassium: 4.1 mmol/L (ref 3.5–5.2)
Sodium: 142 mmol/L (ref 134–144)
eGFR: 64 mL/min/1.73 (ref >59)

## 2024-07-13 LAB — CBC
Hematocrit: 40.4 % (ref 34.0–46.6)
Hemoglobin: 13.1 g/dL (ref 11.1–15.9)
MCH: 33.1 pg — ABNORMAL HIGH (ref 26.6–33.0)
MCHC: 32.4 g/dL (ref 31.5–35.7)
MCV: 102 fL — ABNORMAL HIGH (ref 79–97)
Platelets: 234 x10E3/uL (ref 150–450)
RBC: 3.96 x10E6/uL (ref 3.77–5.28)
RDW: 11.4 % — ABNORMAL LOW (ref 11.7–15.4)
WBC: 4.7 x10E3/uL (ref 3.4–10.8)

## 2024-08-23 ENCOUNTER — Ambulatory Visit: Attending: Cardiology

## 2024-08-23 DIAGNOSIS — R0602 Shortness of breath: Secondary | ICD-10-CM

## 2024-08-23 LAB — ECHOCARDIOGRAM COMPLETE
AR max vel: 2.45 cm2
AV Area VTI: 2.25 cm2
AV Area mean vel: 2.34 cm2
AV Mean grad: 2 mmHg
AV Peak grad: 4.2 mmHg
Ao pk vel: 1.03 m/s
Area-P 1/2: 3.6 cm2

## 2024-08-23 MED ORDER — PERFLUTREN LIPID MICROSPHERE
1.0000 mL | INTRAVENOUS | Status: AC | PRN
  Administered 2024-08-23: 3 mL via INTRAVENOUS

## 2024-08-23 NOTE — Progress Notes (Signed)
 Heart squeeze remains reduced to 20-25%.  No significant change from prior study.  Recommend referral to advanced heart failure clinic.  Continue current medication regimen without changes at this time.

## 2024-08-24 ENCOUNTER — Ambulatory Visit: Attending: Cardiology | Admitting: Cardiology

## 2024-08-24 ENCOUNTER — Encounter: Payer: Self-pay | Admitting: Cardiology

## 2024-08-24 VITALS — BP 129/74 | HR 66 | Ht 65.0 in | Wt 195.4 lb

## 2024-08-24 DIAGNOSIS — I25118 Atherosclerotic heart disease of native coronary artery with other forms of angina pectoris: Secondary | ICD-10-CM | POA: Diagnosis not present

## 2024-08-24 DIAGNOSIS — I639 Cerebral infarction, unspecified: Secondary | ICD-10-CM

## 2024-08-24 DIAGNOSIS — I5020 Unspecified systolic (congestive) heart failure: Secondary | ICD-10-CM

## 2024-08-24 DIAGNOSIS — E785 Hyperlipidemia, unspecified: Secondary | ICD-10-CM

## 2024-08-24 DIAGNOSIS — I340 Nonrheumatic mitral (valve) insufficiency: Secondary | ICD-10-CM

## 2024-08-24 DIAGNOSIS — I255 Ischemic cardiomyopathy: Secondary | ICD-10-CM | POA: Diagnosis not present

## 2024-08-24 DIAGNOSIS — R002 Palpitations: Secondary | ICD-10-CM

## 2024-08-24 NOTE — Patient Instructions (Signed)
 Medication Instructions:  No changes at this time.   *If you need a refill on your cardiac medications before your next appointment, please call your pharmacy*  Lab Work: None  If you have labs (blood work) drawn today and your tests are completely normal, you will receive your results only by: MyChart Message (if you have MyChart) OR A paper copy in the mail If you have any lab test that is abnormal or we need to change your treatment, we will call you to review the results.  Testing/Procedures: None  Follow-Up: At [facility], you and your health needs are our priority.  As part of our continuing mission to provide you with exceptional heart care, our providers are all part of one team.  This team includes your primary Cardiologist (physician) and Advanced Practice Providers or APPs (Physician Assistants and Nurse Practitioners) who all work together to provide you with the care you need, when you need it.  Your next appointment:   2 month(s)  Provider:   Timothy Gollan, MD or Tylene Lunch, NP    Other Instructions Referral to Advanced Heart Failure Clinic.

## 2024-08-24 NOTE — Progress Notes (Signed)
 Cardiology Office Note   Date:  08/24/2024  ID:  Hannah Martinez, DOB 10-20-1956, MRN [id number] PCP: [facility] Providers Cardiologist:  Evalene Lunger, MD Cardiology APP:  Gerard Frederick, NP     History of Present Illness Hannah Martinez is a 68 y.o. female with a past medical history of coronary artery disease with chronically occluded ostial LAD with collaterals, ischemic cardiomyopathy, chronic HFrEF, CVA, hyperlipidemia, GERD, cerebral aneurysm status post clipping, who is here today for follow-up of her coronary artery disease and HFrEF.   She underwent cerebral aneurysm repair at [facility] in 1999. In 2003 had progressive left hemiparesis and was found to have a large right middle cerebral artery aneurysm, proximal aneurysm clip with reoperation at [facility] in 04/2002. In 09/2012 she had a lipoma removed from her right frontal bone that was complicated by MRSA infection. She presented to [facility] in 2021 with cough, wheezing, and chest pain. She underwent echo in 02/2020 that showed an EF of 20-25%, mild to moderate MR. [facility] showed chronic occluded ostial LAD with right to left and left to left collaterals, mild disease (30%) to left circumflex. RHC revealed severely elevated filling pressures, moderate pulmonary hypertension, mildly reduced cardiac output. Suspect LAD infarct 2 weeks prior to cath and questioned whether or not the anterior wall is viable. Cough and shortness of breath were attributed to severe volume overload and she was diuresed and started on GDMT. Repeat echocardiogram 03/16/2023 revealed an LVEF of 25-30%, severely decreased function and RWMA, G1 trivial mitral valve regurgitation.   She was seen in clinic 04/11/24 for CHF been doing well.  She previously wore a ZIO XT monitor for 2 weeks to rule out arrhythmia and increased her Toprol  at that time.  At return visit her palpitations have resolved.   Patient was last seen in clinic  07/12/2024.  Overall she was doing well with a cardiac perspective.  She did have a past 2 weeks of increasing swelling of lower extremities primarily around her feet and ankles causing discoloration of the skin of her lower extremities.  She notes that the edema and increased amount of shortness of breath.  She was excited that her Repatha  had reduced her cholesterol to below goal.  She states she been compliant with her current medication and has not had any hospitalizations.  She returns clinic today stating overall for the cardiac perspective she has been doing well.  She continues to notice lower extremity swelling and worsening shortness of breath on exertion.  She states that she has taken an additional dose of her furosemide  on occasion.  Denies any chest pain, palpitations, lightheadedness or dizziness.  States she has been compliant with her current medication regimen without any undue side effects.  Denies any hospitalizations or visits to the emergency department.  ROS: 10 point review of systems has been reviewed and considered negative except ones are listed in the HPI  Studies Reviewed     2D echo 08/23/2024 1. Left ventricular ejection fraction, by estimation, is 20 to 25%. The  left ventricle has severely decreased function. The left ventricle  demonstrates regional wall motion abnormalities (see scoring  diagram/findings for description). Left ventricular  diastolic parameters are consistent with Grade I diastolic dysfunction  (impaired relaxation). No LV thrombus seen.   2. Right ventricular systolic function is normal. The right ventricular  size is normal.   Event Monitor (Zio) 01/23/2024 Normal sinus rhythm Patient had a min HR of 48 bpm, max  HR of 162 bpm, and avg HR of 68 bpm.    7 Supraventricular Tachycardia runs occurred, the run with the fastest interval lasting 19.8 secs with a max rate of 162 bpm (avg 118 bpm); the run with the fastest interval was also the longest.     Isolated SVEs were rare (<1.0%), SVE Couplets were rare (<1.0%), and SVE Triplets were rare (<1.0%).    Isolated VEs were rare (<1.0%, 7005), VE Couplets were rare (<1.0%, 11), and VE Triplets were rare (<1.0%, 3).  Ventricular Bigeminy and Trigeminy were present.    Patient triggered event (1) associated with normal sinus rhythm   TTE 03/16/23 1. Left ventricular ejection fraction, by estimation, is 25 to 30%. The  left ventricle has severely decreased function. The left ventricle  demonstrates regional wall motion abnormalities (see scoring  diagram/findings for description). Left ventricular  diastolic parameters are consistent with Grade I diastolic dysfunction  (impaired relaxation). There is akinesis of the left ventricular, entire  anterior wall and anteroseptal wall.   2. Right ventricular systolic function is low normal. The right  ventricular size is normal.   3. The mitral valve is normal in structure. Trivial mitral valve  regurgitation.   4. The aortic valve is tricuspid. Aortic valve regurgitation is not  visualized.   5. The inferior vena cava is normal in size with greater than 50%  respiratory variability, suggesting right atrial pressure of 3 mmHg.    TTE 10/14/2021 1. Left ventricular ejection fraction, by estimation, is 25 to 30%. The  left ventricle has severely decreased function. The left ventricle  demonstrates regional wall motion abnormalities (see scoring  diagram/findings for description). Left ventricular  diastolic parameters are consistent with Grade I diastolic dysfunction  (impaired relaxation). There is akinesis of the left ventricular, entire  septal wall.   2. Right ventricular systolic function is low normal. The right  ventricular size is normal.   3. The mitral valve is normal in structure. No evidence of mitral valve  regurgitation.   4. The aortic valve was not well visualized. Aortic valve regurgitation  is not visualized.   5. The  inferior vena cava is normal in size with greater than 50%  respiratory variability, suggesting right atrial pressure of 3 mmHg.    TTE7/28/21 1. Left ventricular ejection fraction, by estimation, is 25 to 30%. The  left ventricle has severely decreased function. The left ventricle  demonstrates regional wall motion abnormalities (see scoring  diagram/findings for description). Left ventricular  diastolic parameters are consistent with Grade I diastolic dysfunction  (impaired relaxation). There is akinesis of the left ventricular, entire  septal wall.   2. Right ventricular systolic function is normal. The right ventricular  size is normal.   3. The mitral valve is normal in structure. No evidence of mitral valve  regurgitation.   4. The aortic valve is normal in structure. Aortic valve regurgitation is  not visualized.   5. The inferior vena cava is normal in size with greater than 50%  respiratory variability, suggesting right atrial pressure of 3 mmHg.    [facility] 02/2020 Ost LAD to Prox LAD lesion is 100% stenosed. Mid Cx lesion is 30% stenosed. 3rd Mrg lesion is 30% stenosed.   1.  Left dominant coronary arteries with chronically occluded ostial LAD with some right to left and left to left collaterals.  Mild disease affecting the left circumflex. 2.  Right heart catheterization showed severely elevated filling pressures with RA pressure of 10 mmHg,  pulmonary capillary wedge pressure of 31 mmHg with prominent V wave suggestive of mitral regurgitation, moderate pulmonary hypertension at 55/31 mmHg and mildly reduced cardiac output at 4.56 with a cardiac index of 2.47.  LVEDP was 34 mmHg.   Recommendations: The patient likely had an LAD infarct 2 weeks ago.  I suspect that the anterior wall is now nonviable.  Unfortunately, she is left with severe ischemic cardiomyopathy and right heart catheterization shows evidence of severe volume overload likely responsible for her symptoms of  shortness of breath and dry cough. I am going to start intravenous diuresis with furosemide  40 mg twice daily.  I added small dose carvedilol  and losartan .  Recommend small dose spironolactone  before hospital discharge and consider switching losartan  to Entresto  as an outpatient. Recommend aggressive medical therapy for coronary artery disease.   TTE 02/25/20 1. Left ventricular ejection fraction, by estimation, is 20 to 25%. The  left ventricle has severely decreased function. The left ventricle  demonstrates regional wall motion abnormalities (see scoring  diagram/findings for description). The left  ventricular internal cavity size was mildly dilated. Left ventricular  diastolic parameters are consistent with Grade II diastolic dysfunction  (pseudonormalization). There is akinesis of the left ventricular,  mid-apical anteroseptal wall, anterior segment   and apical segment. Prominent apical trabeculation with no clear apical  thrombus with echo contrast.   2. Right ventricular systolic function is normal. The right ventricular  size is normal. Tricuspid regurgitation signal is inadequate for assessing  PA pressure.   3. Left atrial size was mildly dilated.   4. The mitral valve is normal in structure and function. Mild to moderate  mitral valve regurgitation. No evidence of mitral stenosis.   5. The aortic valve is normal in structure and function. Aortic valve  regurgitation is not visualized. No aortic stenosis is present.   Risk Assessment/Calculations           Physical Exam VS:  BP 129/74   Pulse 66   Ht 5' 5 (1.651 m)   Wt 195 lb 6.4 oz (88.6 kg)   SpO2 98%   BMI 32.52 kg/m        Wt Readings from Last 3 Encounters:  08/24/24 195 lb 6.4 oz (88.6 kg)  07/12/24 196 lb 2 oz (89 kg)  04/11/24 193 lb (87.5 kg)    GEN: Well nourished, well developed in no acute distress NECK: No JVD; No carotid bruits CARDIAC: RRR, no murmurs, rubs, gallops RESPIRATORY:  Clear to  auscultation without rales, wheezing or rhonchi  ABDOMEN: Soft, non-tender, non-distended EXTREMITIES: Trace pretibial edema; No deformity   ASSESSMENT AND PLAN Coronary artery disease previously presented with anterior STEMI in 02/2020.  Right and left heart catheterization completed 02/2011 CTO of the distal LAD and right to left and left to left collaterals with mild disease in the left circumflex revealing 30% stenosis.  She is unable to undergo cardiac MRI due to metal from cerebral aneurysm repair.  She continues to deny any chest pain or chest discomfort.  She is continued on aspirin  81 mg daily, ezetimibe  10 mg daily, rosuvastatin  40 mg daily, Repatha  140 mg every 2 weeks.  No further ischemic workup is needed at this time.  HFrEF/ICM with her most recent echocardiogram revealing a drop in EF from 20-25%.  She has been continued on GDMT of Jardiance  10 mg daily, furosemide  40 mg daily, Toprol -XL 37.5 mg daily Entresto  24/26 mg twice daily and spironolactone  25 mg daily.  With some mild bilateral  lower extremity swelling and occasional shortness of breath she has been advised that for her weight gain she can take an additional furosemide .  She continues to suffer from NYHA class II symptoms.  She is unable to undergo cardiac MRI due to coiling from a cerebral aneurysm.  Her recent echocardiogram revealed slight drop in her LVEF.  With GDMT and continued drop in LVEF she has been referred to advanced heart failure clinic for further evaluation.  Mixed hyperlipidemia with last LDL 33.  She is continued on current medication regimen.  She no longer follows with lipid clinic in [place].   Mitral regurgitation previously noted on echocardiogram that was mild to moderate and now trivial on most recent echocardiogram.  Will continue to monitor with surveillance studies.  Prior CVA with ambulatory residual deficits.  She is continued on aspirin  and statin therapy.  Palpitations which have now resolved  since her Toprol  XL was increased to 37.5 mg daily.  She is continued on current medication regimen without changes at this time.       Dispo: Patient to return to clinic to see MD/APP in 2 to 3 months or sooner if needed for further evaluation.  Signed, Jashanti Clinkscale, NP

## 2024-08-28 ENCOUNTER — Telehealth: Payer: Self-pay | Admitting: Cardiology

## 2024-08-28 NOTE — Telephone Encounter (Deleted)
 Called to confirm/remind patient of their appointment at the Advanced Heart Failure Clinic on 08/29/24.   Appointment:   [] Confirmed  [x] Left mess   [] No answer/No voice mail  [] VM Full/unable to leave message  [] Phone not in service  Patient reminded to bring all medications and/or complete list.  Confirmed patient has transportation. Gave directions, instructed to utilize valet parking.

## 2024-08-28 NOTE — Telephone Encounter (Signed)
 Called to confirm/remind patient of their appointment at the Advanced Heart Failure Clinic on 08/29/24.   Appointment:   [] Confirmed  [] Left mess   [] No answer/No voice mail  [x] VM Full/unable to leave message  [] Phone not in service  Patient reminded to bring all medications and/or complete list.  Confirmed patient has transportation. Gave directions, instructed to utilize valet parking.

## 2024-08-29 ENCOUNTER — Ambulatory Visit: Attending: Cardiology | Admitting: Cardiology

## 2024-08-29 VITALS — BP 128/81 | HR 72 | Wt 192.0 lb

## 2024-08-29 DIAGNOSIS — Z7984 Long term (current) use of oral hypoglycemic drugs: Secondary | ICD-10-CM | POA: Insufficient documentation

## 2024-08-29 DIAGNOSIS — E785 Hyperlipidemia, unspecified: Secondary | ICD-10-CM | POA: Diagnosis not present

## 2024-08-29 DIAGNOSIS — I5022 Chronic systolic (congestive) heart failure: Secondary | ICD-10-CM | POA: Insufficient documentation

## 2024-08-29 DIAGNOSIS — I255 Ischemic cardiomyopathy: Secondary | ICD-10-CM | POA: Insufficient documentation

## 2024-08-29 DIAGNOSIS — Z79899 Other long term (current) drug therapy: Secondary | ICD-10-CM | POA: Diagnosis not present

## 2024-08-29 DIAGNOSIS — I2510 Atherosclerotic heart disease of native coronary artery without angina pectoris: Secondary | ICD-10-CM | POA: Insufficient documentation

## 2024-08-29 DIAGNOSIS — I5020 Unspecified systolic (congestive) heart failure: Secondary | ICD-10-CM | POA: Diagnosis not present

## 2024-08-29 DIAGNOSIS — Z7982 Long term (current) use of aspirin: Secondary | ICD-10-CM | POA: Insufficient documentation

## 2024-08-29 DIAGNOSIS — Z8679 Personal history of other diseases of the circulatory system: Secondary | ICD-10-CM | POA: Diagnosis not present

## 2024-08-29 MED ORDER — SACUBITRIL-VALSARTAN 49-51 MG PO TABS: 1.0000 | ORAL_TABLET | Freq: Two times a day (BID) | ORAL | 6 refills | Status: AC

## 2024-08-29 MED ORDER — FUROSEMIDE 40 MG PO TABS: 40.0000 mg | ORAL_TABLET | Freq: Every day | ORAL | 3 refills | Status: DC

## 2024-08-29 NOTE — Progress Notes (Unsigned)
   ADVANCED HEART FAILURE NEW PATIENT CLINIC NOTE  Referring Physician: [facility]  Primary Care: [facility] Primary Cardiologist:  HPI: Hannah Martinez is a 68 y.o. female with a PMH of chronic systolic heart failure, prior cerebral aneurysms  who presents for initial visit for further evaluation and treatment of heart failure/cardiomyopathy.      Extensive past medical history. She underwent cerebral aneurysm repair at [facility] in 1999. In 2003 had progressive left hemiparesis and was found to have a large right middle cerebral artery aneurysm, proximal aneurysm clip with reoperation at [facility] in 04/2002.   Patient presented to [facility] with chest pain and volume overload. Found to have severely reduced LVEF 20-25%, LCH with chronically occluded LAD with well developed collaterals, severely elevated filling pressures. Medical therapy was advised for the lesion. Has recently been having some worsening swelling and shortness of breath and has increased her lasix .       SUBJECTIVE:  Patient overall reports that she is doing fair.  She denies any active chest pain and her lower extremity swelling and breathing have improved after she started on the Lasix .  She does note some orthostatic symptoms recently but otherwise denies any significant palpitations, orthopnea, PND.  Discussed the rationale behind her medications.  Denies any low blood pressures at home.  Lives by herself but gets around with a cane, mainly due to deficiencies from her prior cerebral aneurysm.  PMH, current medications, allergies, social history, and family history reviewed in epic.  PHYSICAL EXAM: Vitals:   08/29/24 1349  BP: 128/81  Pulse: 72  SpO2: 99%   GENERAL: Well nourished and in no apparent distress at rest.  PULM:  Normal work of breathing, clear to auscultation bilaterally. Respirations are unlabored.  CARDIAC:  JVP: Flat         Normal rate with regular rhythm. No  murmurs, rubs or gallops.  No edema. Warm and well perfused extremities. ABDOMEN: Soft, non-tender, non-distended. NEUROLOGIC: Patient is oriented x3 with no focal or lateralizing neurologic deficits.    DATA REVIEW  ECG: 06/2024: Normal sinus rhythm, LVH  ECHO: 08/23/2024: LVEF 20 to 25%, grade 1 diastolic dysfunction, no LV thrombus, normal RV function  CATH: 2021: Ostial LAD 100% stenosed with collaterals, RA 10, PCWP 31, PA 55/31, Fick CO/CI 4.56/2.47  ASSESSMENT & PLAN:  Chronic systolic heart failure: Ischemic with prior LAD infarct and resulting wall motion abnormalities.  Ejection fraction remains reduced despite medical therapy, given suspected infarct would not expect her ejection fraction to improve significantly and would benefit from referral for ICD placement. - EP referral for ICD placement - Increase Entresto  to 49/51 mg twice daily - Continue metoprolol  succinate 37.5 mg daily, increase at next visit - Continue spinal lactone 25 mg daily, empagliflozin  10 mg daily - Decrease Lasix  to 40 mg daily with additional dose as needed - Consider cardiac rehab in the future  Coronary artery disease: - Known LAD infarct, last cath 2021 - No new anginal symptoms - Continue aspirin  81 mg daily  Cerebral aneurysm/CVA: - S/p prior coiling.  Some residual defects - Continue aspirin   HLD: - Last LDL well-controlled, continue Repatha     Follow up in 2-3 months  Morene Brownie, MD Advanced Heart Failure Mechanical Circulatory Support 08/29/24

## 2024-08-29 NOTE — Patient Instructions (Signed)
 Medication Changes:  INCREASE Entresto  49/51mg  (1 tab) two times daily  START Furosemide  40mg  (1 tab) daily   Referrals:  You have been referred to [facility] for AICD placement. This office will contact you in order to schedule your appointment.  Follow-Up in: Please follow up with the Advanced Heart Failure Clinic in 3 months with Dr. Zenaida. We do not currently have that schedule. Please give us  a call in November in order to schedule your appointment for December.   Thank you for choosing [facility] Failure [facility].    At the Advanced Heart Failure Clinic, you and your health needs are our priority. We have a designated team specialized in the treatment of Heart Failure. This Care Team includes your primary Heart Failure Specialized Cardiologist (physician), Advanced Practice Providers (APPs- Physician Assistants and Nurse Practitioners), and Pharmacist who all work together to provide you with the care you need, when you need it.   You may see any of the following providers on your designated Care Team at your next follow up:  Dr. Toribio Fuel Dr. Ezra Shuck Dr. Ria Commander Dr. Morene Zenaida Ellouise Class, FNP Jaun Bash, RPH-CPP  Please be sure to bring in all your medications bottles to every appointment.   Need to Contact Us :  If you have any questions or concerns before your next appointment please send us  a message through [place] or call our office at [phone].    TO LEAVE A MESSAGE FOR THE NURSE SELECT OPTION 2, PLEASE LEAVE A MESSAGE INCLUDING: YOUR NAME DATE OF BIRTH CALL BACK NUMBER REASON FOR CALL**this is important as we prioritize the call backs  YOU WILL RECEIVE A CALL BACK THE SAME DAY AS LONG AS YOU CALL BEFORE 4:00 PM

## 2024-08-30 ENCOUNTER — Telehealth: Payer: Self-pay | Admitting: Cardiovascular Disease

## 2024-08-30 NOTE — Telephone Encounter (Signed)
*  STAT* If patient is at the pharmacy, call can be transferred to refill team.   1. Which medications need to be refilled? (please list name of each medication and dose if known)   alendronate (FOSAMAX) 70 MG tablet    2. Which pharmacy/location (including street and city if local pharmacy) is medication to be sent to?  [id number] - [place], [place] - [address]    3. Do they need a 30 day or 90 day supply? 90

## 2024-08-30 NOTE — Telephone Encounter (Signed)
 Pt last seen by Tylene Lunch NP on 08/24/24. This RX request is for a Non-Cardiac RX. Does Tylene Lunch NP want to refill? Please advise.

## 2024-08-31 NOTE — Telephone Encounter (Signed)
Please send refill request to PCP

## 2024-09-03 ENCOUNTER — Ambulatory Visit: Admitting: Cardiology

## 2024-09-13 ENCOUNTER — Ambulatory Visit: Attending: Cardiology | Admitting: Cardiology

## 2024-09-13 ENCOUNTER — Encounter: Payer: Self-pay | Admitting: Cardiology

## 2024-09-13 VITALS — BP 100/63 | HR 63 | Ht 65.0 in | Wt 192.0 lb

## 2024-09-13 DIAGNOSIS — I5020 Unspecified systolic (congestive) heart failure: Secondary | ICD-10-CM

## 2024-09-13 DIAGNOSIS — E785 Hyperlipidemia, unspecified: Secondary | ICD-10-CM

## 2024-09-13 DIAGNOSIS — I639 Cerebral infarction, unspecified: Secondary | ICD-10-CM

## 2024-09-13 DIAGNOSIS — R002 Palpitations: Secondary | ICD-10-CM

## 2024-09-13 DIAGNOSIS — I25118 Atherosclerotic heart disease of native coronary artery with other forms of angina pectoris: Secondary | ICD-10-CM | POA: Diagnosis not present

## 2024-09-13 DIAGNOSIS — I255 Ischemic cardiomyopathy: Secondary | ICD-10-CM | POA: Diagnosis not present

## 2024-09-13 DIAGNOSIS — I340 Nonrheumatic mitral (valve) insufficiency: Secondary | ICD-10-CM

## 2024-09-13 MED ORDER — EMPAGLIFLOZIN 10 MG PO TABS: 10.0000 mg | ORAL_TABLET | Freq: Every day | ORAL | 3 refills | Status: DC

## 2024-09-13 MED ORDER — ALENDRONATE SODIUM 70 MG PO TABS: 70.0000 mg | ORAL_TABLET | ORAL | 3 refills | Status: DC

## 2024-09-13 MED ORDER — FUROSEMIDE 40 MG PO TABS: 20.0000 mg | ORAL_TABLET | Freq: Every day | ORAL | 3 refills | Status: DC

## 2024-09-13 MED ORDER — FLUTICASONE-SALMETEROL 500-50 MCG/ACT IN AEPB: 1.0000 | INHALATION_SPRAY | Freq: Two times a day (BID) | RESPIRATORY_TRACT | 3 refills | Status: AC

## 2024-09-13 NOTE — Addendum Note (Signed)
 Addended by: Hortense Cantrall D on: 09/13/2024 09:00 AM   Modules accepted: Orders

## 2024-09-13 NOTE — Patient Instructions (Addendum)
 Medication Instructions:  Your physician recommends the following medication changes.  DECREASE: Lasix  to 20 mg daily  *If you need a refill on your cardiac medications before your next appointment, please call your pharmacy*  Lab Work: Your provider would like for you to have following labs drawn today BMP.   If you have labs (blood work) drawn today and your tests are completely normal, you will receive your results only by: MyChart Message (if you have MyChart) OR A paper copy in the mail If you have any lab test that is abnormal or we need to change your treatment, we will call you to review the results.  Testing/Procedures: No test ordered today   Follow-Up: At [facility], you and your health needs are our priority.  As part of our continuing mission to provide you with exceptional heart care, our providers are all part of one team.  This team includes your primary Cardiologist (physician) and Advanced Practice Providers or APPs (Physician Assistants and Nurse Practitioners) who all work together to provide you with the care you need, when you need it.  Your next appointment:   Schedule EP referral Move 11/4 appointment to Dec/Jan  Provider:   Tylene Lunch, NP  We recommend signing up for the patient portal called MyChart.  Sign up information is provided on this After Visit Summary.  MyChart is used to connect with patients for Virtual Visits (Telemedicine).  Patients are able to view lab/test results, encounter notes, upcoming appointments, etc.  Non-urgent messages can be sent to your provider as well.   To learn more about what you can do with MyChart, go to [email or web address].

## 2024-09-13 NOTE — Progress Notes (Signed)
 Cardiology Office Note   Date:  09/13/2024  ID:  Hannah Martinez, DOB September 16, 1956, MRN [id number] PCP: [facility]  [facility] Providers Cardiologist:  Evalene Lunger, MD Cardiology APP:  Gerard Frederick, NP     History of Present Illness Hannah Martinez is a 68 y.o. female with a past medical history of coronary artery disease with chronic ostial LAD with collaterals, ischemic cardiomyopathy, chronic HFrEF, CVA, hyperlipidemia, GERD, cerebral aneurysm status post clipping, who is here today to follow-up on her coronary artery disease and HFrEF.   She underwent cerebral aneurysm repair at [facility] in 1999. In 2003 had progressive left hemiparesis and was found to have a large right middle cerebral artery aneurysm, proximal aneurysm clip with reoperation at [facility] in 04/2002. In 09/2012 she had a lipoma removed from her right frontal bone that was complicated by MRSA infection. She presented to [facility] in 2021 with cough, wheezing, and chest pain. She underwent echo in 02/2020 that showed an EF of 20-25%, mild to moderate MR. [facility] showed chronic occluded ostial LAD with right to left and left to left collaterals, mild disease (30%) to left circumflex. RHC revealed severely elevated filling pressures, moderate pulmonary hypertension, mildly reduced cardiac output. Suspect LAD infarct 2 weeks prior to cath and questioned whether or not the anterior wall is viable. Cough and shortness of breath were attributed to severe volume overload and she was diuresed and started on GDMT. Repeat echocardiogram 03/16/2023 revealed an LVEF of 25-30%, severely decreased function and RWMA, G1 trivial mitral valve regurgitation.   She was seen in clinic 04/11/24 for CHF been doing well.  She previously wore a ZIO XT monitor for 2 weeks to rule out arrhythmia and increased her Toprol  at that time.  At return visit her palpitations have resolved.  Patient was seen in clinic 07/12/2024 overall doing well  from a cardiac perspective.  She had increased mild swelling to her lower extremities primarily on her feet and ankles causing discoloration of the skin.  She Repatha  had reduced her cholesterol and she been compliant with remainder medications without hospitalizations or visits to the emergency department.  She was seen in clinic 08/24/2024 for and continued to have swelling to her lower extremities worsening shortness of breath on exertion.  She did take an additional furosemide .  She had recently undergone an updated echocardiogram that showed a drop in her LVEF even with GDMT.  She was referred to advanced heart failure.   She was last seen in clinic 09/15/2024 by Dr. Zenaida of advanced heart failure.  Overall she stated she was doing fair.  Denied any active chest pain and continued to have lower extremity swelling and her breathing had improved since her furosemide .  Referred to EP for ICD placement.  Her Entresto  was increased to 49/51 mg twice daily and Lasix  was decreased to 40 mg daily with an additional dose as needed.  They also discussed cardiac rehab in the future.  She was to return in 2 to 3 months.  She returns to clinic today stating she has been doing well from cardiac perspective.  She states that she occasionally feels like she has to stop to take a deep breath then but denies any chest pain, peripheral edema.  States that she has been compliant with her current medication regimen without any undue side effects.  She has noted that she has been unable to sleep at night and has been requiring the use of Tylenol  PM.  Denies any  hospitalizations or visits to the emergency department.  ROS: 10 point review of systems has been reviewed and considered negative with exception was been listed in the HPI  Studies Reviewed EKG Interpretation Date/Time:  Thursday September 13 2024 08:25:02 EDT Ventricular Rate:  63 PR Interval:  156 QRS Duration:  78 QT Interval:  440 QTC Calculation: 450 R  Axis:   2  Text Interpretation: Normal sinus rhythm Septal infarct (cited on or before 24-Feb-2020) When compared with ECG of 12-Jul-2024 08:05, No significant change since last tracing Confirmed by Gerard Frederick ([id number]) on 09/13/2024 8:27:40 AM    2D echo 08/23/2024 1. Left ventricular ejection fraction, by estimation, is 20 to 25%. The  left ventricle has severely decreased function. The left ventricle  demonstrates regional wall motion abnormalities (see scoring  diagram/findings for description). Left ventricular  diastolic parameters are consistent with Grade I diastolic dysfunction  (impaired relaxation). No LV thrombus seen.   2. Right ventricular systolic function is normal. The right ventricular  size is normal.    Event Monitor (Zio) 01/23/2024 Normal sinus rhythm Patient had a min HR of 48 bpm, max HR of 162 bpm, and avg HR of 68 bpm.    7 Supraventricular Tachycardia runs occurred, the run with the fastest interval lasting 19.8 secs with a max rate of 162 bpm (avg 118 bpm); the run with the fastest interval was also the longest.    Isolated SVEs were rare (<1.0%), SVE Couplets were rare (<1.0%), and SVE Triplets were rare (<1.0%).    Isolated VEs were rare (<1.0%, 7005), VE Couplets were rare (<1.0%, 11), and VE Triplets were rare (<1.0%, 3).  Ventricular Bigeminy and Trigeminy were present.    Patient triggered event (1) associated with normal sinus rhythm   TTE 03/16/23 1. Left ventricular ejection fraction, by estimation, is 25 to 30%. The  left ventricle has severely decreased function. The left ventricle  demonstrates regional wall motion abnormalities (see scoring  diagram/findings for description). Left ventricular  diastolic parameters are consistent with Grade I diastolic dysfunction  (impaired relaxation). There is akinesis of the left ventricular, entire  anterior wall and anteroseptal wall.   2. Right ventricular systolic function is low normal. The right   ventricular size is normal.   3. The mitral valve is normal in structure. Trivial mitral valve  regurgitation.   4. The aortic valve is tricuspid. Aortic valve regurgitation is not  visualized.   5. The inferior vena cava is normal in size with greater than 50%  respiratory variability, suggesting right atrial pressure of 3 mmHg.    TTE 10/14/2021 1. Left ventricular ejection fraction, by estimation, is 25 to 30%. The  left ventricle has severely decreased function. The left ventricle  demonstrates regional wall motion abnormalities (see scoring  diagram/findings for description). Left ventricular  diastolic parameters are consistent with Grade I diastolic dysfunction  (impaired relaxation). There is akinesis of the left ventricular, entire  septal wall.   2. Right ventricular systolic function is low normal. The right  ventricular size is normal.   3. The mitral valve is normal in structure. No evidence of mitral valve  regurgitation.   4. The aortic valve was not well visualized. Aortic valve regurgitation  is not visualized.   5. The inferior vena cava is normal in size with greater than 50%  respiratory variability, suggesting right atrial pressure of 3 mmHg.    TTE7/28/21 1. Left ventricular ejection fraction, by estimation, is 25 to 30%. The  left ventricle has severely decreased function. The left ventricle  demonstrates regional wall motion abnormalities (see scoring  diagram/findings for description). Left ventricular  diastolic parameters are consistent with Grade I diastolic dysfunction  (impaired relaxation). There is akinesis of the left ventricular, entire  septal wall.   2. Right ventricular systolic function is normal. The right ventricular  size is normal.   3. The mitral valve is normal in structure. No evidence of mitral valve  regurgitation.   4. The aortic valve is normal in structure. Aortic valve regurgitation is  not visualized.   5. The inferior vena  cava is normal in size with greater than 50%  respiratory variability, suggesting right atrial pressure of 3 mmHg.    [facility] 02/2020 Ost LAD to Prox LAD lesion is 100% stenosed. Mid Cx lesion is 30% stenosed. 3rd Mrg lesion is 30% stenosed.   1.  Left dominant coronary arteries with chronically occluded ostial LAD with some right to left and left to left collaterals.  Mild disease affecting the left circumflex. 2.  Right heart catheterization showed severely elevated filling pressures with RA pressure of 10 mmHg, pulmonary capillary wedge pressure of 31 mmHg with prominent V wave suggestive of mitral regurgitation, moderate pulmonary hypertension at 55/31 mmHg and mildly reduced cardiac output at 4.56 with a cardiac index of 2.47.  LVEDP was 34 mmHg.   Recommendations: The patient likely had an LAD infarct 2 weeks ago.  I suspect that the anterior wall is now nonviable.  Unfortunately, she is left with severe ischemic cardiomyopathy and right heart catheterization shows evidence of severe volume overload likely responsible for her symptoms of shortness of breath and dry cough. I am going to start intravenous diuresis with furosemide  40 mg twice daily.  I added small dose carvedilol  and losartan .  Recommend small dose spironolactone  before hospital discharge and consider switching losartan  to Entresto  as an outpatient. Recommend aggressive medical therapy for coronary artery disease.   TTE 02/25/20 1. Left ventricular ejection fraction, by estimation, is 20 to 25%. The  left ventricle has severely decreased function. The left ventricle  demonstrates regional wall motion abnormalities (see scoring  diagram/findings for description). The left  ventricular internal cavity size was mildly dilated. Left ventricular  diastolic parameters are consistent with Grade II diastolic dysfunction  (pseudonormalization). There is akinesis of the left ventricular,  mid-apical anteroseptal wall, anterior segment    and apical segment. Prominent apical trabeculation with no clear apical  thrombus with echo contrast.   2. Right ventricular systolic function is normal. The right ventricular  size is normal. Tricuspid regurgitation signal is inadequate for assessing  PA pressure.   3. Left atrial size was mildly dilated.   4. The mitral valve is normal in structure and function. Mild to moderate  mitral valve regurgitation. No evidence of mitral stenosis.   5. The aortic valve is normal in structure and function. Aortic valve  regurgitation is not visualized. No aortic stenosis is present.   Risk Assessment/Calculations           Physical Exam VS:  BP 100/63 (BP Location: Right Arm, Patient Position: Sitting, Cuff Size: Normal)   Pulse 63   Ht 5' 5 (1.651 m)   Wt 192 lb (87.1 kg)   SpO2 98%   BMI 31.95 kg/m        Wt Readings from Last 3 Encounters:  09/13/24 192 lb (87.1 kg)  08/29/24 192 lb (87.1 kg)  08/24/24 195 lb 6.4 oz (88.6 kg)  GEN: Well nourished, well developed in no acute distress NECK: No JVD; No carotid bruits CARDIAC: RRR, no murmurs, rubs, gallops RESPIRATORY:  Clear to auscultation without rales, wheezing or rhonchi  ABDOMEN: Soft, non-tender, non-distended EXTREMITIES:  No edema; No deformity   ASSESSMENT AND PLAN Chronic HFrEF/ischemic cardiomyopathy with most recent echocardiogram revealed an LVEF of 20-25%.  She has been continued on GDMT of Entresto  49/51 mg twice daily, spironolactone  25 mg daily, Toprol -XL 37.5 mg daily, and with drop in blood pressure furosemide  has been dropped from 40 mg to 20 mg daily and Jardiance  10 mg daily. Recently followed up with advanced heart failure and was advised that she would need follow-up with the EP for defibrillator implantation evaluation.  With recent increase in her Entresto  complete heart failure clinic she has been sent for an updated BMP today.  Coronary artery disease where she had previously presented with an anterior  STEMI in 02/2020.  Right and left heart catheterization completed with CTO of the distal LAD and right to left and left to left collaterals with mild disease in the left circumflex revealed 30% stenosis.  Unfortunately she was unable to get a cardiac MRI due to the metal from cerebral aneurysm repair.  She continues to deny chest pain or chest discomfort.  EKG today reveals sinus rhythm with old septal infarct with a rate of 63 with no acute ischemic changes noted.  She is continued on aspirin  81 mg daily, ezetimibe  10 mg daily, rosuvastatin  40 mg daily, Repatha  140 mg every 2 weeks.  At this time no further ischemic workup is needed.  Cerebral aneurysm/CVA with prior ambulatory residual deficits.  She is continued on aspirin  and statin therapy.  Hyperlipidemia with last LDL 33.  Continue on current medication regimen.  She no longer follows with lipid clinic in [place] as LDL remains at goal on her current regimen.  Mitral regurgitation previously noted on echocardiogram that was mild to moderate now trivial on most recent echocardiogram.  Will continue to monitor with surveillance studies.  Palpitations that have been quiescent with the increase of her Toprol -XL to 37.5 mg daily.       Dispo: Patient to return to clinic with MD/APP in 3 months or sooner if needed for further evaluation.  Signed, Cieara Stierwalt, NP

## 2024-09-14 ENCOUNTER — Ambulatory Visit: Payer: Self-pay | Admitting: Cardiology

## 2024-09-14 DIAGNOSIS — Z79899 Other long term (current) drug therapy: Secondary | ICD-10-CM

## 2024-09-14 LAB — BASIC METABOLIC PANEL WITH GFR
BUN/Creatinine Ratio: 11 — ABNORMAL LOW (ref 12–28)
BUN: 12 mg/dL (ref 8–27)
CO2: 20 mmol/L (ref 20–29)
Calcium: 9.3 mg/dL (ref 8.7–10.3)
Chloride: 104 mmol/L (ref 96–106)
Creatinine, Ser: 1.08 mg/dL — ABNORMAL HIGH (ref 0.57–1.00)
Glucose: 114 mg/dL — ABNORMAL HIGH (ref 70–99)
Potassium: 4.1 mmol/L (ref 3.5–5.2)
Sodium: 140 mmol/L (ref 134–144)
eGFR: 56 mL/min/1.73 — ABNORMAL LOW (ref >59)

## 2024-09-14 NOTE — Progress Notes (Signed)
 Slight bump noted in kidney function from a baseline but overall stable with stable potassium.  Recommend continuing with recommended hydration levels and repeating BMP in 2 weeks.

## 2024-09-24 ENCOUNTER — Telehealth: Payer: Self-pay | Admitting: Cardiovascular Disease

## 2024-09-24 NOTE — Telephone Encounter (Signed)
 Pt made aware of the below results and verbalized understanding.   Gerard Frederick, NP to Maudine Jon BIRCH, RN    09/14/24  9:51 AM Note     Slight bump noted in kidney function from a baseline but overall stable with stable potassium.  Recommend continuing with recommended hydration levels and repeating BMP in 2 weeks.      Frederick Gerard, NP 08/23/2024  5:27 PM EDT     Heart squeeze remains reduced to 20-25%.  No significant change from prior study.  Recommend referral to advanced heart failure clinic.  Continue current medication regimen without changes at this time.

## 2024-09-24 NOTE — Telephone Encounter (Signed)
Patient wants a call back regarding test results

## 2024-10-02 ENCOUNTER — Telehealth: Payer: Self-pay | Admitting: Pharmacy Technician

## 2024-10-02 NOTE — Telephone Encounter (Signed)
   Pharmacy Patient Advocate Encounter   Received notification from Onbase that prior authorization for repatha  is required/requested.   Insurance verification completed.   The patient is insured through [facility].   Per test claim: PA required; PA submitted to above mentioned insurance via Latent Key/confirmation #/EOC [id number] Status is pending

## 2024-10-02 NOTE — Telephone Encounter (Signed)
 Pharmacy Patient Advocate Encounter  Received notification from [facility] that Prior Authorization for repatha  has been APPROVED from 10/02/24 to 12/26/25   PA #/Case ID/Reference #: [id number]

## 2024-10-05 ENCOUNTER — Other Ambulatory Visit
Admission: RE | Admit: 2024-10-05 | Discharge: 2024-10-05 | Disposition: A | Discharge status: Home / Self Care | Source: Ambulatory Visit | Attending: Cardiology | Admitting: Cardiology

## 2024-10-05 DIAGNOSIS — Z79899 Other long term (current) drug therapy: Secondary | ICD-10-CM | POA: Diagnosis present

## 2024-10-05 LAB — BASIC METABOLIC PANEL WITH GFR
Anion gap: 12 (ref 5–15)
BUN: 9 mg/dL (ref 8–23)
CO2: 23 mmol/L (ref 22–32)
Calcium: 9 mg/dL (ref 8.9–10.3)
Chloride: 108 mmol/L (ref 98–111)
Creatinine, Ser: 0.99 mg/dL (ref 0.44–1.00)
GFR, Estimated: >60 mL/min (ref >60)
Glucose, Bld: 99 mg/dL (ref 70–99)
Potassium: 3.8 mmol/L (ref 3.5–5.1)
Sodium: 143 mmol/L (ref 135–145)

## 2024-10-08 ENCOUNTER — Ambulatory Visit: Payer: Self-pay | Admitting: Cardiology

## 2024-10-08 NOTE — Progress Notes (Signed)
 Labs have remained stable no current changes needed to current medication regimen at this time.

## 2024-10-17 ENCOUNTER — Ambulatory Visit: Admitting: Cardiology

## 2024-10-30 ENCOUNTER — Ambulatory Visit: Admitting: Cardiology

## 2024-10-30 ENCOUNTER — Other Ambulatory Visit: Payer: Self-pay | Admitting: Cardiology

## 2024-10-30 DIAGNOSIS — I25118 Atherosclerotic heart disease of native coronary artery with other forms of angina pectoris: Secondary | ICD-10-CM

## 2024-10-30 DIAGNOSIS — E785 Hyperlipidemia, unspecified: Secondary | ICD-10-CM

## 2024-10-30 MED ORDER — REPATHA SURECLICK 140 MG/ML ~~LOC~~ SOAJ: 140.0000 mg | SUBCUTANEOUS | 3 refills | Status: DC

## 2024-10-30 NOTE — Telephone Encounter (Signed)
*  STAT* If patient is at the pharmacy, call can be transferred to refill team.   1. Which medications need to be refilled? (please list name of each medication and dose if known)  Evolocumab  (REPATHA  SURECLICK) 140 MG/ML SOAJ  2. Which pharmacy/location (including street and city if local pharmacy) is medication to be sent to? [id number] - [place], [place] - [address]    3. Do they need a 30 day or 90 day supply?  90 day supply

## 2024-11-26 ENCOUNTER — Other Ambulatory Visit: Payer: Self-pay | Admitting: Cardiology

## 2024-11-30 MED ORDER — EZETIMIBE 10 MG PO TABS: 10.0000 mg | ORAL_TABLET | Freq: Every day | ORAL | 2 refills | Status: AC

## 2024-11-30 MED ORDER — FUROSEMIDE 40 MG PO TABS: 20.0000 mg | ORAL_TABLET | Freq: Every day | ORAL | 2 refills | Status: AC

## 2024-11-30 MED ORDER — OMEPRAZOLE 20 MG PO CPDR: 20.0000 mg | DELAYED_RELEASE_CAPSULE | Freq: Every day | ORAL | 2 refills | Status: AC

## 2024-12-12 ENCOUNTER — Ambulatory Visit: Admitting: Cardiology

## 2024-12-12 NOTE — Progress Notes (Deleted)
 Cardiology Office Note   Date:  12/12/2024  ID:  Hannah Martinez, DOB [birth year], MRN [id number] PCP: [facility]  [facility] Providers Cardiologist:  Evalene Lunger, MD Cardiology APP:  Gerard Frederick, NP  Electrophysiologist:  Fonda Kitty, MD { Click to update primary MD,subspecialty MD or APP then REFRESH:1}    History of Present Illness Hannah Martinez is a 68 y.o. female with a past medical history of coronary artery disease is with chronic ostial LAD with collaterals, ischemic cardiomyopathy, chronic HFrEF, CVA, hyperlipidemia, GERD, cerebral aneurysm status post clipping, who is here today for follow-up.   She underwent cerebral aneurysm repair at [facility] in 1999. In 2003 had progressive left hemiparesis and was found to have a large right middle cerebral artery aneurysm, proximal aneurysm clip with reoperation at [facility] in 04/2002. In 09/2012 she had a lipoma removed from her right frontal bone that was complicated by MRSA infection. She presented to [facility] in 2021 with cough, wheezing, and chest pain. She underwent echo in 02/2020 that showed an EF of 20-25%, mild to moderate MR. [facility] showed chronic occluded ostial LAD with right to left and left to left collaterals, mild disease (30%) to left circumflex. RHC revealed severely elevated filling pressures, moderate pulmonary hypertension, mildly reduced cardiac output. Suspect LAD infarct 2 weeks prior to cath and questioned whether or not the anterior wall is viable. Cough and shortness of breath were attributed to severe volume overload and she was diuresed and started on GDMT. Repeat echocardiogram 03/16/2023 revealed an LVEF of 25-30%, severely decreased function and RWMA, G1 trivial mitral valve regurgitation.   She was seen in clinic 04/11/24 for CHF been doing well.  She previously wore a ZIO XT monitor for 2 weeks to rule out arrhythmia and increased her Toprol  at that time.  At return visit her palpitations  have resolved.  Patient was seen in clinic 07/12/2024 overall doing well from a cardiac perspective.  She had increased mild swelling to her lower extremities primarily on her feet and ankles causing discoloration of the skin.  She Repatha  had reduced her cholesterol and she been compliant with remainder medications without hospitalizations or visits to the emergency department.  She was seen in clinic 08/24/2024 for and continued to have swelling to her lower extremities worsening shortness of breath on exertion.  She did take an additional furosemide .  She had recently undergone an updated echocardiogram that showed a drop in her LVEF even with GDMT.  She was referred to advanced heart failure.  She previously been seen/20/25 podiatrist for advanced heart failure.  Overall she was doing well.  She denied any active chest pain but continued to have lower extremity edema and her breathing had improved since taking furosemide .  She was referred to EP for ICD placement.  Entresto  was increased and Lasix  was decreased.  They also discussed cardiac rehab in the future and she was to return within 2 to 3 months.  She was last seen in clinic 08/14/2019 perspective.  She occasionally felt like she was not to take a deep breath but continues to deny any chest pain and peripheral edema had improved.  She had been compliant with her current medication regimen.  There were no medication changes that were made and she was sent for updated labs and no further testing was ordered.   She returns to clinic today  ROS: 10 point review of system has been reviewed and considered negative except listed in the HPI  Studies  Reviewed      2D echo 08/23/2024 1. Left ventricular ejection fraction, by estimation, is 20 to 25%. The  left ventricle has severely decreased function. The left ventricle  demonstrates regional wall motion abnormalities (see scoring  diagram/findings for description). Left ventricular  diastolic  parameters are consistent with Grade I diastolic dysfunction  (impaired relaxation). No LV thrombus seen.   2. Right ventricular systolic function is normal. The right ventricular  size is normal.    Event Monitor (Zio) 01/23/2024 Normal sinus rhythm Patient had a min HR of 48 bpm, max HR of 162 bpm, and avg HR of 68 bpm.    7 Supraventricular Tachycardia runs occurred, the run with the fastest interval lasting 19.8 secs with a max rate of 162 bpm (avg 118 bpm); the run with the fastest interval was also the longest.    Isolated SVEs were rare (<1.0%), SVE Couplets were rare (<1.0%), and SVE Triplets were rare (<1.0%).    Isolated VEs were rare (<1.0%, 7005), VE Couplets were rare (<1.0%, 11), and VE Triplets were rare (<1.0%, 3).  Ventricular Bigeminy and Trigeminy were present.    Patient triggered event (1) associated with normal sinus rhythm   TTE 03/16/23 1. Left ventricular ejection fraction, by estimation, is 25 to 30%. The  left ventricle has severely decreased function. The left ventricle  demonstrates regional wall motion abnormalities (see scoring  diagram/findings for description). Left ventricular  diastolic parameters are consistent with Grade I diastolic dysfunction  (impaired relaxation). There is akinesis of the left ventricular, entire  anterior wall and anteroseptal wall.   2. Right ventricular systolic function is low normal. The right  ventricular size is normal.   3. The mitral valve is normal in structure. Trivial mitral valve  regurgitation.   4. The aortic valve is tricuspid. Aortic valve regurgitation is not  visualized.   5. The inferior vena cava is normal in size with greater than 50%  respiratory variability, suggesting right atrial pressure of 3 mmHg.    TTE 10/14/2021 1. Left ventricular ejection fraction, by estimation, is 25 to 30%. The  left ventricle has severely decreased function. The left ventricle  demonstrates regional wall motion  abnormalities (see scoring  diagram/findings for description). Left ventricular  diastolic parameters are consistent with Grade I diastolic dysfunction  (impaired relaxation). There is akinesis of the left ventricular, entire  septal wall.   2. Right ventricular systolic function is low normal. The right  ventricular size is normal.   3. The mitral valve is normal in structure. No evidence of mitral valve  regurgitation.   4. The aortic valve was not well visualized. Aortic valve regurgitation  is not visualized.   5. The inferior vena cava is normal in size with greater than 50%  respiratory variability, suggesting right atrial pressure of 3 mmHg.    TTE7/28/21 1. Left ventricular ejection fraction, by estimation, is 25 to 30%. The  left ventricle has severely decreased function. The left ventricle  demonstrates regional wall motion abnormalities (see scoring  diagram/findings for description). Left ventricular  diastolic parameters are consistent with Grade I diastolic dysfunction  (impaired relaxation). There is akinesis of the left ventricular, entire  septal wall.   2. Right ventricular systolic function is normal. The right ventricular  size is normal.   3. The mitral valve is normal in structure. No evidence of mitral valve  regurgitation.   4. The aortic valve is normal in structure. Aortic valve regurgitation is  not visualized.  5. The inferior vena cava is normal in size with greater than 50%  respiratory variability, suggesting right atrial pressure of 3 mmHg.    [facility] 02/2020 Ost LAD to Prox LAD lesion is 100% stenosed. Mid Cx lesion is 30% stenosed. 3rd Mrg lesion is 30% stenosed.   1.  Left dominant coronary arteries with chronically occluded ostial LAD with some right to left and left to left collaterals.  Mild disease affecting the left circumflex. 2.  Right heart catheterization showed severely elevated filling pressures with RA pressure of 10 mmHg, pulmonary  capillary wedge pressure of 31 mmHg with prominent V wave suggestive of mitral regurgitation, moderate pulmonary hypertension at 55/31 mmHg and mildly reduced cardiac output at 4.56 with a cardiac index of 2.47.  LVEDP was 34 mmHg.   Recommendations: The patient likely had an LAD infarct 2 weeks ago.  I suspect that the anterior wall is now nonviable.  Unfortunately, she is left with severe ischemic cardiomyopathy and right heart catheterization shows evidence of severe volume overload likely responsible for her symptoms of shortness of breath and dry cough. I am going to start intravenous diuresis with furosemide  40 mg twice daily.  I added small dose carvedilol  and losartan .  Recommend small dose spironolactone  before hospital discharge and consider switching losartan  to Entresto  as an outpatient. Recommend aggressive medical therapy for coronary artery disease.   TTE 02/25/20 1. Left ventricular ejection fraction, by estimation, is 20 to 25%. The  left ventricle has severely decreased function. The left ventricle  demonstrates regional wall motion abnormalities (see scoring  diagram/findings for description). The left  ventricular internal cavity size was mildly dilated. Left ventricular  diastolic parameters are consistent with Grade II diastolic dysfunction  (pseudonormalization). There is akinesis of the left ventricular,  mid-apical anteroseptal wall, anterior segment   and apical segment. Prominent apical trabeculation with no clear apical  thrombus with echo contrast.   2. Right ventricular systolic function is normal. The right ventricular  size is normal. Tricuspid regurgitation signal is inadequate for assessing  PA pressure.   3. Left atrial size was mildly dilated.   4. The mitral valve is normal in structure and function. Mild to moderate  mitral valve regurgitation. No evidence of mitral stenosis.   5. The aortic valve is normal in structure and function. Aortic valve   regurgitation is not visualized. No aortic stenosis is present.   Risk Assessment/Calculations   No BP recorded.  {Refresh Note OR Click here to enter BP  :1}***       Physical Exam VS:  There were no vitals taken for this visit.       Wt Readings from Last 3 Encounters:  09/13/24 192 lb (87.1 kg)  08/29/24 192 lb (87.1 kg)  08/24/24 195 lb 6.4 oz (88.6 kg)    GEN: Well nourished, well developed in no acute distress NECK: No JVD; No carotid bruits CARDIAC: ***RRR, no murmurs, rubs, gallops RESPIRATORY:  Clear to auscultation without rales, wheezing or rhonchi  ABDOMEN: Soft, non-tender, non-distended EXTREMITIES:  No edema; No deformity   ASSESSMENT AND PLAN Coronary artery disease Chronic HFrEF/ischemic cardiomyopathy Cerebral aneurysm/CVA Mixed hyperlipidemia Mitral regurgitation Palpitations    {Are you ordering a CV Procedure (e.g. stress test, cath, DCCV, TEE, etc)?   Press F2        :[id number]}  Dispo: ***  Signed, Arfa Lamarca, NP

## 2024-12-17 NOTE — Progress Notes (Unsigned)
 " Cardiology Office Note   Date:  12/18/2024  ID:  Hannah Martinez, DOB 22-Apr-1956, MRN [id number] PCP: [facility]  [facility] Providers Cardiologist:  Evalene Lunger, MD Cardiology APP:  Gerard Frederick, NP  Electrophysiologist:  Fonda Kitty, MD     History of Present Illness Hannah Martinez is a 68 y.o. female with a past medical history of coronary artery disease is with chronic ostial LAD with collaterals, ischemic cardiomyopathy, chronic HFrEF, CVA, hyperlipidemia, GERD, cerebral aneurysm status post clipping, who is here today for follow-up.   She underwent cerebral aneurysm repair at [facility] in 1999. In 2003 had progressive left hemiparesis and was found to have a large right middle cerebral artery aneurysm, proximal aneurysm clip with reoperation at [facility] in 04/2002. In 09/2012 she had a lipoma removed from her right frontal bone that was complicated by MRSA infection. She presented to [facility] in 2021 with cough, wheezing, and chest pain. She underwent echo in 02/2020 that showed an EF of 20-25%, mild to moderate MR. [facility] showed chronic occluded ostial LAD with right to left and left to left collaterals, mild disease (30%) to left circumflex. RHC revealed severely elevated filling pressures, moderate pulmonary hypertension, mildly reduced cardiac output. Suspect LAD infarct 2 weeks prior to cath and questioned whether or not the anterior wall is viable. Cough and shortness of breath were attributed to severe volume overload and she was diuresed and started on GDMT. Repeat echocardiogram 03/16/2023 revealed an LVEF of 25-30%, severely decreased function and RWMA, G1 trivial mitral valve regurgitation.   She was seen in clinic 04/11/24 for CHF been doing well.  She previously wore a ZIO XT monitor for 2 weeks to rule out arrhythmia and increased her Toprol  at that time.  At return visit her palpitations have resolved.  Patient was seen in clinic 07/12/2024 overall  doing well from a cardiac perspective.  She had increased mild swelling to her lower extremities primarily on her feet and ankles causing discoloration of the skin.  She Repatha  had reduced her cholesterol and she been compliant with remainder medications without hospitalizations or visits to the emergency department.  She was seen in clinic 08/24/2024 for and continued to have swelling to her lower extremities worsening shortness of breath on exertion.  She did take an additional furosemide .  She had recently undergone an updated echocardiogram that showed a drop in her LVEF even with GDMT.  She was referred to advanced heart failure.  She previously been seen/20/25 podiatrist for advanced heart failure.  Overall she was doing well.  She denied any active chest pain but continued to have lower extremity edema and her breathing had improved since taking furosemide .  She was referred to EP for ICD placement.  Entresto  was increased and Lasix  was decreased.  They also discussed cardiac rehab in the future and she was to return within 2 to 3 months.  She was last seen in clinic 08/14/2019 perspective.  She occasionally felt like she was not to take a deep breath but continues to deny any chest pain and peripheral edema had improved.  She had been compliant with her current medication regimen.  There were no medication changes that were made and she was sent for updated labs and no further testing was ordered.   She returns to clinic today stating she has been doing well from a cardiac perspective.  States that she continues to have stop on occasion to take a deep breath but other than that  she has had no problems.  States she has been compliant with her current medication regimen without any undue side effects.  Denies any hospitalizations or visits to the emergency department.  Fortunately with the holiday season she also endorsed some alcohol intake.  ROS: 10 point review of system has been reviewed and  considered negative except listed in the HPI  Studies Reviewed EKG Interpretation Date/Time:  Tuesday December 18 2024 09:58:56 EST Ventricular Rate:  62 PR Interval:  156 QRS Duration:  76 QT Interval:  448 QTC Calculation: 454 R Axis:   -5  Text Interpretation: Normal sinus rhythm T wave inversion When compared with ECG of 13-Sep-2024 08:25, No significant change was found Confirmed by Gerard Frederick ([id number]) on 12/18/2024 10:01:39 AM    2D echo 08/23/2024 1. Left ventricular ejection fraction, by estimation, is 20 to 25%. The  left ventricle has severely decreased function. The left ventricle  demonstrates regional wall motion abnormalities (see scoring  diagram/findings for description). Left ventricular  diastolic parameters are consistent with Grade I diastolic dysfunction  (impaired relaxation). No LV thrombus seen.   2. Right ventricular systolic function is normal. The right ventricular  size is normal.    Event Monitor (Zio) 01/23/2024 Normal sinus rhythm Patient had a min HR of 48 bpm, max HR of 162 bpm, and avg HR of 68 bpm.    7 Supraventricular Tachycardia runs occurred, the run with the fastest interval lasting 19.8 secs with a max rate of 162 bpm (avg 118 bpm); the run with the fastest interval was also the longest.    Isolated SVEs were rare (<1.0%), SVE Couplets were rare (<1.0%), and SVE Triplets were rare (<1.0%).    Isolated VEs were rare (<1.0%, 7005), VE Couplets were rare (<1.0%, 11), and VE Triplets were rare (<1.0%, 3).  Ventricular Bigeminy and Trigeminy were present.    Patient triggered event (1) associated with normal sinus rhythm   TTE 03/16/23 1. Left ventricular ejection fraction, by estimation, is 25 to 30%. The  left ventricle has severely decreased function. The left ventricle  demonstrates regional wall motion abnormalities (see scoring  diagram/findings for description). Left ventricular  diastolic parameters are consistent with Grade I  diastolic dysfunction  (impaired relaxation). There is akinesis of the left ventricular, entire  anterior wall and anteroseptal wall.   2. Right ventricular systolic function is low normal. The right  ventricular size is normal.   3. The mitral valve is normal in structure. Trivial mitral valve  regurgitation.   4. The aortic valve is tricuspid. Aortic valve regurgitation is not  visualized.   5. The inferior vena cava is normal in size with greater than 50%  respiratory variability, suggesting right atrial pressure of 3 mmHg.    TTE 10/14/2021 1. Left ventricular ejection fraction, by estimation, is 25 to 30%. The  left ventricle has severely decreased function. The left ventricle  demonstrates regional wall motion abnormalities (see scoring  diagram/findings for description). Left ventricular  diastolic parameters are consistent with Grade I diastolic dysfunction  (impaired relaxation). There is akinesis of the left ventricular, entire  septal wall.   2. Right ventricular systolic function is low normal. The right  ventricular size is normal.   3. The mitral valve is normal in structure. No evidence of mitral valve  regurgitation.   4. The aortic valve was not well visualized. Aortic valve regurgitation  is not visualized.   5. The inferior vena cava is normal in size with greater than  50%  respiratory variability, suggesting right atrial pressure of 3 mmHg.    TTE7/28/21 1. Left ventricular ejection fraction, by estimation, is 25 to 30%. The  left ventricle has severely decreased function. The left ventricle  demonstrates regional wall motion abnormalities (see scoring  diagram/findings for description). Left ventricular  diastolic parameters are consistent with Grade I diastolic dysfunction  (impaired relaxation). There is akinesis of the left ventricular, entire  septal wall.   2. Right ventricular systolic function is normal. The right ventricular  size is normal.   3.  The mitral valve is normal in structure. No evidence of mitral valve  regurgitation.   4. The aortic valve is normal in structure. Aortic valve regurgitation is  not visualized.   5. The inferior vena cava is normal in size with greater than 50%  respiratory variability, suggesting right atrial pressure of 3 mmHg.    [facility] 02/2020 Ost LAD to Prox LAD lesion is 100% stenosed. Mid Cx lesion is 30% stenosed. 3rd Mrg lesion is 30% stenosed.   1.  Left dominant coronary arteries with chronically occluded ostial LAD with some right to left and left to left collaterals.  Mild disease affecting the left circumflex. 2.  Right heart catheterization showed severely elevated filling pressures with RA pressure of 10 mmHg, pulmonary capillary wedge pressure of 31 mmHg with prominent V wave suggestive of mitral regurgitation, moderate pulmonary hypertension at 55/31 mmHg and mildly reduced cardiac output at 4.56 with a cardiac index of 2.47.  LVEDP was 34 mmHg.   Recommendations: The patient likely had an LAD infarct 2 weeks ago.  I suspect that the anterior wall is now nonviable.  Unfortunately, she is left with severe ischemic cardiomyopathy and right heart catheterization shows evidence of severe volume overload likely responsible for her symptoms of shortness of breath and dry cough. I am going to start intravenous diuresis with furosemide  40 mg twice daily.  I added small dose carvedilol  and losartan .  Recommend small dose spironolactone  before hospital discharge and consider switching losartan  to Entresto  as an outpatient. Recommend aggressive medical therapy for coronary artery disease.   TTE 02/25/20 1. Left ventricular ejection fraction, by estimation, is 20 to 25%. The  left ventricle has severely decreased function. The left ventricle  demonstrates regional wall motion abnormalities (see scoring  diagram/findings for description). The left  ventricular internal cavity size was mildly dilated. Left  ventricular  diastolic parameters are consistent with Grade II diastolic dysfunction  (pseudonormalization). There is akinesis of the left ventricular,  mid-apical anteroseptal wall, anterior segment   and apical segment. Prominent apical trabeculation with no clear apical  thrombus with echo contrast.   2. Right ventricular systolic function is normal. The right ventricular  size is normal. Tricuspid regurgitation signal is inadequate for assessing  PA pressure.   3. Left atrial size was mildly dilated.   4. The mitral valve is normal in structure and function. Mild to moderate  mitral valve regurgitation. No evidence of mitral stenosis.   5. The aortic valve is normal in structure and function. Aortic valve  regurgitation is not visualized. No aortic stenosis is present.   Risk Assessment/Calculations           Physical Exam VS:  BP 128/68 (BP Location: Right Arm, Patient Position: Sitting, Cuff Size: Normal)   Pulse 62   Ht 5' 5 (1.651 m)   Wt 196 lb 6.4 oz (89.1 kg)   SpO2 99%   BMI 32.68 kg/m  Wt Readings from Last 3 Encounters:  12/18/24 196 lb 6.4 oz (89.1 kg)  09/13/24 192 lb (87.1 kg)  08/29/24 192 lb (87.1 kg)    GEN: Well nourished, well developed in no acute distress NECK: No JVD; No carotid bruits CARDIAC: RRR, no murmurs, rubs, gallops RESPIRATORY:  Clear to auscultation without rales, wheezing or rhonchi  ABDOMEN: Soft, non-tender, non-distended EXTREMITIES:  No edema; No deformity   ASSESSMENT AND PLAN Coronary artery disease reviewed previously presented with an anterior STEMI 02/2020.  Right and left heart catheterization completed with CTO of the distal LAD and right to left and left to left collaterals with mild disease in the left circumflex revealed 30% stenosis.  Unfortunately she was unable to get a cardiac MRI due to metal from cerebral aneurysm repair.  She continues to deny any chest discomfort or chest pain.  EKG today reveals sinus rhythm  with T wave inversions with a rate of 62 with no acute ischemic changes noted.  She is continued on aspirin  81 mg daily, Repatha  140 mg every 2 weeks, ezetimibe  10 mg daily and rosuvastatin  40 mg daily.  No further ischemic evaluation needed at this time.  Chronic HFrEF/ischemic cardiomyopathy with an LVEF of 20-25%.  She is continued on GDMT of Entresto  49/51 mg twice daily, spironolactone  25 mg daily, Toprol -XL 37.5 mg daily and furosemide  20 mg daily Jardiance  10 mg daily.  She continues to follow with advanced heart failure clinic.  She appears to be euvolemic on exam today.  She has previously been referred to EP for low EF.  ICD implantation.  Upcoming appointment with the EP in January.  Cerebral aneurysm/CVA with prior ambulatory residual deficits.  She is continued on aspirin  and statin therapies.  Mixed hyperlipidemia with last LDL 33.  Continue current medication regimen.  Mitral regurgitation previously noted on echocardiogram that was mild to moderate atrophy on most recent echo.  Will continue to monitor with surveillance studies.  Palpitations which have been quiescent with the increase of Toprol -XL to 37.5 mg daily       Dispo: Patient to return to clinic to see MD/APP in 4 to 5 months or sooner if needed for further evaluation.  Signed, Christapher Gillian, NP   "

## 2024-12-18 ENCOUNTER — Encounter: Payer: Self-pay | Admitting: Cardiology

## 2024-12-18 ENCOUNTER — Ambulatory Visit: Attending: Cardiology | Admitting: Cardiology

## 2024-12-18 VITALS — BP 128/68 | HR 62 | Ht 65.0 in | Wt 196.4 lb

## 2024-12-18 DIAGNOSIS — I639 Cerebral infarction, unspecified: Secondary | ICD-10-CM | POA: Diagnosis not present

## 2024-12-18 DIAGNOSIS — I5020 Unspecified systolic (congestive) heart failure: Secondary | ICD-10-CM | POA: Diagnosis not present

## 2024-12-18 DIAGNOSIS — R002 Palpitations: Secondary | ICD-10-CM | POA: Diagnosis not present

## 2024-12-18 DIAGNOSIS — E785 Hyperlipidemia, unspecified: Secondary | ICD-10-CM | POA: Diagnosis not present

## 2024-12-18 DIAGNOSIS — I2510 Atherosclerotic heart disease of native coronary artery without angina pectoris: Secondary | ICD-10-CM | POA: Diagnosis not present

## 2024-12-18 DIAGNOSIS — I340 Nonrheumatic mitral (valve) insufficiency: Secondary | ICD-10-CM | POA: Diagnosis not present

## 2024-12-18 DIAGNOSIS — I255 Ischemic cardiomyopathy: Secondary | ICD-10-CM | POA: Diagnosis not present

## 2024-12-18 NOTE — Patient Instructions (Signed)
 Medication Instructions:  Your physician recommends that you continue on your current medications as directed. Please refer to the Current Medication list given to you today.   *If you need a refill on your cardiac medications before your next appointment, please call your pharmacy*  Lab Work: No labs ordered today  If you have labs (blood work) drawn today and your tests are completely normal, you will receive your results only by: MyChart Message (if you have MyChart) OR A paper copy in the mail If you have any lab test that is abnormal or we need to change your treatment, we will call you to review the results.  Testing/Procedures: No test ordered today   Follow-Up: At [facility], you and your health needs are our priority.  As part of our continuing mission to provide you with exceptional heart care, our providers are all part of one team.  This team includes your primary Cardiologist (physician) and Advanced Practice Providers or APPs (Physician Assistants and Nurse Practitioners) who all work together to provide you with the care you need, when you need it.  Your next appointment:   Follow up with [facility] Follow up with EP 5 month(s)  Provider:   You may see Timothy Gollan, MD or one of the following Advanced Practice Providers on your designated Care Team:   Tylene Lunch, NP  We recommend signing up for the patient portal called MyChart.  Sign up information is provided on this After Visit Summary.  MyChart is used to connect with patients for Virtual Visits (Telemedicine).  Patients are able to view lab/test results, encounter notes, upcoming appointments, etc.  Non-urgent messages can be sent to your provider as well.   To learn more about what you can do with MyChart, go to [email or web address].

## 2024-12-19 ENCOUNTER — Telehealth: Payer: Self-pay | Admitting: Cardiology

## 2024-12-19 MED ORDER — ALENDRONATE SODIUM 70 MG PO TABS: 70.0000 mg | ORAL_TABLET | ORAL | 3 refills | Status: AC

## 2024-12-19 NOTE — Telephone Encounter (Signed)
" °*  STAT* If patient is at the pharmacy, call can be transferred to refill team.   1. Which medications need to be refilled? (please list name of each medication and dose if known)   alendronate  (FOSAMAX ) 70 MG tablet     2. Would you like to learn more about the convenience, safety, & potential cost savings by using the [facility]?    3. Are you open to using the [facility] (Type [facility]. ).   4. Which pharmacy/location (including street and city if local pharmacy) is medication to be sent to? [id number] - [place], [place] - [address]     5. Do they need a 30 day or 90 day supply? 90  "

## 2024-12-31 ENCOUNTER — Encounter: Payer: Self-pay | Admitting: *Deleted

## 2025-01-02 ENCOUNTER — Telehealth: Payer: Self-pay | Admitting: Cardiology

## 2025-01-02 NOTE — Telephone Encounter (Signed)
 Called to confirm/remind patient of their appointment at the [facility] on 1/826.   Appointment:   [x] Confirmed  [] Left mess   [] No answer/No voice mail  [] VM Full/unable to leave message  [] Phone not in service  Patient reminded to bring all medications and/or complete list.  Confirmed patient has transportation. Gave directions, instructed to utilize valet parking.

## 2025-01-03 ENCOUNTER — Encounter: Payer: Self-pay | Admitting: Cardiology

## 2025-01-03 ENCOUNTER — Ambulatory Visit: Attending: Cardiology | Admitting: Cardiology

## 2025-01-03 VITALS — BP 114/79 | HR 85 | Wt 189.4 lb

## 2025-01-03 DIAGNOSIS — I2510 Atherosclerotic heart disease of native coronary artery without angina pectoris: Secondary | ICD-10-CM | POA: Diagnosis not present

## 2025-01-03 DIAGNOSIS — I252 Old myocardial infarction: Secondary | ICD-10-CM | POA: Insufficient documentation

## 2025-01-03 DIAGNOSIS — I671 Cerebral aneurysm, nonruptured: Secondary | ICD-10-CM | POA: Diagnosis not present

## 2025-01-03 DIAGNOSIS — I639 Cerebral infarction, unspecified: Secondary | ICD-10-CM | POA: Diagnosis not present

## 2025-01-03 DIAGNOSIS — Z79899 Other long term (current) drug therapy: Secondary | ICD-10-CM | POA: Diagnosis not present

## 2025-01-03 DIAGNOSIS — I5022 Chronic systolic (congestive) heart failure: Secondary | ICD-10-CM | POA: Diagnosis present

## 2025-01-03 DIAGNOSIS — I5020 Unspecified systolic (congestive) heart failure: Secondary | ICD-10-CM

## 2025-01-03 DIAGNOSIS — I255 Ischemic cardiomyopathy: Secondary | ICD-10-CM | POA: Diagnosis not present

## 2025-01-03 DIAGNOSIS — E785 Hyperlipidemia, unspecified: Secondary | ICD-10-CM | POA: Insufficient documentation

## 2025-01-03 DIAGNOSIS — Z7982 Long term (current) use of aspirin: Secondary | ICD-10-CM | POA: Diagnosis not present

## 2025-01-03 DIAGNOSIS — Z7984 Long term (current) use of oral hypoglycemic drugs: Secondary | ICD-10-CM | POA: Insufficient documentation

## 2025-01-03 DIAGNOSIS — Z9889 Other specified postprocedural states: Secondary | ICD-10-CM | POA: Diagnosis not present

## 2025-01-03 MED ORDER — EMPAGLIFLOZIN 10 MG PO TABS: 10.0000 mg | ORAL_TABLET | Freq: Every day | ORAL | 3 refills | Status: AC

## 2025-01-03 MED ORDER — METOPROLOL SUCCINATE ER 50 MG PO TB24: 50.0000 mg | ORAL_TABLET | Freq: Every day | ORAL | 3 refills | Status: AC

## 2025-01-03 NOTE — Patient Instructions (Signed)
 Medication Changes:  RESTART Jardiance - Refills sent  STOP taking Metoprolol  37.5 MG daily  START taking Metoprolol  50 MG daily    Special Instructions // Education:  Do the following things EVERYDAY: Weigh yourself in the morning before breakfast. Write it down and keep it in a log. Take your medicines as prescribed Eat low salt foods--Limit salt (sodium) to 2000 mg per day.  Stay as active as you can everyday Limit all fluids for the day to less than 2 liters   Follow-Up in: 4 months with Dr. Zenaida. Please call our office in March to schedule this appointment.    If you have any questions or concerns before your next appointment please send us  a message through [place] or call our office at [phone], If it is after office hours your call will be answered by our answering service and directed appropriately.     At the [facility], you and your health needs are our priority. We have a designated team specialized in the treatment of Heart Failure. This Care Team includes your primary Heart Failure Specialized Cardiologist (physician), Advanced Practice Providers (APPs- Physician Assistants and Nurse Practitioners), and Pharmacist who all work together to provide you with the care you need, when you need it.   You may see any of the following providers on your designated Care Team at your next follow up:  Dr. Toribio Fuel Dr. Ezra Shuck Dr. Ria Commander Dr. Odis Zenaida Greig Mosses, NP Caffie Shed, [place] [address] [place], [place] Beckey Coe, NP Jordan Lee, NP Ellouise Class, NP Jaun Bash, PharmD

## 2025-01-03 NOTE — Progress Notes (Unsigned)
" ° °  ADVANCED HEART FAILURE FOLLOW UP CLINIC NOTE  Referring Physician: [facility]  Primary Care: Center, [facility] Primary Cardiologist:  HPI: Paxtyn Wisdom is a 69 y.o. female who presents for follow up of chronic systolic heart failure.          Extensive past medical history. She underwent cerebral aneurysm repair at [facility] in 1999. In 2003 had progressive left hemiparesis and was found to have a large right middle cerebral artery aneurysm, proximal aneurysm clip with reoperation at [facility] in 04/2002.   Patient presented to [facility] with chest pain and volume overload. Found to have severely reduced LVEF 20-25%, LCH with chronically occluded LAD with well developed collaterals, severely elevated filling pressures. Medical therapy was advised for the lesion. Has recently been having some worsening swelling and shortness of breath and has increased her lasix .        SUBJECTIVE:  Patient reports that overall she is doing well, meets with EP later in the month. She notes that her energy level has been better since increasing her medications at her last visit. She denies any shortness of breath or chest pain.   PMH, current medications, allergies, social history, and family history reviewed in epic.  PHYSICAL EXAM: Vitals:   01/03/25 1514  BP: 114/79  Pulse: 85  SpO2: 94%   GENERAL: Well nourished and in no apparent distress at rest.  PULM:  Normal work of breathing, clear to auscultation bilaterally. Respirations are unlabored.  CARDIAC:  JVP: flat         Normal rate with regular rhythm. No murmurs, rubs or gallops.  No edema. Warm and well perfused extremities. ABDOMEN: Soft, non-tender, non-distended. NEUROLOGIC: Patient is oriented x3 with no focal or lateralizing neurologic deficits.    DATA REVIEW  ECG: 06/2024: Normal sinus rhythm, LVH   ECHO: 08/23/2024: LVEF 20 to 25%, grade 1 diastolic dysfunction, no LV thrombus, normal  RV function   CATH: 2021: Ostial LAD 100% stenosed with collaterals, RA 10, PCWP 31, PA 55/31, Fick CO/CI 4.56/2.47  ASSESSMENT & PLAN:  Chronic systolic heart failure: Ischemic with prior LAD infarct and resulting wall motion abnormalities.  Ejection fraction remains reduced despite medical therapy, given suspected infarct would not expect her ejection fraction to improve significantly and would benefit from referral for ICD placement. Follows up with EP later in the month.  - Stable NYHA class II, euvolemic - EP referral for ICD placement - Continue entresto  49/51mg  BID - Increase metoprolol  succinate to 50mg  daily - Continue spironolactone  25mg  daily - Continue jardiance  10mg  daily - Continue lasix  40mg  daily   Coronary artery disease: - Known LAD infarct, last cath 2021 - No new anginal symptoms - Continue aspirin  81 mg daily   Cerebral aneurysm/CVA: - S/p prior coiling.  Some residual defects - Continue aspirin    HLD: - Last LDL well-controlled, continue Repatha   Follow up in 3 months  Morene Brownie, MD Advanced Heart Failure Mechanical Circulatory Support 01/05/2025 "

## 2025-01-07 NOTE — H&P (View-Only) (Signed)
 " Electrophysiology Office Note:   Date:  01/11/2025  ID:  Hannah Martinez, DOB [birth year], MRN [id number]  Primary Cardiologist: Evalene Lunger, MD Electrophysiologist: Fonda Kitty, MD      History of Present Illness:   Hannah Martinez is a 69 y.o. female with h/o monic systolic heart failure secondary to ischemic cardiomyopathy, coronary artery disease, cerebral aneurysm/CVA, hyperlipidemia who is being seen today for evaluation for ICD implant at the request of Dr. Zenaida.  Discussed the use of AI scribe software for clinical note transcription with the patient, who gave verbal consent to proceed.  History of Present Illness Hannah Martinez is a 69 year old female with heart failure who presents for evaluation of a defibrillator placement. She was referred by Dr. Zenaida for evaluation of a defibrillator placement.  She has a history of heart failure with a reduced ejection fraction of 25%. Despite this, she feels well and states, 'I feel good.' She reports taking all of her prescribed heart failure medications.  She lives alone and spends a significant amount of time by herself, raising concerns about her safety in the event of a sudden cardiac event. Her family, including her brother and son, are involved in her care.  Her past medical history includes two brain aneurysms and a stroke, all occurring on the same day. She expresses a resilient attitude towards her medical challenges, stating, 'I ain't scared.'  Her son has a history of brain cancer, which adds to the family's medical history of significant neurological conditions.  No additional symptoms reported during the review of systems.   Review of systems complete and found to be negative unless listed in HPI.   EP Information / Studies Reviewed:    EKG is not ordered today. EKG from 12/18/24 reviewed which showed sinus rhythm, PR and QRS 76ms.       Echo 08/23/24:  1. Left ventricular ejection fraction, by  estimation, is 20 to 25%. The  left ventricle has severely decreased function. The left ventricle  demonstrates regional wall motion abnormalities (see scoring  diagram/findings for description). Left ventricular  diastolic parameters are consistent with Grade I diastolic dysfunction  (impaired relaxation). No LV thrombus seen.   2. Right ventricular systolic function is normal. The right ventricular  size is normal.   Zio 12/2023:    LHC 02/25/20:  Ost LAD to Prox LAD lesion is 100% stenosed. Mid Cx lesion is 30% stenosed. 3rd Mrg lesion is 30% stenosed.   1.  Left dominant coronary arteries with chronically occluded ostial LAD with some right to left and left to left collaterals.  Mild disease affecting the left circumflex. 2.  Right heart catheterization showed severely elevated filling pressures with RA pressure of 10 mmHg, pulmonary capillary wedge pressure of 31 mmHg with prominent V wave suggestive of mitral regurgitation, moderate pulmonary hypertension at 55/31 mmHg and mildly reduced cardiac output at 4.56 with a cardiac index of 2.47.  LVEDP was 34 mmHg.        Physical Exam:   VS:  BP 128/72 (BP Location: Left Arm, Patient Position: Sitting, Cuff Size: Normal)   Pulse 66   Ht 5' 5 (1.651 m)   Wt 190 lb 6.4 oz (86.4 kg)   SpO2 97%   BMI 31.68 kg/m    Wt Readings from Last 3 Encounters:  01/08/25 190 lb 6.4 oz (86.4 kg)  01/03/25 189 lb 6 oz (85.9 kg)  12/18/24 196 lb 6.4 oz (89.1 kg)     General: Well  developed, in no acute distress.  Neck: No JVD.  Cardiac: Normal rate, regular rhythm.  Resp: Normal work of breathing.  Ext: No edema.  Neuro: No gross focal deficits.  Psych: Normal affect.   ASSESSMENT AND PLAN:    #Chronic systolic heart failure: NYHA class II. EF 20-25%. Narrow QRS.  #Ischemic cardiomyopathy - Patient meets criteria prevention ICD in the setting of LVEF less than 35% despite greater than 90 days of guideline directed medical therapy.  Explained risks, benefits, and alternatives to ICD implantation, including but not limited to bleeding, infection, damage to heart or lungs, heart attack, stroke, or death.  Pt verbalized understanding and elected to proceed. - Continue GDMT and follow up with HF team.   #CAD: Denies chest pain.  -Continue aspirin , Repatha , ezetimibe , and rosuvastatin .    Follow up with EP Team 3 months after implant.  Signed, Fonda Kitty, MD  "

## 2025-01-07 NOTE — Progress Notes (Signed)
 " Electrophysiology Office Note:   Date:  01/11/2025  ID:  Hannah Martinez, DOB [birth year], MRN [id number]  Primary Cardiologist: Evalene Lunger, MD Electrophysiologist: Fonda Kitty, MD      History of Present Illness:   Hannah Martinez is a 69 y.o. female with h/o monic systolic heart failure secondary to ischemic cardiomyopathy, coronary artery disease, cerebral aneurysm/CVA, hyperlipidemia who is being seen today for evaluation for ICD implant at the request of Dr. Zenaida.  Discussed the use of AI scribe software for clinical note transcription with the patient, who gave verbal consent to proceed.  History of Present Illness Hannah Martinez is a 69 year old female with heart failure who presents for evaluation of a defibrillator placement. She was referred by Dr. Zenaida for evaluation of a defibrillator placement.  She has a history of heart failure with a reduced ejection fraction of 25%. Despite this, she feels well and states, 'I feel good.' She reports taking all of her prescribed heart failure medications.  She lives alone and spends a significant amount of time by herself, raising concerns about her safety in the event of a sudden cardiac event. Her family, including her brother and son, are involved in her care.  Her past medical history includes two brain aneurysms and a stroke, all occurring on the same day. She expresses a resilient attitude towards her medical challenges, stating, 'I ain't scared.'  Her son has a history of brain cancer, which adds to the family's medical history of significant neurological conditions.  No additional symptoms reported during the review of systems.   Review of systems complete and found to be negative unless listed in HPI.   EP Information / Studies Reviewed:    EKG is not ordered today. EKG from 12/18/24 reviewed which showed sinus rhythm, PR and QRS 76ms.       Echo 08/23/24:  1. Left ventricular ejection fraction, by  estimation, is 20 to 25%. The  left ventricle has severely decreased function. The left ventricle  demonstrates regional wall motion abnormalities (see scoring  diagram/findings for description). Left ventricular  diastolic parameters are consistent with Grade I diastolic dysfunction  (impaired relaxation). No LV thrombus seen.   2. Right ventricular systolic function is normal. The right ventricular  size is normal.   Zio 12/2023:    LHC 02/25/20:  Ost LAD to Prox LAD lesion is 100% stenosed. Mid Cx lesion is 30% stenosed. 3rd Mrg lesion is 30% stenosed.   1.  Left dominant coronary arteries with chronically occluded ostial LAD with some right to left and left to left collaterals.  Mild disease affecting the left circumflex. 2.  Right heart catheterization showed severely elevated filling pressures with RA pressure of 10 mmHg, pulmonary capillary wedge pressure of 31 mmHg with prominent V wave suggestive of mitral regurgitation, moderate pulmonary hypertension at 55/31 mmHg and mildly reduced cardiac output at 4.56 with a cardiac index of 2.47.  LVEDP was 34 mmHg.        Physical Exam:   VS:  BP 128/72 (BP Location: Left Arm, Patient Position: Sitting, Cuff Size: Normal)   Pulse 66   Ht 5' 5 (1.651 m)   Wt 190 lb 6.4 oz (86.4 kg)   SpO2 97%   BMI 31.68 kg/m    Wt Readings from Last 3 Encounters:  01/08/25 190 lb 6.4 oz (86.4 kg)  01/03/25 189 lb 6 oz (85.9 kg)  12/18/24 196 lb 6.4 oz (89.1 kg)     General: Well  developed, in no acute distress.  Neck: No JVD.  Cardiac: Normal rate, regular rhythm.  Resp: Normal work of breathing.  Ext: No edema.  Neuro: No gross focal deficits.  Psych: Normal affect.   ASSESSMENT AND PLAN:    #Chronic systolic heart failure: NYHA class II. EF 20-25%. Narrow QRS.  #Ischemic cardiomyopathy - Patient meets criteria prevention ICD in the setting of LVEF less than 35% despite greater than 90 days of guideline directed medical therapy.  Explained risks, benefits, and alternatives to ICD implantation, including but not limited to bleeding, infection, damage to heart or lungs, heart attack, stroke, or death.  Pt verbalized understanding and elected to proceed. - Continue GDMT and follow up with HF team.   #CAD: Denies chest pain.  -Continue aspirin , Repatha , ezetimibe , and rosuvastatin .    Follow up with EP Team 3 months after implant.  Signed, Fonda Kitty, MD  "

## 2025-01-08 ENCOUNTER — Other Ambulatory Visit: Payer: Self-pay

## 2025-01-08 ENCOUNTER — Ambulatory Visit: Attending: Cardiology | Admitting: Cardiology

## 2025-01-08 ENCOUNTER — Encounter: Payer: Self-pay | Admitting: Cardiology

## 2025-01-08 VITALS — BP 128/72 | HR 66 | Ht 65.0 in | Wt 190.4 lb

## 2025-01-08 DIAGNOSIS — I255 Ischemic cardiomyopathy: Secondary | ICD-10-CM | POA: Diagnosis not present

## 2025-01-08 DIAGNOSIS — I2510 Atherosclerotic heart disease of native coronary artery without angina pectoris: Secondary | ICD-10-CM

## 2025-01-08 DIAGNOSIS — I25118 Atherosclerotic heart disease of native coronary artery with other forms of angina pectoris: Secondary | ICD-10-CM

## 2025-01-08 DIAGNOSIS — I639 Cerebral infarction, unspecified: Secondary | ICD-10-CM

## 2025-01-08 DIAGNOSIS — I5020 Unspecified systolic (congestive) heart failure: Secondary | ICD-10-CM

## 2025-01-08 DIAGNOSIS — I5042 Chronic combined systolic (congestive) and diastolic (congestive) heart failure: Secondary | ICD-10-CM

## 2025-01-08 DIAGNOSIS — R0602 Shortness of breath: Secondary | ICD-10-CM

## 2025-01-08 DIAGNOSIS — R002 Palpitations: Secondary | ICD-10-CM

## 2025-01-08 NOTE — Patient Instructions (Signed)
 Medication Instructions:  Your physician recommends that you continue on your current medications as directed. Please refer to the Current Medication list given to you today.  *If you need a refill on your cardiac medications before your next appointment, please call your pharmacy*  Lab Work: BMET and CBC   Testing/Procedures: ICD Implant  Your physician has recommended that you have a defibrillator inserted. An implantable cardioverter defibrillator (ICD) is a small device that is placed in your chest or, in rare cases, your abdomen. This device uses electrical pulses or shocks to help control life-threatening, irregular heartbeats that could lead the heart to suddenly stop beating (sudden cardiac arrest). Leads are attached to the ICD that goes into your heart. This is done in the hospital and usually requires an overnight stay. Please see the instruction sheet given to you today for more information.   Follow-Up: At [facility], you and your health needs are our priority.  As part of our continuing mission to provide you with exceptional heart care, our providers are all part of one team.  This team includes your primary Cardiologist (physician) and Advanced Practice Providers or APPs (Physician Assistants and Nurse Practitioners) who all work together to provide you with the care you need, when you need it.  Your next appointment:   We will contact you to schedule your post-procedure follow up appointments

## 2025-01-09 ENCOUNTER — Telehealth: Payer: Self-pay

## 2025-01-09 LAB — BASIC METABOLIC PANEL WITH GFR
BUN/Creatinine Ratio: 9 — ABNORMAL LOW (ref 12–28)
BUN: 8 mg/dL (ref 8–27)
CO2: 21 mmol/L (ref 20–29)
Calcium: 9.8 mg/dL (ref 8.7–10.3)
Chloride: 107 mmol/L — ABNORMAL HIGH (ref 96–106)
Creatinine, Ser: 0.91 mg/dL (ref 0.57–1.00)
Glucose: 115 mg/dL — ABNORMAL HIGH (ref 70–99)
Potassium: 4.2 mmol/L (ref 3.5–5.2)
Sodium: 143 mmol/L (ref 134–144)
eGFR: 69 mL/min/1.73

## 2025-01-09 LAB — CBC WITH DIFFERENTIAL/PLATELET
Basophils Absolute: 0 x10E3/uL (ref 0.0–0.2)
Basos: 0 %
EOS (ABSOLUTE): 0.1 x10E3/uL (ref 0.0–0.4)
Eos: 1 %
Hematocrit: 47.6 % — ABNORMAL HIGH (ref 34.0–46.6)
Hemoglobin: 15.2 g/dL (ref 11.1–15.9)
Immature Grans (Abs): 0 x10E3/uL (ref 0.0–0.1)
Immature Granulocytes: 0 %
Lymphocytes Absolute: 1.7 x10E3/uL (ref 0.7–3.1)
Lymphs: 37 %
MCH: 33.2 pg — ABNORMAL HIGH (ref 26.6–33.0)
MCHC: 31.9 g/dL (ref 31.5–35.7)
MCV: 104 fL — ABNORMAL HIGH (ref 79–97)
Monocytes Absolute: 0.4 x10E3/uL (ref 0.1–0.9)
Monocytes: 8 %
Neutrophils Absolute: 2.4 x10E3/uL (ref 1.4–7.0)
Neutrophils: 54 %
Platelets: 267 x10E3/uL (ref 150–450)
RBC: 4.58 x10E6/uL (ref 3.77–5.28)
RDW: 12 % (ref 11.7–15.4)
WBC: 4.5 x10E3/uL (ref 3.4–10.8)

## 2025-01-09 NOTE — Telephone Encounter (Signed)
-----   Message from Nurse Doreatha BROCKS, RN sent at 01/08/2025 10:34 AM EST ----- Regarding: 1/27 ICD Implant Precert:  MD: Kennyth Type of implant: ICD Device manufacturer: Medtronic Diagnosis: CHF CPT code: ICD implant - 33249 C-code(s), including quantity (if indicated):  Procedure scheduled (date/time): 1/27 at 3:30pm  Procedure:  Scrub given? Yes  Medication instructions:  Message sent to [facility]? No Added to calendar? Yes Orders entered? Yes Letter complete? Yes Scheduled with cath lab? Yes Labs ordered (CBC, BMET, PT/INR if on warfarin)? Yes Dye allergy? No Pre-meds ordered and instructions given? No, not needed Letter method: given in clinic Special instructions: n/a H&P: 1/13  Follow-up:  Cassie/Angel, please schedule Routine.  Covering RN:  Please send this message to [employer], EP scheduler, EP Scheduling pool, and EP [employer].

## 2025-01-13 ENCOUNTER — Ambulatory Visit: Payer: Self-pay | Admitting: Cardiology

## 2025-01-21 NOTE — Pre-Procedure Instructions (Signed)
 Spoke with patient she plans to come tomorrow.  Instructed patient on the following items: Arrival time 1:30 Nothing to eat or drink after midnight No meds AM of procedure Responsible person to drive you home and stay with you for 24 hrs Wash with special soap night before and morning of procedure

## 2025-01-22 ENCOUNTER — Other Ambulatory Visit (HOSPITAL_COMMUNITY): Payer: Self-pay

## 2025-01-22 ENCOUNTER — Ambulatory Visit (HOSPITAL_COMMUNITY)

## 2025-01-22 ENCOUNTER — Other Ambulatory Visit: Payer: Self-pay

## 2025-01-22 ENCOUNTER — Ambulatory Visit (HOSPITAL_COMMUNITY): Admission: RE | Admit: 2025-01-22 | Admitting: Cardiology

## 2025-01-22 ENCOUNTER — Encounter (HOSPITAL_COMMUNITY)
Admission: RE | Disposition: A | Discharge status: Home / Self Care | Payer: Self-pay | Source: Home / Self Care | Attending: Cardiology

## 2025-01-22 ENCOUNTER — Telehealth: Payer: Self-pay | Admitting: Pharmacy Technician

## 2025-01-22 DIAGNOSIS — Z8679 Personal history of other diseases of the circulatory system: Secondary | ICD-10-CM | POA: Diagnosis not present

## 2025-01-22 DIAGNOSIS — I5022 Chronic systolic (congestive) heart failure: Secondary | ICD-10-CM | POA: Diagnosis present

## 2025-01-22 DIAGNOSIS — Z8673 Personal history of transient ischemic attack (TIA), and cerebral infarction without residual deficits: Secondary | ICD-10-CM | POA: Insufficient documentation

## 2025-01-22 DIAGNOSIS — I255 Ischemic cardiomyopathy: Secondary | ICD-10-CM

## 2025-01-22 DIAGNOSIS — I2510 Atherosclerotic heart disease of native coronary artery without angina pectoris: Secondary | ICD-10-CM | POA: Insufficient documentation

## 2025-01-22 DIAGNOSIS — Z7982 Long term (current) use of aspirin: Secondary | ICD-10-CM | POA: Diagnosis not present

## 2025-01-22 DIAGNOSIS — Z79899 Other long term (current) drug therapy: Secondary | ICD-10-CM | POA: Insufficient documentation

## 2025-01-22 MED ORDER — MIDAZOLAM HCL 2 MG/2ML IJ SOLN
INTRAMUSCULAR | Status: AC
  Filled 2025-01-22: qty 2

## 2025-01-22 MED ORDER — FENTANYL CITRATE (PF) 100 MCG/2ML IJ SOLN
INTRAMUSCULAR | Status: DC | PRN
  Administered 2025-01-22: 50 ug via INTRAVENOUS
  Administered 2025-01-22: 25 ug via INTRAVENOUS

## 2025-01-22 MED ORDER — ACETAMINOPHEN 325 MG PO TABS: 325.0000 mg | ORAL_TABLET | ORAL | Status: DC | PRN

## 2025-01-22 MED ORDER — CEFAZOLIN SODIUM-DEXTROSE 2-4 GM/100ML-% IV SOLN
INTRAVENOUS | Status: AC
  Filled 2025-01-22: qty 100

## 2025-01-22 MED ORDER — SODIUM CHLORIDE 0.9 % IV SOLN
INTRAVENOUS | Status: AC
  Filled 2025-01-22: qty 2

## 2025-01-22 MED ORDER — SODIUM CHLORIDE 0.9 % IV SOLN
80.0000 mg | INTRAVENOUS | Status: AC
  Administered 2025-01-22: 80 mg

## 2025-01-22 MED ORDER — LIDOCAINE HCL (PF) 1 % IJ SOLN
INTRAMUSCULAR | Status: AC
  Filled 2025-01-22: qty 60

## 2025-01-22 MED ORDER — LIDOCAINE HCL (PF) 1 % IJ SOLN
INTRAMUSCULAR | Status: DC | PRN
  Administered 2025-01-22: 60 mL

## 2025-01-22 MED ORDER — MIDAZOLAM HCL 5 MG/5ML IJ SOLN
INTRAMUSCULAR | Status: DC | PRN
  Administered 2025-01-22: 1 mg via INTRAVENOUS
  Administered 2025-01-22: .5 mg via INTRAVENOUS

## 2025-01-22 MED ORDER — FENTANYL CITRATE (PF) 100 MCG/2ML IJ SOLN
INTRAMUSCULAR | Status: AC
  Filled 2025-01-22: qty 2

## 2025-01-22 MED ORDER — CEFAZOLIN SODIUM-DEXTROSE 2-4 GM/100ML-% IV SOLN
2.0000 g | INTRAVENOUS | Status: AC
  Administered 2025-01-22: 2 g via INTRAVENOUS

## 2025-01-22 MED ORDER — HEPARIN (PORCINE) IN NACL 1000-0.9 UT/500ML-% IV SOLN
INTRAVENOUS | Status: DC | PRN
  Administered 2025-01-22: 500 mL

## 2025-01-22 MED ORDER — CHLORHEXIDINE GLUCONATE 4 % EX SOLN
4.0000 | Freq: Once | CUTANEOUS | Status: DC
  Filled 2025-01-22: qty 60

## 2025-01-22 MED ORDER — POVIDONE-IODINE 10 % EX SWAB
2.0000 | Freq: Once | CUTANEOUS | Status: AC
  Administered 2025-01-22: 2 via TOPICAL

## 2025-01-22 MED ORDER — SODIUM CHLORIDE 0.9 % IV SOLN: INTRAVENOUS | Status: DC

## 2025-01-22 MED ORDER — ONDANSETRON HCL 4 MG/2ML IJ SOLN: 4.0000 mg | Freq: Four times a day (QID) | INTRAMUSCULAR | Status: DC | PRN

## 2025-01-22 NOTE — Interval H&P Note (Signed)
 History and Physical Interval Note:  01/22/2025 3:09 PM  Hannah Martinez  has presented today for surgery, with the diagnosis of chronic systolic heart failure.  The various methods of treatment have been discussed with the patient and family. After consideration of risks, benefits and other options for treatment, the patient has consented to  Procedures: ICD IMPLANT (N/A) as a surgical intervention.  The patient's history has been reviewed, patient examined, no change in status, stable for surgery.  I have reviewed the patient's chart and labs.  Questions were answered to the patient's satisfaction.     Fonda Kitty

## 2025-01-22 NOTE — Telephone Encounter (Signed)
 Pharmacy Patient Advocate Encounter  Received notification from [employer] that Prior Authorization for repatha  has been APPROVED from 01/22/25 to 12/26/25   PA #/Case ID/Reference #: [id number]

## 2025-01-22 NOTE — Progress Notes (Signed)
 Pt and brother received discharge instructions, teach back performed. Iv removed, no complications. Left chest site is clean dry intact, no signs of bleeding. Pt escorted out via wheelchair to brother's vehicle.

## 2025-01-22 NOTE — Discharge Instructions (Signed)
 After Your ICD (Implantable Cardiac Defibrillator)   You have a [employer] ICD  If you have a Medtronic or Biotronik device, plug in your home monitor once you get home, and no manual interaction is required.   If you have an [employer] or [employer] device, plug your home monitor once you get home, sit near the device, and press the large activation button. Sit nearby until the process is complete, usually notated by lights on the monitor.   If you were set up for monitoring using an app on your phone, make sure the app remains open in the background and the Bluetooth remains on.  ACTIVITY Do not lift your arm above shoulder height for 1 week after your procedure. After 7 days, you may progress as below.  You should remove your sling 24 hours after your procedure, unless otherwise instructed by your provider.     Tuesday January 29, 2025  Wednesday January 30, 2025 Thursday January 31, 2025 Friday February 01, 2025   Do not lift, push, pull, or carry anything over 10 pounds with the affected arm until 6 weeks (Tuesday March 05, 2025 ) after your procedure.   You may drive AFTER your wound check, UNLESS you have been told otherwise by your provider.   Ask your healthcare provider when you can go back to work   INCISION/Dressing If you are on a blood thinner such as Coumadin, Xarelto, Eliquis, Plavix, or Pradaxa please confirm with your provider when this should be resumed.   If large square, outer bandage is left in place, this can be removed after 24 hours from your procedure. Do not remove steri-strips or glue as below.   Monitor your defibrillator site for redness, swelling, and drainage. Call the device clinic at [phone] if you experience these symptoms or fever/chills.    If your incision is sealed with Steri-strips or staples, you may shower 7 days after your procedure or when told by your provider. Do not remove the steri-strips or let the shower hit directly on  your site. You may wash around your site with soap and water.    If you were discharged in a sling, please do not wear this during the day more than 48 hours after your surgery unless otherwise instructed. This may increase the risk of stiffness and soreness in your shoulder.   Avoid lotions, ointments, or perfumes over your incision until it is well-healed.  You may use a hot tub or a pool AFTER your wound check appointment if the incision is completely closed.  Your ICD is designed to protect you from life threatening heart rhythms. Because of this, you may receive a shock.   1 shock with no symptoms:  Call the office during business hours. 1 shock with symptoms (chest pain, chest pressure, dizziness, lightheadedness, shortness of breath, overall feeling unwell):  Call 911. If you experience 2 or more shocks in 24 hours:  Call 911. If you receive a shock, you should not drive for 6 months per the [place] DMV IF you receive appropriate therapy from your ICD.   ICD Alerts:  Some alerts are vibratory and others beep. These are NOT emergencies. Please call our office to let us  know. If this occurs at night or on weekends, it can wait until the next business day. Send a remote transmission.  If your device is capable of reading fluid status (for heart failure), you will be offered monthly monitoring to review this with you.   DEVICE  MANAGEMENT Remote monitoring is used to monitor your ICD from home. This monitoring is scheduled every 91 days by our office. It allows us  to keep an eye on the functioning of your device to ensure it is working properly. You will routinely see your Electrophysiologist annually (more often if necessary). This will appear as a REMOTE check on your MyChart schedule. These are automatic and there is nothing for you to manually do unless otherwise instructed.  You should receive your ID card for your new device in 4-8 weeks. Keep this card with you at all times once received.  Consider wearing a medical alert bracelet or necklace.  Your ICD  may be MRI compatible. This will be discussed at your next office visit/wound check.  You should avoid contact with strong electric or magnetic fields.   Do not use amateur (ham) radio equipment or electric (arc) welding torches. MP3 player headphones with magnets should not be used. Some devices are safe to use if held at least 12 inches (30 cm) from your defibrillator. These include power tools, lawn mowers, and speakers. If you are unsure if something is safe to use, ask your health care provider.  When using your cell phone, hold it to the ear that is on the opposite side from the defibrillator. Do not leave your cell phone in a pocket over the defibrillator.  You may safely use electric blankets, heating pads, computers, and microwave ovens.  Call the office right away if: You have chest pain. You feel more than one shock. You feel more short of breath than you have felt before. You feel more light-headed than you have felt before. Your incision starts to open up.  This information is not intended to replace advice given to you by your health care provider. Make sure you discuss any questions you have with your health care provider.

## 2025-01-22 NOTE — Telephone Encounter (Signed)
" °  New ins   Pharmacy Patient Advocate Encounter   Received notification from [employer] that prior authorization for REPATHA  is required/requested.   Insurance verification completed.   The patient is insured through [employer].   Per test claim: PA required; PA submitted to above mentioned insurance via Latent Key/confirmation #/EOC [id number] Status is pending  "

## 2025-01-23 ENCOUNTER — Telehealth: Payer: Self-pay | Admitting: Cardiology

## 2025-01-23 ENCOUNTER — Encounter (HOSPITAL_COMMUNITY): Payer: Self-pay | Admitting: Cardiology

## 2025-01-23 MED FILL — Midazolam HCl Inj 2 MG/2ML (Base Equivalent): INTRAMUSCULAR | Qty: 1.5 | Status: AC

## 2025-01-23 NOTE — Telephone Encounter (Signed)
 Spoke with the patient who states that she has been nauseous all night. She tried to eat an orange and drink some water but was unable to keep it down. She did call EMS who came out and evaluated her. She states that everything checked out okay but they offered to take her to the hospital. She declined. She states that she is having some discomfort at her incision site but denies any extreme pain. Advised that some discomfort was normal and that she could take some tylenol . She denies any signs of infection. She wants to know if she can have a prescription for anti-nausea medications.

## 2025-01-23 NOTE — Telephone Encounter (Signed)
" ° °  Patient called in reporting severe pain at the site of her ICD implant that was placed yesterday. She tells me that the site is still covered with dressing so she is not entirely sure if it is swollen or red but she does not believe it is. She is requesting something be sent in for pain management. I discussed the importance of having someone evaluate her implant site if she is having severe pain. We discussed possibly coming in for a wound care appointment in the office. She told me that she will plan on calling 911 to come to the ER for further workup because she is in severe pain.   Waddell DELENA Donath, PA-C 01/23/2025 8:02 AM  "

## 2025-01-23 NOTE — Telephone Encounter (Signed)
 Pt calling regarding feeling nausea and having pain due to procedure yesterday. Pt states if it her pain gets worst she plan to go to the ER. Please advice

## 2025-01-24 ENCOUNTER — Encounter: Payer: Self-pay | Admitting: Emergency Medicine

## 2025-01-24 ENCOUNTER — Telehealth: Payer: Self-pay | Admitting: Cardiology

## 2025-01-24 NOTE — Telephone Encounter (Signed)
 Spoke with the patient who states that her pain is much better today. She is not having anymore nausea. She will continue to monitor and call us  back if she has any other questions/concerns.

## 2025-01-24 NOTE — Telephone Encounter (Signed)
 Spoke with the patient and reviewed her post-procedure instructions. Patient verbalized understanding. She also states that she is feeling much better. Pain has improved. She is no longer nauseous.

## 2025-01-24 NOTE — Telephone Encounter (Signed)
 Patient is requesting for a call back to go over AVS from the 01/08/25 appt with Dr Kennyth.

## 2025-01-28 ENCOUNTER — Telehealth: Payer: Self-pay | Admitting: Cardiology

## 2025-01-28 NOTE — Telephone Encounter (Signed)
 Attempted to call patient. Unable to leave voicemail.

## 2025-01-29 ENCOUNTER — Telehealth: Payer: Self-pay | Admitting: Cardiology

## 2025-01-29 DIAGNOSIS — E785 Hyperlipidemia, unspecified: Secondary | ICD-10-CM

## 2025-01-29 DIAGNOSIS — I25118 Atherosclerotic heart disease of native coronary artery with other forms of angina pectoris: Secondary | ICD-10-CM

## 2025-01-29 MED ORDER — REPATHA SURECLICK 140 MG/ML ~~LOC~~ SOAJ: 140.0000 mg | SUBCUTANEOUS | 3 refills | Status: AC

## 2025-01-29 NOTE — Telephone Encounter (Signed)
" °*  STAT* If patient is at the pharmacy, call can be transferred to refill team.   1. Which medications need to be refilled? (please list name of each medication and dose if known)   Evolocumab  (REPATHA  SURECLICK) 140 MG/ML SOAJ     4. Which pharmacy/location (including street and city if local pharmacy) is medication to be sent to? [id number] - [place], [place] - [address]     5. Do they need a 30 day or 90 day supply? 90   "

## 2025-01-30 NOTE — Telephone Encounter (Signed)
 Pt reporting she needs Repatha  Rx sent in. Aware Rx was sent in yesterday by Dr. Gollan. She appreciates the call and information, she will go pick up Rx today.

## 2025-02-05 ENCOUNTER — Ambulatory Visit

## 2025-03-05 ENCOUNTER — Ambulatory Visit

## 2025-04-24 ENCOUNTER — Ambulatory Visit: Admitting: Cardiology

## 2025-06-03 ENCOUNTER — Ambulatory Visit: Admitting: Cardiology

## 2025-06-04 ENCOUNTER — Ambulatory Visit

## 2025-09-03 ENCOUNTER — Ambulatory Visit

## 2025-12-03 ENCOUNTER — Ambulatory Visit

## 2026-03-04 ENCOUNTER — Ambulatory Visit
